# Patient Record
Sex: Male | Born: 1945 | Race: Black or African American | Hispanic: No | Marital: Married | Smoking: Former smoker
Health system: Southern US, Community
[De-identification: ages and names within clinical notes are randomized; demographics above are authoritative.]

## PROBLEM LIST (undated history)

## (undated) DIAGNOSIS — K922 Gastrointestinal hemorrhage, unspecified: Secondary | ICD-10-CM

## (undated) DIAGNOSIS — N179 Acute kidney failure, unspecified: Secondary | ICD-10-CM

## (undated) DIAGNOSIS — I4891 Unspecified atrial fibrillation: Secondary | ICD-10-CM

## (undated) DIAGNOSIS — R55 Syncope and collapse: Secondary | ICD-10-CM

## (undated) DIAGNOSIS — D649 Anemia, unspecified: Secondary | ICD-10-CM

## (undated) DIAGNOSIS — I10 Essential (primary) hypertension: Secondary | ICD-10-CM

## (undated) DIAGNOSIS — Z9289 Personal history of other medical treatment: Secondary | ICD-10-CM

## (undated) DIAGNOSIS — K7460 Unspecified cirrhosis of liver: Secondary | ICD-10-CM

## (undated) DIAGNOSIS — Z87898 Personal history of other specified conditions: Secondary | ICD-10-CM

## (undated) DIAGNOSIS — R0600 Dyspnea, unspecified: Secondary | ICD-10-CM

## (undated) DIAGNOSIS — I739 Peripheral vascular disease, unspecified: Secondary | ICD-10-CM

## (undated) DIAGNOSIS — I509 Heart failure, unspecified: Secondary | ICD-10-CM

## (undated) DIAGNOSIS — K920 Hematemesis: Secondary | ICD-10-CM

## (undated) DIAGNOSIS — K219 Gastro-esophageal reflux disease without esophagitis: Secondary | ICD-10-CM

## (undated) HISTORY — PX: COLONOSCOPY: SHX174

## (undated) HISTORY — PX: ESOPHAGOGASTRODUODENOSCOPY: SHX1529

---

## 2014-05-20 DIAGNOSIS — K922 Gastrointestinal hemorrhage, unspecified: Secondary | ICD-10-CM

## 2014-05-20 DIAGNOSIS — Z9289 Personal history of other medical treatment: Secondary | ICD-10-CM

## 2014-05-20 HISTORY — DX: Personal history of other medical treatment: Z92.89

## 2014-05-20 HISTORY — DX: Gastrointestinal hemorrhage, unspecified: K92.2

## 2015-11-21 ENCOUNTER — Emergency Department (HOSPITAL_COMMUNITY): Payer: Medicare Other

## 2015-11-21 ENCOUNTER — Encounter (HOSPITAL_COMMUNITY): Payer: Self-pay | Admitting: Vascular Surgery

## 2015-11-21 ENCOUNTER — Inpatient Hospital Stay (HOSPITAL_COMMUNITY)
Admission: EM | Admit: 2015-11-21 | Discharge: 2015-12-01 | DRG: 248 | Disposition: A | Discharge status: Home / Self Care | Payer: Medicare Other | Attending: Internal Medicine | Admitting: Internal Medicine

## 2015-11-21 ENCOUNTER — Observation Stay (HOSPITAL_COMMUNITY): Payer: Medicare Other

## 2015-11-21 DIAGNOSIS — I10 Essential (primary) hypertension: Secondary | ICD-10-CM | POA: Diagnosis present

## 2015-11-21 DIAGNOSIS — I519 Heart disease, unspecified: Secondary | ICD-10-CM | POA: Diagnosis not present

## 2015-11-21 DIAGNOSIS — F17210 Nicotine dependence, cigarettes, uncomplicated: Secondary | ICD-10-CM | POA: Diagnosis present

## 2015-11-21 DIAGNOSIS — R55 Syncope and collapse: Secondary | ICD-10-CM | POA: Diagnosis present

## 2015-11-21 DIAGNOSIS — E860 Dehydration: Secondary | ICD-10-CM | POA: Diagnosis present

## 2015-11-21 DIAGNOSIS — F1010 Alcohol abuse, uncomplicated: Secondary | ICD-10-CM | POA: Diagnosis present

## 2015-11-21 DIAGNOSIS — I480 Paroxysmal atrial fibrillation: Secondary | ICD-10-CM | POA: Diagnosis present

## 2015-11-21 DIAGNOSIS — E440 Moderate protein-calorie malnutrition: Secondary | ICD-10-CM | POA: Diagnosis present

## 2015-11-21 DIAGNOSIS — K920 Hematemesis: Secondary | ICD-10-CM | POA: Diagnosis not present

## 2015-11-21 DIAGNOSIS — S0101XA Laceration without foreign body of scalp, initial encounter: Secondary | ICD-10-CM | POA: Diagnosis present

## 2015-11-21 DIAGNOSIS — I739 Peripheral vascular disease, unspecified: Secondary | ICD-10-CM | POA: Diagnosis not present

## 2015-11-21 DIAGNOSIS — N179 Acute kidney failure, unspecified: Secondary | ICD-10-CM | POA: Diagnosis present

## 2015-11-21 DIAGNOSIS — R110 Nausea: Secondary | ICD-10-CM | POA: Diagnosis not present

## 2015-11-21 DIAGNOSIS — D500 Iron deficiency anemia secondary to blood loss (chronic): Secondary | ICD-10-CM | POA: Diagnosis present

## 2015-11-21 DIAGNOSIS — I472 Ventricular tachycardia: Secondary | ICD-10-CM | POA: Diagnosis not present

## 2015-11-21 DIAGNOSIS — Z955 Presence of coronary angioplasty implant and graft: Secondary | ICD-10-CM

## 2015-11-21 DIAGNOSIS — I4891 Unspecified atrial fibrillation: Secondary | ICD-10-CM | POA: Diagnosis not present

## 2015-11-21 DIAGNOSIS — K219 Gastro-esophageal reflux disease without esophagitis: Secondary | ICD-10-CM | POA: Diagnosis present

## 2015-11-21 DIAGNOSIS — S0191XA Laceration without foreign body of unspecified part of head, initial encounter: Secondary | ICD-10-CM | POA: Diagnosis present

## 2015-11-21 DIAGNOSIS — E876 Hypokalemia: Secondary | ICD-10-CM | POA: Diagnosis present

## 2015-11-21 DIAGNOSIS — Z6820 Body mass index (BMI) 20.0-20.9, adult: Secondary | ICD-10-CM

## 2015-11-21 DIAGNOSIS — I70219 Atherosclerosis of native arteries of extremities with intermittent claudication, unspecified extremity: Secondary | ICD-10-CM | POA: Diagnosis present

## 2015-11-21 DIAGNOSIS — F17200 Nicotine dependence, unspecified, uncomplicated: Secondary | ICD-10-CM | POA: Diagnosis present

## 2015-11-21 DIAGNOSIS — D72829 Elevated white blood cell count, unspecified: Secondary | ICD-10-CM

## 2015-11-21 DIAGNOSIS — K7030 Alcoholic cirrhosis of liver without ascites: Secondary | ICD-10-CM | POA: Diagnosis present

## 2015-11-21 DIAGNOSIS — I5021 Acute systolic (congestive) heart failure: Secondary | ICD-10-CM | POA: Diagnosis present

## 2015-11-21 DIAGNOSIS — I509 Heart failure, unspecified: Secondary | ICD-10-CM

## 2015-11-21 DIAGNOSIS — I110 Hypertensive heart disease with heart failure: Secondary | ICD-10-CM | POA: Diagnosis present

## 2015-11-21 DIAGNOSIS — Z7982 Long term (current) use of aspirin: Secondary | ICD-10-CM | POA: Diagnosis not present

## 2015-11-21 DIAGNOSIS — Z9861 Coronary angioplasty status: Secondary | ICD-10-CM

## 2015-11-21 DIAGNOSIS — I25110 Atherosclerotic heart disease of native coronary artery with unstable angina pectoris: Principal | ICD-10-CM | POA: Diagnosis present

## 2015-11-21 DIAGNOSIS — R609 Edema, unspecified: Secondary | ICD-10-CM

## 2015-11-21 DIAGNOSIS — K7460 Unspecified cirrhosis of liver: Secondary | ICD-10-CM | POA: Diagnosis present

## 2015-11-21 DIAGNOSIS — W1830XA Fall on same level, unspecified, initial encounter: Secondary | ICD-10-CM | POA: Diagnosis present

## 2015-11-21 DIAGNOSIS — I70213 Atherosclerosis of native arteries of extremities with intermittent claudication, bilateral legs: Secondary | ICD-10-CM | POA: Diagnosis present

## 2015-11-21 DIAGNOSIS — I2510 Atherosclerotic heart disease of native coronary artery without angina pectoris: Secondary | ICD-10-CM | POA: Diagnosis present

## 2015-11-21 DIAGNOSIS — K2970 Gastritis, unspecified, without bleeding: Secondary | ICD-10-CM | POA: Diagnosis present

## 2015-11-21 DIAGNOSIS — K3189 Other diseases of stomach and duodenum: Secondary | ICD-10-CM | POA: Diagnosis present

## 2015-11-21 DIAGNOSIS — I255 Ischemic cardiomyopathy: Secondary | ICD-10-CM | POA: Diagnosis present

## 2015-11-21 DIAGNOSIS — K279 Peptic ulcer, site unspecified, unspecified as acute or chronic, without hemorrhage or perforation: Secondary | ICD-10-CM | POA: Diagnosis present

## 2015-11-21 HISTORY — DX: Gastrointestinal hemorrhage, unspecified: K92.2

## 2015-11-21 HISTORY — DX: Syncope and collapse: R55

## 2015-11-21 HISTORY — DX: Unspecified atrial fibrillation: I48.91

## 2015-11-21 HISTORY — DX: Essential (primary) hypertension: I10

## 2015-11-21 HISTORY — DX: Hematemesis: K92.0

## 2015-11-21 HISTORY — DX: Personal history of other medical treatment: Z92.89

## 2015-11-21 HISTORY — DX: Personal history of other specified conditions: Z87.898

## 2015-11-21 HISTORY — DX: Gastro-esophageal reflux disease without esophagitis: K21.9

## 2015-11-21 HISTORY — DX: Peripheral vascular disease, unspecified: I73.9

## 2015-11-21 HISTORY — DX: Unspecified cirrhosis of liver: K74.60

## 2015-11-21 HISTORY — DX: Acute kidney failure, unspecified: N17.9

## 2015-11-21 LAB — HEPATIC FUNCTION PANEL
ALT: 26 U/L (ref 17–63)
AST: 41 U/L (ref 15–41)
Albumin: 3.4 g/dL — ABNORMAL LOW (ref 3.5–5.0)
Alkaline Phosphatase: 100 U/L (ref 38–126)
BILIRUBIN INDIRECT: 1.3 mg/dL — AB (ref 0.3–0.9)
Bilirubin, Direct: 0.3 mg/dL (ref 0.1–0.5)
TOTAL PROTEIN: 7.1 g/dL (ref 6.5–8.1)
Total Bilirubin: 1.6 mg/dL — ABNORMAL HIGH (ref 0.3–1.2)

## 2015-11-21 LAB — BASIC METABOLIC PANEL
Anion gap: 12 (ref 5–15)
BUN: 9 mg/dL (ref 6–20)
CALCIUM: 9.2 mg/dL (ref 8.9–10.3)
CO2: 24 mmol/L (ref 22–32)
CREATININE: 2.28 mg/dL — AB (ref 0.61–1.24)
Chloride: 100 mmol/L — ABNORMAL LOW (ref 101–111)
GFR calc Af Amer: 32 mL/min — ABNORMAL LOW (ref >60)
GFR, EST NON AFRICAN AMERICAN: 27 mL/min — AB (ref >60)
Glucose, Bld: 122 mg/dL — ABNORMAL HIGH (ref 65–99)
POTASSIUM: 3.3 mmol/L — AB (ref 3.5–5.1)
SODIUM: 136 mmol/L (ref 135–145)

## 2015-11-21 LAB — OCCULT BLOOD X 1 CARD TO LAB, STOOL: FECAL OCCULT BLD: POSITIVE — AB

## 2015-11-21 LAB — I-STAT TROPONIN, ED: TROPONIN I, POC: 0.02 ng/mL (ref 0.00–0.08)

## 2015-11-21 LAB — T4, FREE: FREE T4: 0.87 ng/dL (ref 0.61–1.12)

## 2015-11-21 LAB — CBC
HEMATOCRIT: 46.2 % (ref 39.0–52.0)
Hemoglobin: 15.8 g/dL (ref 13.0–17.0)
MCH: 33.4 pg (ref 26.0–34.0)
MCHC: 34.2 g/dL (ref 30.0–36.0)
MCV: 97.7 fL (ref 78.0–100.0)
PLATELETS: 214 10*3/uL (ref 150–400)
RBC: 4.73 MIL/uL (ref 4.22–5.81)
RDW: 13 % (ref 11.5–15.5)
WBC: 13.1 10*3/uL — AB (ref 4.0–10.5)

## 2015-11-21 LAB — URINE MICROSCOPIC-ADD ON

## 2015-11-21 LAB — URINALYSIS, ROUTINE W REFLEX MICROSCOPIC
Glucose, UA: 100 mg/dL — AB
Hgb urine dipstick: NEGATIVE
KETONES UR: 15 mg/dL — AB
NITRITE: POSITIVE — AB
PROTEIN: 100 mg/dL — AB
Specific Gravity, Urine: 1.026 (ref 1.005–1.030)
pH: 5 (ref 5.0–8.0)

## 2015-11-21 LAB — TROPONIN I: TROPONIN I: 0.03 ng/mL — AB (ref <0.03)

## 2015-11-21 LAB — TSH: TSH: 0.996 u[IU]/mL (ref 0.350–4.500)

## 2015-11-21 LAB — PROTIME-INR
INR: 1.15 (ref 0.00–1.49)
PROTHROMBIN TIME: 14.8 s (ref 11.6–15.2)

## 2015-11-21 LAB — APTT: APTT: 30 s (ref 24–37)

## 2015-11-21 MED ORDER — BOOST / RESOURCE BREEZE PO LIQD
1.0000 | Freq: Three times a day (TID) | ORAL | Status: DC
  Administered 2015-11-22 – 2015-12-01 (×19): 1 via ORAL
  Filled 2015-11-21 (×6): qty 1

## 2015-11-21 MED ORDER — THIAMINE HCL 100 MG/ML IJ SOLN: 100.0000 mg | Freq: Every day | INTRAMUSCULAR | Status: DC

## 2015-11-21 MED ORDER — SODIUM CHLORIDE 0.9 % IV SOLN
INTRAVENOUS | Status: DC
  Administered 2015-11-21: 20:00:00 via INTRAVENOUS

## 2015-11-21 MED ORDER — PANTOPRAZOLE SODIUM 40 MG PO TBEC
40.0000 mg | DELAYED_RELEASE_TABLET | Freq: Two times a day (BID) | ORAL | Status: DC
  Administered 2015-11-21 – 2015-12-01 (×20): 40 mg via ORAL
  Filled 2015-11-21 (×20): qty 1

## 2015-11-21 MED ORDER — ASPIRIN 81 MG PO CHEW
324.0000 mg | CHEWABLE_TABLET | Freq: Once | ORAL | Status: AC
  Administered 2015-11-21: 324 mg via ORAL
  Filled 2015-11-21: qty 4

## 2015-11-21 MED ORDER — LORAZEPAM 1 MG PO TABS: 1.0000 mg | ORAL_TABLET | Freq: Four times a day (QID) | ORAL | Status: AC | PRN

## 2015-11-21 MED ORDER — LORAZEPAM 2 MG/ML IJ SOLN
1.0000 mg | Freq: Four times a day (QID) | INTRAMUSCULAR | Status: AC | PRN
  Administered 2015-11-24: 1 mg via INTRAVENOUS
  Filled 2015-11-21 (×2): qty 1

## 2015-11-21 MED ORDER — ACETAMINOPHEN 500 MG PO TABS
1000.0000 mg | ORAL_TABLET | Freq: Once | ORAL | Status: AC
Start: 2015-11-21 — End: 2015-11-21
  Administered 2015-11-21: 1000 mg via ORAL
  Filled 2015-11-21: qty 2

## 2015-11-21 MED ORDER — VITAMIN B-1 100 MG PO TABS
100.0000 mg | ORAL_TABLET | Freq: Every day | ORAL | Status: DC
  Administered 2015-11-21 – 2015-12-01 (×11): 100 mg via ORAL
  Filled 2015-11-21 (×11): qty 1

## 2015-11-21 MED ORDER — METOPROLOL TARTRATE 25 MG PO TABS
25.0000 mg | ORAL_TABLET | Freq: Two times a day (BID) | ORAL | Status: DC
  Administered 2015-11-21 – 2015-11-26 (×10): 25 mg via ORAL
  Filled 2015-11-21 (×10): qty 1

## 2015-11-21 MED ORDER — POTASSIUM CHLORIDE CRYS ER 20 MEQ PO TBCR
40.0000 meq | EXTENDED_RELEASE_TABLET | Freq: Once | ORAL | Status: AC
  Administered 2015-11-21: 40 meq via ORAL
  Filled 2015-11-21: qty 2

## 2015-11-21 MED ORDER — ADULT MULTIVITAMIN W/MINERALS CH
1.0000 | ORAL_TABLET | Freq: Every day | ORAL | Status: DC
  Administered 2015-11-21 – 2015-12-01 (×11): 1 via ORAL
  Filled 2015-11-21 (×11): qty 1

## 2015-11-21 MED ORDER — AMLODIPINE BESYLATE 10 MG PO TABS
10.0000 mg | ORAL_TABLET | Freq: Every day | ORAL | Status: DC
  Administered 2015-11-22 – 2015-12-01 (×9): 10 mg via ORAL
  Filled 2015-11-21 (×10): qty 1

## 2015-11-21 MED ORDER — LIDOCAINE-EPINEPHRINE 1 %-1:100000 IJ SOLN
20.0000 mL | Freq: Once | INTRAMUSCULAR | Status: AC
  Administered 2015-11-21: 20 mL via INTRADERMAL
  Filled 2015-11-21: qty 1

## 2015-11-21 MED ORDER — ONDANSETRON HCL 4 MG/2ML IJ SOLN: 4.0000 mg | Freq: Four times a day (QID) | INTRAMUSCULAR | Status: DC | PRN

## 2015-11-21 MED ORDER — FOLIC ACID 1 MG PO TABS
1.0000 mg | ORAL_TABLET | Freq: Every day | ORAL | Status: DC
  Administered 2015-11-21 – 2015-12-01 (×11): 1 mg via ORAL
  Filled 2015-11-21 (×11): qty 1

## 2015-11-21 MED ORDER — ACETAMINOPHEN 325 MG PO TABS
650.0000 mg | ORAL_TABLET | ORAL | Status: DC | PRN
  Administered 2015-11-22 – 2015-11-23 (×3): 650 mg via ORAL
  Filled 2015-11-21 (×3): qty 2

## 2015-11-21 MED ORDER — SODIUM CHLORIDE 0.9 % IV BOLUS (SEPSIS)
1000.0000 mL | Freq: Once | INTRAVENOUS | Status: AC
  Administered 2015-11-21: 1000 mL via INTRAVENOUS

## 2015-11-21 NOTE — ED Notes (Signed)
Pt reports to the ED for eval of multiple syncopal episodes. Pt was walking home from the grocery store when he had a syncopal episode outside of his apt. He fell to the ground striking his posterior head which resulted in a 10 mc laceration. Dressing in place and bleeding controlled at this time. Upon EMS arrival patient was pale and diaphoretic. He initially refused transport however when they stood him up he had another syncopal episode. He was caught and slowly lowered to the ground. Pts wife states N/V/D x the past several days. As well as some decreased PO intake. Pt complaining of some neck pain en route and towel in place to hold alignment. Pt denies any numbness, tingling, or paralysis. Was incontient of stool en route. 12 lead showed a fib. Pt denies any blood thinner use. Pt A&Ox4, resp e/u, and skin warm and dry.

## 2015-11-21 NOTE — H&P (Signed)
History and Physical    Derrick Ramos ZOX:[id number] DOB: April 17, 1946 DOA: 11/21/2015  PCP: The patient has a PCP in the [facility] system  Patient coming from: Home  Chief Complaint: Recurrent syncope, head laceration  HPI: Derrick Ramos is a 70 y.o. gentleman with a history of cirrhosis (self-reported), daily EtOH use, active tobacco use, PVD, and HTN who presents to the ED accompanied by his wife and daughter who has had recurrent syncopal episodes.  The episode today resulted in head trauma and laceration requiring staples in the ED.  EKG also shows atrial fibrillation (rate controlled), which is new for him.  He denies chest pain, pressure, or tightness.  No shortness of breath.  He has had nausea and vomiting and reports dark coffee ground emesis over the past two days.  He has had 3-4 episodes.  He did not tell his family. He denies melena but says that his stools have been loose.  No abdominal pain, fullness, or new distention.  No leg swelling.    ED Course: Staples placed in head laceration.  CT of head and cervical spine did not show acute abnormalities that would be attributed to his head trauma.  He has evidence of mild hypokalemia, a leukocytosis of unclear clinical significance, and elevated creatinine (though BUN is normal).  Hospitalist asked to admit.  Review of Systems: As per HPI otherwise 10 point review of systems negative.    Past Medical History  Diagnosis Date  . Hypertension   . 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 Colonoscopy    . Esophagogastroduodenoscopy       reports that he has been smoking Cigarettes.  He has been smoking about 0.50 packs per day. He has never used smokeless tobacco. He reports that he drinks alcohol. He reports that he does not use illicit drugs. Daily  tobacco and EtOH use. Denies history of EtOH withdrawal symptoms. Denies illicit drug use. Relocated to this area from [place] almost two years ago. He is married and has at least one daughter.  No Known Allergies  Family History: One sister has DM and CKD. Mother died of complications related to cancer; type unknown. One brother died of complications related to cancer; type unknown.  Prior to Admission medications   Medication Sig Start Date End Date Taking? Authorizing Provider  amLODipine (NORVASC) 10 MG tablet Take 1 tablet by mouth daily. 10/12/15  Yes Historical Provider, MD  loratadine (CLARITIN) 10 MG tablet Take 1 tablet by mouth daily. 10/12/15 01/10/16 Yes Historical Provider, MD  metoprolol tartrate (LOPRESSOR) 25 MG tablet Take 1 tablet by mouth 2 (two) times daily. 10/12/15  Yes Historical Provider, MD  ranitidine (ZANTAC) 150 MG capsule Take 1 capsule by mouth 2 (two) times daily.   Yes Historical Provider, MD    Physical Exam: Filed Vitals:   11/21/15 1645 11/21/15 1700 11/21/15 1715 11/21/15 1749  BP: 126/77 118/78 108/87 128/85  Pulse: 73 78 79 66  Temp:    98.3 F (36.8 C)  TempSrc:    Oral  Resp: 14 17 16 18   Height:    5\' 6"  (1.676 m)  Weight:    56.3 kg (124 lb 1.9 oz)  SpO2: 100% 100% 100% 100%      Constitutional: NAD, calm, comfortable Filed Vitals:   11/21/15 1645 11/21/15 1700 11/21/15  1715 11/21/15 1749  BP: 126/77 118/78 108/87 128/85  Pulse: 73 78 79 66  Temp:    98.3 F (36.8 C)  TempSrc:    Oral  Resp: Height:     (1.676 m)  Weight:    56.3 kg (124 lb 1.9 oz)  SpO2: 100% 100% 100% 100%   Eyes: PERRL, lids and conjunctivae normal ENMT: Mucous membranes are dry. Posterior pharynx clear of any exudate or lesions. Neck: normal appearance, supple, no masses Respiratory: clear to auscultation bilaterally, no wheezing, no crackles. Normal respiratory effort. No accessory muscle use.  Cardiovascular: Irregularly irregular but  rate controlled.  No murmurs / rubs / gallops. No extremity edema. 2+ pedal pulses. No carotid bruits.  GI: abdomen is soft and compressible.  Mildly distended but he says this is his baseline.  No tenderness.  No guarding.  No masses palpated.  Bowel sounds are present. Musculoskeletal:  No joint deformity in upper and lower extremities. Good ROM, no contractures. Normal muscle tone.  Skin: no rashes, warm and dry Neurologic: No focal deficits. Psychiatric: Normal judgment and insight. Alert and oriented x 3. Normal mood.     Labs on Admission: I have personally reviewed following labs and imaging studies  CBC:  Recent Labs Lab 11/21/15 1422  WBC 13.1*  HGB 15.8  HCT 46.2  MCV 97.7  PLT 214   Basic Metabolic Panel:  Recent Labs Lab 11/21/15 1422  NA 136  K 3.3*  CL 100*  CO2 24  GLUCOSE 122*  BUN 9  CREATININE 2.28*  CALCIUM 9.2   GFR: Estimated Creatinine Clearance: 23.7 mL/min (by C-G formula based on Cr of 2.28).   Radiological Exams on Admission: Dg Chest 2 View  11/21/2015  CLINICAL DATA:  Multiple syncopal episodes. Patient fell, striking head. Initial encounter. EXAM: CHEST  2 VIEW COMPARISON:  None. FINDINGS: 1513 hours. The heart is mildly enlarged. There is aortic tortuosity. The lungs are mildly hyperinflated but clear. There is some calcification along the right hemidiaphragm. No pleural effusion or pneumothorax is seen. Mild degenerative changes are present throughout the spine. IMPRESSION: No active cardiopulmonary process or acute posttraumatic findings demonstrated. Mild cardiomegaly. Electronically Signed   By: Carey Bullocks M.D.   On: 11/21/2015 15:12   Ct Head Wo Contrast  11/21/2015  CLINICAL DATA:  Fall, neck pain EXAM: CT HEAD WITHOUT CONTRAST CT CERVICAL SPINE WITHOUT CONTRAST TECHNIQUE: Multidetector CT imaging of the head and cervical spine was performed following the standard protocol without intravenous contrast. Multiplanar CT image  reconstructions of the cervical spine were also generated. COMPARISON:  None. FINDINGS: CT HEAD FINDINGS No skull fracture is noted. Paranasal sinuses and mastoid air cells are unremarkable. Atherosclerotic calcifications of carotid siphon are noted. No intracranial hemorrhage, mass effect or midline shift. Moderate cerebral atrophy. Mild periventricular and patchy subcortical white matter decreased attenuation probable due to chronic small vessel ischemic changes. No acute cortical infarction. No mass lesion is noted on this unenhanced scan. CT CERVICAL SPINE FINDINGS Axial images of the cervical spine shows no acute fracture or subluxation. Atherosclerotic calcifications of vertebral arteries are noted. Computer processed images shows no acute fracture or subluxation. Mild degenerative changes C1-C2 articulation. Mild posterior disc bulge at C3-C4 level. Mild anterior spurring lower endplate of C4 and C5 vertebral body. Mild disc space flattening with minimal posterior disc bulge as at C6-C7 level. No prevertebral soft tissue swelling. Cervical airway is patent. There is no pneumothorax in visualized lung  apices. IMPRESSION: 1. No acute intracranial abnormality. There is moderate cerebral atrophy. Mild periventricular and patchy subcortical white matter decreased attenuation probable due to chronic small vessel ischemic changes. 2. No cervical spine acute fracture or subluxation. Mild degenerative changes as described above. Electronically Signed   By: Natasha Mead M.D.   On: 11/21/2015 15:09   Ct Cervical Spine Wo Contrast  11/21/2015  CLINICAL DATA:  Fall, neck pain EXAM: CT HEAD WITHOUT CONTRAST CT CERVICAL SPINE WITHOUT CONTRAST TECHNIQUE: Multidetector CT imaging of the head and cervical spine was performed following the standard protocol without intravenous contrast. Multiplanar CT image reconstructions of the cervical spine were also generated. COMPARISON:  None. FINDINGS: CT HEAD FINDINGS No skull fracture  is noted. Paranasal sinuses and mastoid air cells are unremarkable. Atherosclerotic calcifications of carotid siphon are noted. No intracranial hemorrhage, mass effect or midline shift. Moderate cerebral atrophy. Mild periventricular and patchy subcortical white matter decreased attenuation probable due to chronic small vessel ischemic changes. No acute cortical infarction. No mass lesion is noted on this unenhanced scan. CT CERVICAL SPINE FINDINGS Axial images of the cervical spine shows no acute fracture or subluxation. Atherosclerotic calcifications of vertebral arteries are noted. Computer processed images shows no acute fracture or subluxation. Mild degenerative changes C1-C2 articulation. Mild posterior disc bulge at C3-C4 level. Mild anterior spurring lower endplate of C4 and C5 vertebral body. Mild disc space flattening with minimal posterior disc bulge as at C6-C7 level. No prevertebral soft tissue swelling. Cervical airway is patent. There is no pneumothorax in visualized lung apices. IMPRESSION: 1. No acute intracranial abnormality. There is moderate cerebral atrophy. Mild periventricular and patchy subcortical white matter decreased attenuation probable due to chronic small vessel ischemic changes. 2. No cervical spine acute fracture or subluxation. Mild degenerative changes as described above. Electronically Signed   By: Natasha Mead M.D.   On: 11/21/2015 15:09    EKG: Independently reviewed. Rate controlled atrial fibrillation.  Assessment/Plan Principal Problem:   Syncope and collapse Active Problems:   Atrial fibrillation (HCC)   Hypokalemia   HTN (hypertension)   Hepatic cirrhosis (HCC)   Hematemesis   AKI (acute kidney injury) (HCC)   Laceration of head   Tobacco use disorder   Alcohol abuse   Syncope      Syncope in the setting of AKI, new onset atrial fibrillation, self-reported coffee ground emesis (with history of GI bleed) and daily EtOH use (likely contributing to  relative dehydration) --Monitor on telemetry --Will check carotid dopplers since he has a history of PVD --Document orthostatic vital signs --Gentle hydration  Atrial fibrillation, CHADS-Vasc score at least 3 for age, PAD, and HTN.  Anticoagulation deferred at this point due to concern for occult GI bleeding.  Candidacy for long term anticoagulation will need to be assessed (active EtOH use, concerned about medical compliance, history of GI bleeding). --Monitor on telemetry --Serial troponin --Thyroid function tests --Complete echo in the AM --Will need cardiology referral  AKI --Hydrate and repeat BMP in the AM --Renal ultrasound --U/A pending  HTN --Continue amlodipine, metoprolol for now  Self-reported coffee ground emesis with history of GI bleeding --Hgb normal upon presentation (though this could be some component of hemoconcentration) --Will monitor H/H trend after hydration --Monitor for signs of active blood loss --Oral BID PPI for now --Stool heme occult --Clear liquid diet for now --Anticipate GI consult for the AM  Self-reported history of cirrhosis --Will check LFTs and coags now  Daily tobacco use --Smoking cessation counseling  Daily EtOH use --CIWA protocol --MVI, thiamine, folate supplements    DVT prophylaxis: SCDs due to risk for bleeding Code Status: FULL Family Communication: Wife and daughter present in the ED at time of admission Disposition Plan: Home when ready for discharge Consults called: NONE Admission status: Observation, telemetry  TIME SPENT: 60 minutes  Jerene Bears MD [facility] Pager [phone]  If 7PM-7AM, please contact night-coverage [email or web address] Password [email or web address]  11/21/2015, 6:08 PM

## 2015-11-21 NOTE — ED Provider Notes (Signed)
CSN: [id number]     Arrival date & time 11/21/15  1354 History   First MD Initiated Contact with Patient 11/21/15 1356     Chief Complaint  Patient presents with  . Loss of Consciousness     (Consider location/radiation/quality/duration/timing/severity/associated sxs/prior Treatment) Patient is a 70 y.o. male presenting with syncope. The history is provided by the patient.  Loss of Consciousness Episode history:  Multiple Most recent episode:  Today Duration:  2 seconds Timing:  Constant Progression:  Unchanged Chronicity:  New Context: exertion and standing up   Witnessed: yes   Relieved by:  Lying down Worsened by:  Nothing tried Ineffective treatments:  None tried Associated symptoms: no chest pain, no confusion, no fever, no headaches, no palpitations, no shortness of breath and no vomiting    70 yo M With a chief complaint of syncopal BP. This happened twice a day. The first event was exertional he was walking to his front door when he suddenly dropped. He does not remember any presyncopal events. When he awoke to EMS he tried at home that he didn't need to come to the hospital when he tried to stand he then passed out again. This was a witnessed event and he does not endorse any chest pain shortness breath or headache prior to the events. He did hit his head when he fell during the first syncopal event. He has a history of hypertension and peripheral vascular disease but denies other medical history.  Past Medical History  Diagnosis Date  . Hypertension   . GERD (gastroesophageal reflux disease)    Past Surgical History  Procedure Laterality Date  . Colonoscopy    . Esophagogastroduodenoscopy     No family history on file. Social History  Substance Use Topics  . Smoking status: Current Every Day Smoker -- 0.50 packs/day    Types: Cigarettes  . Smokeless tobacco: Never Used  . Alcohol Use: Yes     Comment: daily, 2 - 6 pack tall cans daily beer and gallon wine 2-3 days     Review of Systems  Constitutional: Negative for fever and chills.  HENT: Negative for congestion and facial swelling.   Eyes: Negative for discharge and visual disturbance.  Respiratory: Negative for shortness of breath.   Cardiovascular: Positive for syncope. Negative for chest pain and palpitations.  Gastrointestinal: Negative for vomiting, abdominal pain and diarrhea.  Musculoskeletal: Negative for myalgias and arthralgias.  Skin: Negative for color change and rash.  Neurological: Positive for syncope. Negative for tremors and headaches.  Psychiatric/Behavioral: Negative for confusion and dysphoric mood.      Allergies  Review of patient's allergies indicates not on file.  Home Medications   Prior to Admission medications   Not on File   BP 135/89 mmHg  Pulse 87  Temp(Src) 98.2 F (36.8 C) (Oral)  Resp 21  SpO2 99% Physical Exam  Constitutional: He is oriented to person, place, and time. He appears well-developed and well-nourished.  HENT:  Head: Normocephalic.  8cm lac to posterior occiput  Eyes: EOM are normal. Pupils are equal, round, and reactive to light.  Neck: Normal range of motion. Neck supple. No JVD present.  Cardiovascular: Normal rate and regular rhythm.  Exam reveals no gallop and no friction rub.   No murmur heard. Pulmonary/Chest: No respiratory distress. He has no wheezes.  Abdominal: He exhibits no distension. There is no tenderness. There is no rebound and no guarding.  Musculoskeletal: Normal range of motion.  Neurological: He is alert  and oriented to person, place, and time.  Skin: No rash noted. No pallor.  Psychiatric: He has a normal mood and affect. His behavior is normal.  Nursing note and vitals reviewed.   ED Course  Procedures (including critical care time) Labs Review Labs Reviewed  BASIC METABOLIC PANEL - Abnormal; Notable for the following:    Potassium 3.3 (*)    Chloride 100 (*)    Glucose, Bld 122 (*)    Creatinine,  Ser 2.28 (*)    GFR calc non Af Amer 27 (*)    GFR calc Af Amer 32 (*)    All other components within normal limits  CBC - Abnormal; Notable for the following:    WBC 13.1 (*)    All other components within normal limits  URINALYSIS, ROUTINE W REFLEX MICROSCOPIC (NOT AT [facility])  I-STAT TROPOININ, ED    Imaging Review No results found. I have personally reviewed and evaluated these images and lab results as part of my medical decision-making.   EKG Interpretation   Date/Time:  Tuesday November 21 2015 14:06:13 EDT Ventricular Rate:  80 PR Interval:    QRS Duration: 92 QT Interval:  398 QTC Calculation: 460 R Axis:   -34 Text Interpretation:  Atrial fibrillation Inferior infarct, old Probable  anterior infarct, age indeterminate No old tracing to compare Confirmed by  Curlee Bogan MD, DANIEL [phone]) on 11/21/2015 2:13:24 PM      MDM   Final diagnoses:  Syncope and collapse    70 yo M with a chief complaint of a syncopal event. This was exertional. Patient has a large laceration to his occiput that I will repair with staples. ECG with flipped T waves in inferior and lateral leads. Patient is new to our system and has no old EKGs. Will admit.  The patients results and plan were reviewed and discussed.   Any x-rays performed were independently reviewed by myself.   Differential diagnosis were considered with the presenting HPI.  Medications  amLODipine (NORVASC) tablet 10 mg (not administered)  metoprolol tartrate (LOPRESSOR) tablet 25 mg (25 mg Oral Given 11/21/15 2138)  pantoprazole (PROTONIX) EC tablet 40 mg (40 mg Oral Given 11/21/15 2138)  acetaminophen (TYLENOL) tablet 650 mg (650 mg Oral Given 11/22/15 0549)  ondansetron (ZOFRAN) injection 4 mg (not administered)  0.9 %  sodium chloride infusion ( Intravenous New Bag/Given 11/21/15 1942)  LORazepam (ATIVAN) tablet 1 mg (not administered)    Or  LORazepam (ATIVAN) injection 1 mg (not administered)  thiamine (VITAMIN B-1) tablet 100 mg  (100 mg Oral Given 11/21/15 2002)    Or  thiamine (B-1) injection 100 mg ( Intravenous See Alternative 11/21/15 2002)  folic acid (FOLVITE) tablet 1 mg (1 mg Oral Given 11/21/15 2002)  multivitamin with minerals tablet 1 tablet (1 tablet Oral Given 11/21/15 2001)  feeding supplement (BOOST / RESOURCE BREEZE) liquid 1 Container (not administered)  aspirin chewable tablet 324 mg (324 mg Oral Given 11/21/15 1423)  sodium chloride 0.9 % bolus 1,000 mL (0 mLs Intravenous Stopped 11/21/15 1520)  acetaminophen (TYLENOL) tablet 1,000 mg (1,000 mg Oral Given 11/21/15 1521)  lidocaine-EPINEPHrine (XYLOCAINE W/EPI) 1 %-1:100000 (with pres) injection 20 mL (20 mLs Intradermal Given 11/21/15 1521)  potassium chloride SA (K-DUR,KLOR-CON) CR tablet 40 mEq (40 mEq Oral Given 11/21/15 2002)    Filed Vitals:   11/21/15 1749 11/21/15 2157 11/22/15 0043 11/22/15 0621  BP: 128/85 123/83 164/79 146/89  Pulse: 66 101 71 62  Temp: 98.3 F (36.8 C) 98.1  F (36.7 C) 98.9 F (37.2 C) 98.5 F (36.9 C)  TempSrc: Oral Oral Oral Oral  Resp: 18 18 18 18   Height: 5\' 6"  (1.676 m)     Weight: 124 lb 1.9 oz (56.3 kg)   126 lb 15.8 oz (57.6 kg)  SpO2: 100% 100% 100% 100%    Final diagnoses:  Syncope and collapse  Syncope  AKI (acute kidney injury) (HCC)  Syncope  AKI (acute kidney injury) (HCC)     Melene Plan, DO 11/22/15 0175

## 2015-11-21 NOTE — Progress Notes (Signed)
Patient had small liq. Brown stool that was sent to lab earlier to check for blood. Results are postive. Text page Maren Reamer N.P. To make aware.

## 2015-11-22 ENCOUNTER — Observation Stay (HOSPITAL_BASED_OUTPATIENT_CLINIC_OR_DEPARTMENT_OTHER): Payer: Medicare Other

## 2015-11-22 ENCOUNTER — Observation Stay (HOSPITAL_COMMUNITY): Payer: Medicare Other

## 2015-11-22 DIAGNOSIS — I472 Ventricular tachycardia: Secondary | ICD-10-CM | POA: Diagnosis not present

## 2015-11-22 DIAGNOSIS — F17210 Nicotine dependence, cigarettes, uncomplicated: Secondary | ICD-10-CM | POA: Diagnosis not present

## 2015-11-22 DIAGNOSIS — E440 Moderate protein-calorie malnutrition: Secondary | ICD-10-CM | POA: Diagnosis not present

## 2015-11-22 DIAGNOSIS — Z6820 Body mass index (BMI) 20.0-20.9, adult: Secondary | ICD-10-CM | POA: Diagnosis not present

## 2015-11-22 DIAGNOSIS — K7030 Alcoholic cirrhosis of liver without ascites: Secondary | ICD-10-CM | POA: Diagnosis not present

## 2015-11-22 DIAGNOSIS — K2970 Gastritis, unspecified, without bleeding: Secondary | ICD-10-CM | POA: Diagnosis not present

## 2015-11-22 DIAGNOSIS — E860 Dehydration: Secondary | ICD-10-CM | POA: Diagnosis not present

## 2015-11-22 DIAGNOSIS — I25110 Atherosclerotic heart disease of native coronary artery with unstable angina pectoris: Secondary | ICD-10-CM | POA: Diagnosis not present

## 2015-11-22 DIAGNOSIS — R55 Syncope and collapse: Secondary | ICD-10-CM | POA: Diagnosis present

## 2015-11-22 DIAGNOSIS — K3189 Other diseases of stomach and duodenum: Secondary | ICD-10-CM | POA: Diagnosis not present

## 2015-11-22 DIAGNOSIS — N179 Acute kidney failure, unspecified: Secondary | ICD-10-CM | POA: Diagnosis not present

## 2015-11-22 DIAGNOSIS — W1830XA Fall on same level, unspecified, initial encounter: Secondary | ICD-10-CM | POA: Diagnosis not present

## 2015-11-22 DIAGNOSIS — I4891 Unspecified atrial fibrillation: Secondary | ICD-10-CM | POA: Diagnosis not present

## 2015-11-22 DIAGNOSIS — I5021 Acute systolic (congestive) heart failure: Secondary | ICD-10-CM | POA: Diagnosis not present

## 2015-11-22 DIAGNOSIS — E876 Hypokalemia: Secondary | ICD-10-CM | POA: Diagnosis not present

## 2015-11-22 DIAGNOSIS — I255 Ischemic cardiomyopathy: Secondary | ICD-10-CM | POA: Diagnosis not present

## 2015-11-22 DIAGNOSIS — Z7982 Long term (current) use of aspirin: Secondary | ICD-10-CM | POA: Diagnosis not present

## 2015-11-22 DIAGNOSIS — F1010 Alcohol abuse, uncomplicated: Secondary | ICD-10-CM | POA: Diagnosis not present

## 2015-11-22 DIAGNOSIS — S0101XA Laceration without foreign body of scalp, initial encounter: Secondary | ICD-10-CM | POA: Diagnosis not present

## 2015-11-22 DIAGNOSIS — I110 Hypertensive heart disease with heart failure: Secondary | ICD-10-CM | POA: Diagnosis not present

## 2015-11-22 DIAGNOSIS — D500 Iron deficiency anemia secondary to blood loss (chronic): Secondary | ICD-10-CM | POA: Diagnosis not present

## 2015-11-22 DIAGNOSIS — K219 Gastro-esophageal reflux disease without esophagitis: Secondary | ICD-10-CM | POA: Diagnosis not present

## 2015-11-22 DIAGNOSIS — I480 Paroxysmal atrial fibrillation: Secondary | ICD-10-CM | POA: Diagnosis not present

## 2015-11-22 DIAGNOSIS — I70213 Atherosclerosis of native arteries of extremities with intermittent claudication, bilateral legs: Secondary | ICD-10-CM | POA: Diagnosis not present

## 2015-11-22 LAB — HEMOGLOBIN AND HEMATOCRIT, BLOOD
HCT: 42.8 % (ref 39.0–52.0)
HEMATOCRIT: 42.5 % (ref 39.0–52.0)
HEMOGLOBIN: 14.2 g/dL (ref 13.0–17.0)
Hemoglobin: 14.2 g/dL (ref 13.0–17.0)

## 2015-11-22 LAB — COMPREHENSIVE METABOLIC PANEL
ALBUMIN: 3 g/dL — AB (ref 3.5–5.0)
ALK PHOS: 95 U/L (ref 38–126)
ALT: 23 U/L (ref 17–63)
ANION GAP: 8 (ref 5–15)
AST: 31 U/L (ref 15–41)
BUN: 9 mg/dL (ref 6–20)
CALCIUM: 8.9 mg/dL (ref 8.9–10.3)
CO2: 26 mmol/L (ref 22–32)
Chloride: 105 mmol/L (ref 101–111)
Creatinine, Ser: 1.21 mg/dL (ref 0.61–1.24)
GFR calc Af Amer: >60 mL/min (ref >60)
GFR calc non Af Amer: 58 mL/min — ABNORMAL LOW (ref >60)
GLUCOSE: 84 mg/dL (ref 65–99)
POTASSIUM: 4.6 mmol/L (ref 3.5–5.1)
SODIUM: 139 mmol/L (ref 135–145)
Total Bilirubin: 1.7 mg/dL — ABNORMAL HIGH (ref 0.3–1.2)
Total Protein: 6.5 g/dL (ref 6.5–8.1)

## 2015-11-22 LAB — ECHOCARDIOGRAM COMPLETE
CHL CUP STROKE VOLUME: 40 mL
FS: 25 % — AB (ref 28–44)
HEIGHTINCHES: 66 in
IV/PV OW: 0.99
LA ID, A-P, ES: 34 mm
LA vol A4C: 57.6 ml
LA vol: 63.3 mL
LADIAMINDEX: 2.06 cm/m2
LAVOLIN: 38.4 mL/m2
LEFT ATRIUM END SYS DIAM: 34 mm
LV PW d: 9.52 mm — AB (ref 0.6–1.1)
LV SIMPSON'S DISK: 44
LV dias vol: 93 mL (ref 62–150)
LV sys vol index: 32 mL/m2
LVDIAVOLIN: 56 mL/m2
LVOT area: 3.46 cm2
LVOT diameter: 21 mm
LVSYSVOL: 52 mL (ref 21–61)
TAPSE: 21.7 mm
WEIGHTICAEL: 2031.76 [oz_av]

## 2015-11-22 LAB — HEMOGLOBIN A1C
Hgb A1c MFr Bld: 4.7 % — ABNORMAL LOW (ref 4.8–5.6)
MEAN PLASMA GLUCOSE: 88 mg/dL

## 2015-11-22 LAB — CBC
HCT: 43.4 % (ref 39.0–52.0)
HEMOGLOBIN: 14.5 g/dL (ref 13.0–17.0)
MCH: 32.5 pg (ref 26.0–34.0)
MCHC: 33.4 g/dL (ref 30.0–36.0)
MCV: 97.3 fL (ref 78.0–100.0)
Platelets: 196 10*3/uL (ref 150–400)
RBC: 4.46 MIL/uL (ref 4.22–5.81)
RDW: 13.2 % (ref 11.5–15.5)
WBC: 10.4 10*3/uL (ref 4.0–10.5)

## 2015-11-22 LAB — TROPONIN I
TROPONIN I: 0.03 ng/mL — AB (ref <0.03)
TROPONIN I: 0.04 ng/mL — AB (ref <0.03)

## 2015-11-22 NOTE — Progress Notes (Signed)
Progress Note    Derrick Ramos  ZOX:[id number] DOB: 1946/04/16  DOA: 11/21/2015 PCP: Pcp Not In System    Brief Narrative:   Derrick Ramos is an 70 y.o. male gentleman with a history of cirrhosis (self-reported), daily EtOH use, active tobacco use, PVD, and HTN who presents to the ED accompanied by his wife and daughter who has had recurrent syncopal episodes.   Assessment/Plan:   Principal Problem:   Syncope and collapse Active Problems:   Atrial fibrillation (HCC)   Hypokalemia   HTN (hypertension)   Hepatic cirrhosis (HCC)   Hematemesis   AKI (acute kidney injury) (HCC)   Laceration of head   Tobacco use disorder   Alcohol abuse   Syncope   Malnutrition of moderate degree  Syncope possibly from ETOH abuse: ? Coffee ground emesis.  Hemoglobin stable.  GI consulted for further eval.    Paroxysmal atrial fibrillation: Serial troponins Echo pending.  [place] score is 3.  But because of his ETOH abuse, not a good candidate for anticoagulation.     Aki: Improving with IV fluids.  Continue the IV fluids.  Recheck renal parameters in am.    Hypertension: well controlled.    Cirrhosis: Liver function panel wnl.    etoh abuse: On CIWA protocol.   Left rib pain: Rib series ordered.  Pain control .  Avoid NSAIDS.     Family Communication/Anticipated D/C date and plan/Code Status   DVT prophylaxis: Lovenox ordered. Code Status: Full Code.  Family Communication: none at bedside.  Disposition Plan: pending further investigation.   Medical Consultants:    Gastroenterology.    Procedures:    Anti-Infectives:   Anti-infectives    None      Subjective:    Derrick Ramos reports pain in the left rib area.   Objective:    Filed Vitals:   11/22/15 0621 11/22/15 1041 11/22/15 1200 11/22/15 1744  BP: 146/89 130/83 142/77 143/85  Pulse: 62 89 73 82  Temp: 98.5 F (36.9 C)  98.7 F (37.1 C) 98.5 F (36.9 C)  TempSrc: Oral  Oral Oral    Resp: 18  18 18   Height:      Weight: 57.6 kg (126 lb 15.8 oz)     SpO2: 100%  99% 100%    Intake/Output Summary (Last 24 hours) at 11/22/15 1748 Last data filed at 11/22/15 1745  Gross per 24 hour  Intake   1080 ml  Output    625 ml  Net    455 ml   Filed Weights   11/21/15 1749 11/22/15 0621  Weight: 56.3 kg (124 lb 1.9 oz) 57.6 kg (126 lb 15.8 oz)    Exam: General exam: Appears calm and comfortable.  Respiratory system: Clear to auscultation. Respiratory effort normal. Cardiovascular system: S1 & S2 heard, RRR. No JVD,  rubs, gallops or clicks. No murmurs. Gastrointestinal system:tender abdomen, soft, non distended. Pain in the left rib area Central nervous system: Alert and oriented. No focal neurological deficits. Extremities: No clubbing, edema, or cyanosis. Skin: No rashes, lesions or ulcers Psychiatry: Judgement and insight appear normal. Mood & affect appropriate.   Data Reviewed:   I have personally reviewed following labs and imaging studies:  Labs: Basic Metabolic Panel:  Recent Labs Lab 11/21/15 1422 11/22/15 0537  NA 136 139  K 3.3* 4.6  CL 100* 105  CO2 24 26  GLUCOSE 122* 84  BUN 9 9  CREATININE 2.28* 1.21  CALCIUM 9.2 8.9  GFR Estimated Creatinine Clearance: 45.6 mL/min (by C-G formula based on Cr of 1.21). Liver Function Tests:  Recent Labs Lab 11/21/15 1818 11/22/15 0537  AST 41 31  ALT 26 23  ALKPHOS 100 95  BILITOT 1.6* 1.7*  PROT 7.1 6.5  ALBUMIN 3.4* 3.0*   No results for input(s): LIPASE, AMYLASE in the last 168 hours. No results for input(s): AMMONIA in the last 168 hours. Coagulation profile  Recent Labs Lab 11/21/15 1831  INR 1.15    CBC:  Recent Labs Lab 11/21/15 1422 11/21/15 2334 11/22/15 0537  WBC 13.1*  --  10.4  HGB 15.8 14.2 14.5  HCT 46.2 42.8 43.4  MCV 97.7  --  97.3  PLT 214  --  196   Cardiac Enzymes:  Recent Labs Lab 11/21/15 1818 11/21/15 2331 11/22/15 0537  TROPONINI 0.03* 0.03*  0.04*   BNP (last 3 results) No results for input(s): PROBNP in the last 8760 hours. CBG: No results for input(s): GLUCAP in the last 168 hours. D-Dimer: No results for input(s): DDIMER in the last 72 hours. Hgb A1c:  Recent Labs  11/21/15 1823  HGBA1C 4.7*   Lipid Profile: No results for input(s): CHOL, HDL, LDLCALC, TRIG, CHOLHDL, LDLDIRECT in the last 72 hours. Thyroid function studies:  Recent Labs  11/21/15 1819  TSH 0.996   Anemia work up: No results for input(s): VITAMINB12, FOLATE, FERRITIN, TIBC, IRON, RETICCTPCT in the last 72 hours. Sepsis Labs:  Recent Labs Lab 11/21/15 1422 11/22/15 0537  WBC 13.1* 10.4   Urine analysis:    Component Value Date/Time   COLORURINE ORANGE* 11/21/2015 2148   APPEARANCEUR TURBID* 11/21/2015 2148   LABSPEC 1.026 11/21/2015 2148   PHURINE 5.0 11/21/2015 2148   GLUCOSEU 100* 11/21/2015 2148   HGBUR NEGATIVE 11/21/2015 2148   BILIRUBINUR MODERATE* 11/21/2015 2148   KETONESUR 15* 11/21/2015 2148   PROTEINUR 100* 11/21/2015 2148   NITRITE POSITIVE* 11/21/2015 2148   LEUKOCYTESUR SMALL* 11/21/2015 2148   Microbiology No results found for this or any previous visit (from the past 240 hour(s)).  Radiology: Dg Chest 2 View  11/21/2015  CLINICAL DATA:  Multiple syncopal episodes. Patient fell, striking head. Initial encounter. EXAM: CHEST  2 VIEW COMPARISON:  None. FINDINGS: 1513 hours. The heart is mildly enlarged. There is aortic tortuosity. The lungs are mildly hyperinflated but clear. There is some calcification along the right hemidiaphragm. No pleural effusion or pneumothorax is seen. Mild degenerative changes are present throughout the spine. IMPRESSION: No active cardiopulmonary process or acute posttraumatic findings demonstrated. Mild cardiomegaly. Electronically Signed   By: Carey Bullocks M.D.   On: 11/21/2015 15:12   Dg Ribs Unilateral Left  11/22/2015  CLINICAL DATA:  Fall on left side 2 days ago, left lower rib pain  EXAM: LEFT RIBS - 2 VIEW COMPARISON:  11/21/2015 FINDINGS: Two views left ribs submitted. No infiltrate or pulmonary edema. No rib fracture is identified. There is no pneumothorax. IMPRESSION: Negative. Electronically Signed   By: Natasha Mead M.D.   On: 11/22/2015 12:38   Ct Head Wo Contrast  11/21/2015  CLINICAL DATA:  Fall, neck pain EXAM: CT HEAD WITHOUT CONTRAST CT CERVICAL SPINE WITHOUT CONTRAST TECHNIQUE: Multidetector CT imaging of the head and cervical spine was performed following the standard protocol without intravenous contrast. Multiplanar CT image reconstructions of the cervical spine were also generated. COMPARISON:  None. FINDINGS: CT HEAD FINDINGS No skull fracture is noted. Paranasal sinuses and mastoid air cells are unremarkable. Atherosclerotic calcifications of carotid  siphon are noted. No intracranial hemorrhage, mass effect or midline shift. Moderate cerebral atrophy. Mild periventricular and patchy subcortical white matter decreased attenuation probable due to chronic small vessel ischemic changes. No acute cortical infarction. No mass lesion is noted on this unenhanced scan. CT CERVICAL SPINE FINDINGS Axial images of the cervical spine shows no acute fracture or subluxation. Atherosclerotic calcifications of vertebral arteries are noted. Computer processed images shows no acute fracture or subluxation. Mild degenerative changes C1-C2 articulation. Mild posterior disc bulge at C3-C4 level. Mild anterior spurring lower endplate of C4 and C5 vertebral body. Mild disc space flattening with minimal posterior disc bulge as at C6-C7 level. No prevertebral soft tissue swelling. Cervical airway is patent. There is no pneumothorax in visualized lung apices. IMPRESSION: 1. No acute intracranial abnormality. There is moderate cerebral atrophy. Mild periventricular and patchy subcortical white matter decreased attenuation probable due to chronic small vessel ischemic changes. 2. No cervical spine acute  fracture or subluxation. Mild degenerative changes as described above. Electronically Signed   By: Natasha Mead M.D.   On: 11/21/2015 15:09   Ct Cervical Spine Wo Contrast  11/21/2015  CLINICAL DATA:  Fall, neck pain EXAM: CT HEAD WITHOUT CONTRAST CT CERVICAL SPINE WITHOUT CONTRAST TECHNIQUE: Multidetector CT imaging of the head and cervical spine was performed following the standard protocol without intravenous contrast. Multiplanar CT image reconstructions of the cervical spine were also generated. COMPARISON:  None. FINDINGS: CT HEAD FINDINGS No skull fracture is noted. Paranasal sinuses and mastoid air cells are unremarkable. Atherosclerotic calcifications of carotid siphon are noted. No intracranial hemorrhage, mass effect or midline shift. Moderate cerebral atrophy. Mild periventricular and patchy subcortical white matter decreased attenuation probable due to chronic small vessel ischemic changes. No acute cortical infarction. No mass lesion is noted on this unenhanced scan. CT CERVICAL SPINE FINDINGS Axial images of the cervical spine shows no acute fracture or subluxation. Atherosclerotic calcifications of vertebral arteries are noted. Computer processed images shows no acute fracture or subluxation. Mild degenerative changes C1-C2 articulation. Mild posterior disc bulge at C3-C4 level. Mild anterior spurring lower endplate of C4 and C5 vertebral body. Mild disc space flattening with minimal posterior disc bulge as at C6-C7 level. No prevertebral soft tissue swelling. Cervical airway is patent. There is no pneumothorax in visualized lung apices. IMPRESSION: 1. No acute intracranial abnormality. There is moderate cerebral atrophy. Mild periventricular and patchy subcortical white matter decreased attenuation probable due to chronic small vessel ischemic changes. 2. No cervical spine acute fracture or subluxation. Mild degenerative changes as described above. Electronically Signed   By: Natasha Mead M.D.   On:  11/21/2015 15:09   US Renal  11/21/2015  CLINICAL DATA:  Acute kidney injury. EXAM: RENAL / URINARY TRACT ULTRASOUND COMPLETE COMPARISON:  None. FINDINGS: Right Kidney: Length: 11.3 cm. Echogenicity within normal limits. No mass or hydronephrosis visualized. Left Kidney: Length: 11.4 cm. Echogenicity within normal limits. No mass or hydronephrosis visualized. Bladder: Appears normal for degree of bladder distention. IMPRESSION: Normal exam. Electronically Signed   By: Signa Kell M.D.   On: 11/21/2015 19:22    Medications:   . amLODipine  10 mg Oral Daily  . feeding supplement  1 Container Oral TID BM  . folic acid  1 mg Oral Daily  . metoprolol tartrate  25 mg Oral BID  . multivitamin with minerals  1 tablet Oral Daily  . pantoprazole  40 mg Oral BID  . thiamine  100 mg Oral Daily   Or  .  thiamine  100 mg Intravenous Daily   Continuous Infusions: . sodium chloride 75 mL/hr at 11/21/15 1942    Time spent: 30 minutes     Ronneisha Jett  [facility] [phone].  *Please refer to [email or web address], password [email or web address] to get updated schedule on who will round on this patient, as hospitalists switch teams weekly. If 7PM-7AM, please contact night-coverage at [email or web address], password [email or web address] for any overnight needs.  11/22/2015, 5:48 PM

## 2015-11-22 NOTE — Progress Notes (Signed)
  Echocardiogram 2D Echocardiogram has been performed.  Arvil Chaco 11/22/2015, 11:32 AM

## 2015-11-22 NOTE — Consult Note (Signed)
[facility] Consult Note  Referring Provider: No ref. provider found Primary Care Physician:  Pcp Not In System Primary Gastroenterologist:  Dr.  Laurel Dimmer Complaint: Consult for dark emesis and heme positive stool HPI: Derrick Ramos is an 69 y.o. black male  presented with multiple falls and syncope with head laceration. During ER evaluation he reported that 3 and 4 days ago he had some "dark" emesis, none since. He denies any dark stools. He also reported that he was told he has cirrhosis but appears to know little about that other that was diagnosed prior to his moving here from [place] through 4 years ago. He also relates being admitted for GI bleed in [place] in 2016 which is apparently manifest as rectal bleeding. He states he had a colonoscopy and endoscopy but does not know the results. He is a heavy drinker. On presentation here, his hemoglobin, liver function test, platelet count, prothrombin time are all normal. He did have an occult heme positive stool  Past Medical History  Diagnosis Date  . Hypertension   . GERD (gastroesophageal reflux disease)   . Atrial fibrillation ([place])     Archie Endo 11/21/2015  . History of blood transfusion 2016    related to GI bleed/notes 11/21/2015  . Hepatic cirrhosis ([place])     Archie Endo 11/21/2015  . PVD (peripheral vascular disease) ([place])     Archie Endo 11/21/2015  . Syncope and collapse     required staples to back of head/notes 11/21/2015  . Hematemesis     Archie Endo 11/21/2015  . Acute kidney injury ([place])     Archie Endo 11/21/2015  . H/O syncope     recurrent episodes/notes 11/21/2015  . GI bleed 2016    Archie Endo 11/21/2015    Past Surgical History  Procedure Laterality Date  . Colonoscopy    . Esophagogastroduodenoscopy      Medications Prior to Admission  Medication Sig Dispense Refill  . amLODipine (NORVASC) 10 MG tablet Take 1 tablet by mouth daily.    Marland Kitchen loratadine (CLARITIN) 10 MG tablet Take 1 tablet by mouth daily.    . metoprolol tartrate  (LOPRESSOR) 25 MG tablet Take 1 tablet by mouth 2 (two) times daily.    . ranitidine (ZANTAC) 150 MG capsule Take 1 capsule by mouth 2 (two) times daily.      Allergies: No Known Allergies  History reviewed. No pertinent family history.  Social History:  reports that he has been smoking Cigarettes.  He has a 6.6 pack-year smoking history. He has never used smokeless tobacco. He reports that he drinks about 156.6 oz of alcohol per week. He reports that he does not use illicit drugs.  Review of Systems: negative except As above   Blood pressure 130/83, pulse 89, temperature 98.5 F (36.9 C), temperature source Oral, resp. rate 18, height 5' 6"  (1.676 m), weight 57.6 kg (126 lb 15.8 oz), SpO2 100 %. Head: Normocephalic, without obvious abnormality, atraumatic Neck: no adenopathy, no carotid bruit, no JVD, supple, symmetrical, trachea midline and thyroid not enlarged, symmetric, no tenderness/mass/nodules Resp: clear to auscultation bilaterally Cardio: regular rate and rhythm, S1, S2 normal, no murmur, click, rub or gallop GI: Abdomen soft nondistended no organomegaly or shifting dullness. Extremities: extremities normal, atraumatic, no cyanosis or edema  Results for orders placed or performed during the hospital encounter of 11/21/15 (from the past 48 hour(s))  Basic metabolic panel     Status: Abnormal   Collection Time: 11/21/15  2:22 PM  Result Value Ref Range  Sodium 136 135 - 145 mmol/L   Potassium 3.3 (L) 3.5 - 5.1 mmol/L   Chloride 100 (L) 101 - 111 mmol/L   CO2 24 22 - 32 mmol/L   Glucose, Bld 122 (H) 65 - 99 mg/dL   BUN 9 6 - 20 mg/dL   Creatinine, Ser 2.28 (H) 0.61 - 1.24 mg/dL   Calcium 9.2 8.9 - 10.3 mg/dL   GFR calc non Af Amer 27 (L) >60 mL/min   GFR calc Af Amer 32 (L) >60 mL/min    Comment: (NOTE) The eGFR has been calculated using the CKD EPI equation. This calculation has not been validated in all clinical situations. eGFR's persistently <60 mL/min signify  possible Chronic Kidney Disease.    Anion gap 12 5 - 15  CBC     Status: Abnormal   Collection Time: 11/21/15  2:22 PM  Result Value Ref Range   WBC 13.1 (H) 4.0 - 10.5 K/uL   RBC 4.73 4.22 - 5.81 MIL/uL   Hemoglobin 15.8 13.0 - 17.0 g/dL   HCT 46.2 39.0 - 52.0 %   MCV 97.7 78.0 - 100.0 fL   MCH 33.4 26.0 - 34.0 pg   MCHC 34.2 30.0 - 36.0 g/dL   RDW 13.0 11.5 - 15.5 %   Platelets 214 150 - 400 K/uL  I-stat troponin, ED (not at [facility])     Status: None   Collection Time: 11/21/15  4:14 PM  Result Value Ref Range   Troponin i, poc 0.02 0.00 - 0.08 ng/mL   Comment 3            Comment: Due to the release kinetics of cTnI, a negative result within the first hours of the onset of symptoms does not rule out myocardial infarction with certainty. If myocardial infarction is still suspected, repeat the test at appropriate intervals.   T4, free     Status: None   Collection Time: 11/21/15  6:18 PM  Result Value Ref Range   Free T4 0.87 0.61 - 1.12 ng/dL    Comment: (NOTE) Biotin ingestion may interfere with free T4 tests. If the results are inconsistent with the TSH level, previous test results, or the clinical presentation, then consider biotin interference. If needed, order repeat testing after stopping biotin.   Troponin I     Status: Abnormal   Collection Time: 11/21/15  6:18 PM  Result Value Ref Range   Troponin I 0.03 (HH) <0.03 ng/mL    Comment: CRITICAL RESULT CALLED TO, READ BACK BY AND VERIFIED WITH: Cleda Daub RN [phone] [address] R   Hepatic function panel     Status: Abnormal   Collection Time: 11/21/15  6:18 PM  Result Value Ref Range   Total Protein 7.1 6.5 - 8.1 g/dL   Albumin 3.4 (L) 3.5 - 5.0 g/dL   AST 41 15 - 41 U/L   ALT 26 17 - 63 U/L   Alkaline Phosphatase 100 38 - 126 U/L   Total Bilirubin 1.6 (H) 0.3 - 1.2 mg/dL   Bilirubin, Direct 0.3 0.1 - 0.5 mg/dL   Indirect Bilirubin 1.3 (H) 0.3 - 0.9 mg/dL  TSH     Status: None   Collection Time: 11/21/15  6:19  PM  Result Value Ref Range   TSH 0.996 0.350 - 4.500 uIU/mL  Hemoglobin A1c     Status: Abnormal   Collection Time: 11/21/15  6:23 PM  Result Value Ref Range   Hgb A1c MFr Bld 4.7 (L) 4.8 -  5.6 %    Comment: (NOTE)         Pre-diabetes: 5.7 - 6.4         Diabetes: >6.4         Glycemic control for adults with diabetes: <7.0    Mean Plasma Glucose 88 mg/dL    Comment: (NOTE) Performed At: [facility] [place], [place] [id number] Lindon Romp MD OA:[id number]   Protime-INR     Status: None   Collection Time: 11/21/15  6:31 PM  Result Value Ref Range   Prothrombin Time 14.8 11.6 - 15.2 seconds   INR 1.15 0.00 - 1.49  APTT     Status: None   Collection Time: 11/21/15  6:31 PM  Result Value Ref Range   aPTT 30 24 - 37 seconds  Urinalysis, Routine w reflex microscopic     Status: Abnormal   Collection Time: 11/21/15  9:48 PM  Result Value Ref Range   Color, Urine ORANGE (A) YELLOW    Comment: BIOCHEMICALS MAY BE AFFECTED BY COLOR   APPearance TURBID (A) CLEAR   Specific Gravity, Urine 1.026 1.005 - 1.030   pH 5.0 5.0 - 8.0   Glucose, UA 100 (A) NEGATIVE mg/dL   Hgb urine dipstick NEGATIVE NEGATIVE   Bilirubin Urine MODERATE (A) NEGATIVE   Ketones, ur 15 (A) NEGATIVE mg/dL   Protein, ur 100 (A) NEGATIVE mg/dL   Nitrite POSITIVE (A) NEGATIVE   Leukocytes, UA SMALL (A) NEGATIVE  Urine microscopic-add on     Status: Abnormal   Collection Time: 11/21/15  9:48 PM  Result Value Ref Range   Squamous Epithelial / LPF 6-30 (A) NONE SEEN   WBC, UA 0-5 0 - 5 WBC/hpf   RBC / HPF 0-5 0 - 5 RBC/hpf   Bacteria, UA FEW (A) NONE SEEN   Casts HYALINE CASTS (A) NEGATIVE    Comment: GRANULAR CAST  Occult blood card to lab, stool RN will collect     Status: Abnormal   Collection Time: 11/21/15 10:03 PM  Result Value Ref Range   Fecal Occult Bld POSITIVE (A) NEGATIVE  Troponin I     Status: Abnormal   Collection Time: 11/21/15 11:31 PM  Result Value Ref Range    Troponin I 0.03 (HH) <0.03 ng/mL    Comment: CRITICAL VALUE NOTED.  VALUE IS CONSISTENT WITH PREVIOUSLY REPORTED AND CALLED VALUE.  Hemoglobin and hematocrit, blood     Status: None   Collection Time: 11/21/15 11:34 PM  Result Value Ref Range   Hemoglobin 14.2 13.0 - 17.0 g/dL   HCT 42.8 39.0 - 52.0 %  Troponin I     Status: Abnormal   Collection Time: 11/22/15  5:37 AM  Result Value Ref Range   Troponin I 0.04 (HH) <0.03 ng/mL    Comment: CRITICAL VALUE NOTED.  VALUE IS CONSISTENT WITH PREVIOUSLY REPORTED AND CALLED VALUE.  CBC     Status: None   Collection Time: 11/22/15  5:37 AM  Result Value Ref Range   WBC 10.4 4.0 - 10.5 K/uL   RBC 4.46 4.22 - 5.81 MIL/uL   Hemoglobin 14.5 13.0 - 17.0 g/dL   HCT 43.4 39.0 - 52.0 %   MCV 97.3 78.0 - 100.0 fL   MCH 32.5 26.0 - 34.0 pg   MCHC 33.4 30.0 - 36.0 g/dL   RDW 13.2 11.5 - 15.5 %   Platelets 196 150 - 400 K/uL  Comprehensive metabolic panel     Status: Abnormal  Collection Time: 11/22/15  5:37 AM  Result Value Ref Range   Sodium 139 135 - 145 mmol/L   Potassium 4.6 3.5 - 5.1 mmol/L    Comment: DELTA CHECK NOTED NO VISIBLE HEMOLYSIS    Chloride 105 101 - 111 mmol/L   CO2 26 22 - 32 mmol/L   Glucose, Bld 84 65 - 99 mg/dL   BUN 9 6 - 20 mg/dL   Creatinine, Ser 1.21 0.61 - 1.24 mg/dL    Comment: DELTA CHECK NOTED   Calcium 8.9 8.9 - 10.3 mg/dL   Total Protein 6.5 6.5 - 8.1 g/dL   Albumin 3.0 (L) 3.5 - 5.0 g/dL   AST 31 15 - 41 U/L   ALT 23 17 - 63 U/L   Alkaline Phosphatase 95 38 - 126 U/L   Total Bilirubin 1.7 (H) 0.3 - 1.2 mg/dL   GFR calc non Af Amer 58 (L) >60 mL/min   GFR calc Af Amer >60 >60 mL/min    Comment: (NOTE) The eGFR has been calculated using the CKD EPI equation. This calculation has not been validated in all clinical situations. eGFR's persistently <60 mL/min signify possible Chronic Kidney Disease.    Anion gap 8 5 - 15   Dg Chest 2 View  11/21/2015  CLINICAL DATA:  Multiple syncopal episodes. Patient  fell, striking head. Initial encounter. EXAM: CHEST  2 VIEW COMPARISON:  None. FINDINGS: 1513 hours. The heart is mildly enlarged. There is aortic tortuosity. The lungs are mildly hyperinflated but clear. There is some calcification along the right hemidiaphragm. No pleural effusion or pneumothorax is seen. Mild degenerative changes are present throughout the spine. IMPRESSION: No active cardiopulmonary process or acute posttraumatic findings demonstrated. Mild cardiomegaly. Electronically Signed   By: Richardean Sale M.D.   On: 11/21/2015 15:12   Ct Head Wo Contrast  11/21/2015  CLINICAL DATA:  Fall, neck pain EXAM: CT HEAD WITHOUT CONTRAST CT CERVICAL SPINE WITHOUT CONTRAST TECHNIQUE: Multidetector CT imaging of the head and cervical spine was performed following the standard protocol without intravenous contrast. Multiplanar CT image reconstructions of the cervical spine were also generated. COMPARISON:  None. FINDINGS: CT HEAD FINDINGS No skull fracture is noted. Paranasal sinuses and mastoid air cells are unremarkable. Atherosclerotic calcifications of carotid siphon are noted. No intracranial hemorrhage, mass effect or midline shift. Moderate cerebral atrophy. Mild periventricular and patchy subcortical white matter decreased attenuation probable due to chronic small vessel ischemic changes. No acute cortical infarction. No mass lesion is noted on this unenhanced scan. CT CERVICAL SPINE FINDINGS Axial images of the cervical spine shows no acute fracture or subluxation. Atherosclerotic calcifications of vertebral arteries are noted. Computer processed images shows no acute fracture or subluxation. Mild degenerative changes C1-C2 articulation. Mild posterior disc bulge at C3-C4 level. Mild anterior spurring lower endplate of C4 and C5 vertebral body. Mild disc space flattening with minimal posterior disc bulge as at C6-C7 level. No prevertebral soft tissue swelling. Cervical airway is patent. There is no  pneumothorax in visualized lung apices. IMPRESSION: 1. No acute intracranial abnormality. There is moderate cerebral atrophy. Mild periventricular and patchy subcortical white matter decreased attenuation probable due to chronic small vessel ischemic changes. 2. No cervical spine acute fracture or subluxation. Mild degenerative changes as described above. Electronically Signed   By: Lahoma Crocker M.D.   On: 11/21/2015 15:09   Ct Cervical Spine Wo Contrast  11/21/2015  CLINICAL DATA:  Fall, neck pain EXAM: CT HEAD WITHOUT CONTRAST CT CERVICAL SPINE WITHOUT CONTRAST  TECHNIQUE: Multidetector CT imaging of the head and cervical spine was performed following the standard protocol without intravenous contrast. Multiplanar CT image reconstructions of the cervical spine were also generated. COMPARISON:  None. FINDINGS: CT HEAD FINDINGS No skull fracture is noted. Paranasal sinuses and mastoid air cells are unremarkable. Atherosclerotic calcifications of carotid siphon are noted. No intracranial hemorrhage, mass effect or midline shift. Moderate cerebral atrophy. Mild periventricular and patchy subcortical white matter decreased attenuation probable due to chronic small vessel ischemic changes. No acute cortical infarction. No mass lesion is noted on this unenhanced scan. CT CERVICAL SPINE FINDINGS Axial images of the cervical spine shows no acute fracture or subluxation. Atherosclerotic calcifications of vertebral arteries are noted. Computer processed images shows no acute fracture or subluxation. Mild degenerative changes C1-C2 articulation. Mild posterior disc bulge at C3-C4 level. Mild anterior spurring lower endplate of C4 and C5 vertebral body. Mild disc space flattening with minimal posterior disc bulge as at C6-C7 level. No prevertebral soft tissue swelling. Cervical airway is patent. There is no pneumothorax in visualized lung apices. IMPRESSION: 1. No acute intracranial abnormality. There is moderate cerebral  atrophy. Mild periventricular and patchy subcortical white matter decreased attenuation probable due to chronic small vessel ischemic changes. 2. No cervical spine acute fracture or subluxation. Mild degenerative changes as described above. Electronically Signed   By: Lahoma Crocker M.D.   On: 11/21/2015 15:09   US Renal  11/21/2015  CLINICAL DATA:  Acute kidney injury. EXAM: RENAL / URINARY TRACT ULTRASOUND COMPLETE COMPARISON:  None. FINDINGS: Right Kidney: Length: 11.3 cm. Echogenicity within normal limits. No mass or hydronephrosis visualized. Left Kidney: Length: 11.4 cm. Echogenicity within normal limits. No mass or hydronephrosis visualized. Bladder: Appears normal for degree of bladder distention. IMPRESSION: Normal exam. Electronically Signed   By: Kerby Moors M.D.   On: 11/21/2015 19:22    Assessment: Heme positive stool with self-report of "dark" emesis, now resolved. Very questionable self-report of cirrhosis and apparent admission for GI bleeding with EGD and colonoscopy and [place] a year ago. Plan:  Empiric PPI therapy Try to obtain records from [facility] regarding his previous admission. No urgent need for endoscopic reevaluation pending receipt of the above. Will follow with you. Miraya Cudney C 11/22/2015, 12:06 PM  Pager [phone] If no answer or after 5 PM call [phone]

## 2015-11-22 NOTE — Progress Notes (Signed)
PT Cancellation Note  Patient Details Name: Derrick Ramos MRN: [id number] DOB: 1946-02-12   Cancelled Treatment:    Reason Eval/Treat Not Completed: Other (comment) (Troponins trending upward. Will check back tomorrow.  )   Tawni Millers F 11/22/2015, 3:55 PM  [facility] [phone] (pager)

## 2015-11-22 NOTE — Progress Notes (Signed)
11/21/2015 1800 Received pt to room 2W35 from ED.  Pt is A&O.  Without C/O at this time.  Tele monitor applied and CCMD notified.  Oriented to room, call light and bed.  Call bell in reach, family at bedside. Derrick Ramos

## 2015-11-22 NOTE — Progress Notes (Signed)
Initial Nutrition Assessment  DOCUMENTATION CODES:   Non-severe (moderate) malnutrition in context of chronic illness  INTERVENTION:   Continue Boost Breeze po TID, each supplement provides 250 kcal and 9 grams of protein  Continue MVI with minerals daily  NUTRITION DIAGNOSIS:   Increased nutrient needs related to chronic illness as evidenced by estimated needs  GOAL:   Patient will meet greater than or equal to 90% of their needs  MONITOR:   PO intake, Supplement acceptance, Labs, Weight trends, I & O's  REASON FOR ASSESSMENT:   Malnutrition Screening Tool  ASSESSMENT:   70 y.o. Male with a history of cirrhosis (self-reported), daily EtOH use, active tobacco use, PVD, and HTN who presents to the ED accompanied by his wife and daughter who has had recurrent syncopal episodes. The episode today resulted in head trauma and laceration requiring staples in the ED. EKG also shows atrial fibrillation (rate controlled), which is new for him.  Patient not very conversive with RD interview. Reports he's eating OK >> PO intake 100% on clear liquid diet. Noted pt with hx of ETOH abuse >> quality of nutritional intake PTA likely poor. Would benefit from oral nutrition supplements >> Boost Breeze orders in place. Nutrition-Focused physical exam completed. Findings are moderate fat depletion, moderate muscle depletion, and no edema.   Diet Order:  Diet clear liquid Room service appropriate?: Yes; Fluid consistency:: Thin  Skin:  Reviewed, no issues  Last BM:  7/4  Height:   Ht Readings from Last 1 Encounters:  11/21/15 5\' 6"  (1.676 m)    Weight:   Wt Readings from Last 1 Encounters:  11/22/15 126 lb 15.8 oz (57.6 kg)    Ideal Body Weight:  59 kg  BMI:  Body mass index is 20.51 kg/(m^2).  Estimated Nutritional Needs:   Kcal:  1700-1900  Protein:  80-90 gm  Fluid:  1.7-1.9 L  EDUCATION NEEDS:   No education needs identified at this time  Maureen Chatters, RD,  LDN Pager #: [phone] After-Hours Pager #: [phone]

## 2015-11-23 ENCOUNTER — Observation Stay (HOSPITAL_BASED_OUTPATIENT_CLINIC_OR_DEPARTMENT_OTHER): Payer: Medicare Other

## 2015-11-23 DIAGNOSIS — N179 Acute kidney failure, unspecified: Secondary | ICD-10-CM

## 2015-11-23 DIAGNOSIS — I519 Heart disease, unspecified: Secondary | ICD-10-CM

## 2015-11-23 DIAGNOSIS — I480 Paroxysmal atrial fibrillation: Secondary | ICD-10-CM

## 2015-11-23 DIAGNOSIS — I10 Essential (primary) hypertension: Secondary | ICD-10-CM

## 2015-11-23 DIAGNOSIS — I25110 Atherosclerotic heart disease of native coronary artery with unstable angina pectoris: Secondary | ICD-10-CM | POA: Diagnosis not present

## 2015-11-23 DIAGNOSIS — I4891 Unspecified atrial fibrillation: Secondary | ICD-10-CM

## 2015-11-23 DIAGNOSIS — R55 Syncope and collapse: Secondary | ICD-10-CM

## 2015-11-23 LAB — VAS US CAROTID
LEFT ECA DIAS: -2 cm/s
LEFT VERTEBRAL DIAS: -11 cm/s
LICADSYS: -49 cm/s
Left CCA dist dias: -6 cm/s
Left CCA dist sys: -58 cm/s
Left CCA prox dias: 5 cm/s
Left CCA prox sys: 85 cm/s
Left ICA dist dias: -15 cm/s
Left ICA prox dias: -7 cm/s
Left ICA prox sys: -30 cm/s
RCCADSYS: -52 cm/s
RCCAPSYS: 71 cm/s
RIGHT ECA DIAS: -5 cm/s
RIGHT VERTEBRAL DIAS: -3 cm/s
Right CCA prox dias: 12 cm/s

## 2015-11-23 LAB — PROTIME-INR
INR: 1 (ref 0.00–1.49)
Prothrombin Time: 13.4 seconds (ref 11.6–15.2)

## 2015-11-23 LAB — BASIC METABOLIC PANEL
ANION GAP: 6 (ref 5–15)
BUN: <5 mg/dL — ABNORMAL LOW (ref 6–20)
CO2: 25 mmol/L (ref 22–32)
Calcium: 8.7 mg/dL — ABNORMAL LOW (ref 8.9–10.3)
Chloride: 106 mmol/L (ref 101–111)
Creatinine, Ser: 0.95 mg/dL (ref 0.61–1.24)
GFR calc Af Amer: >60 mL/min (ref >60)
Glucose, Bld: 109 mg/dL — ABNORMAL HIGH (ref 65–99)
POTASSIUM: 4.1 mmol/L (ref 3.5–5.1)
SODIUM: 137 mmol/L (ref 135–145)

## 2015-11-23 LAB — HEMOGLOBIN AND HEMATOCRIT, BLOOD
HCT: 41 % (ref 39.0–52.0)
HEMATOCRIT: 43.7 % (ref 39.0–52.0)
Hemoglobin: 14.3 g/dL (ref 13.0–17.0)
Hemoglobin: 13.9 g/dL (ref 13.0–17.0)

## 2015-11-23 MED ORDER — SODIUM CHLORIDE 0.9 % IV SOLN: 250.0000 mL | INTRAVENOUS | Status: DC | PRN

## 2015-11-23 MED ORDER — ASPIRIN 81 MG PO CHEW
81.0000 mg | CHEWABLE_TABLET | ORAL | Status: AC
  Administered 2015-11-24: 81 mg via ORAL
  Filled 2015-11-23: qty 1

## 2015-11-23 MED ORDER — SODIUM CHLORIDE 0.9% FLUSH
3.0000 mL | Freq: Two times a day (BID) | INTRAVENOUS | Status: DC
  Administered 2015-11-23: 3 mL via INTRAVENOUS

## 2015-11-23 MED ORDER — SODIUM CHLORIDE 0.9% FLUSH: 3.0000 mL | INTRAVENOUS | Status: DC | PRN

## 2015-11-23 MED ORDER — SODIUM CHLORIDE 0.9 % WEIGHT BASED INFUSION: 1.0000 mL/kg/h | INTRAVENOUS | Status: DC

## 2015-11-23 MED ORDER — SODIUM CHLORIDE 0.9 % WEIGHT BASED INFUSION
3.0000 mL/kg/h | INTRAVENOUS | Status: DC
  Administered 2015-11-24: 3 mL/kg/h via INTRAVENOUS

## 2015-11-23 NOTE — Progress Notes (Signed)
Progress Note    Derrick Ramos  ZOX:[id number] DOB: February 12, 1946  DOA: 11/21/2015 PCP: Pcp Not In System    Brief Narrative:   Derrick Ramos is an 70 y.o. male gentleman with a history of cirrhosis (self-reported), daily EtOH use, active tobacco use, PVD, and HTN who presents to the ED accompanied by his wife and daughter who has had recurrent syncopal episodes.   Assessment/Plan:   Principal Problem:   Syncope and collapse Active Problems:   Atrial fibrillation (HCC)   Hypokalemia   HTN (hypertension)   Hepatic cirrhosis (HCC)   Hematemesis   AKI (acute kidney injury) (HCC)   Laceration of head   Tobacco use disorder   Alcohol abuse   Syncope   Malnutrition of moderate degree  Syncope possibly from ETOH abuse/ arrhythmias/ orthostatic: ? Coffee ground emesis.  Hemoglobin stable.  GI consulted for further eval. Recommended no further work up at this time as he had EGD and colon[place] at [place] one year ago. Will try to get records . Advance diet as tolerated and currently on PPI oral bid.  Hemoglobin stable around 14.     New onset / Paroxysmal atrial fibrillation: Echo shows Systolic function was mildly to moderately reduced. The estimated ejection fraction was  in the range of 40% to 45%. There is severe hypokinesis of the entire inferolateral myocardium. There is akinesis of the  basalinferior myocardium. The study was not technically sufficient to allow evaluation of LV diastolic dysfunction due to  atrial fibrillation. [place] score is 3.  But because of his ETOH abuse, not a good candidate for anticoagulation.  Cardiology consulted to see if he needs cath?    Aki: Improved with gentle hydration. Stopped IV fluids today.  Recheck renal parameters today.    Hypertension: well controlled.     Liver Cirrhosis: Liver function panel wnl.    etoh abuse: On CIWA protocol.   Left rib pain: Rib series ordered and negative for fracture.  Pain control .   Avoid NSAIDS.   Abnormal UA:  Urine cultures not sent on admission. He is asymptomatic. Denies any dysuria.  Continue to monitor.   Fall and scalp wound: No bleeding.     Family Communication/Anticipated D/C date and plan/Code Status   DVT prophylaxis: Lovenox ordered. Code Status: Full Code.  Family Communication: none at bedside.  Disposition Plan: pending further investigation.   Medical Consultants:    Gastroenterology.   cardiology   Procedures:  echocardiogram  Anti-Infectives:   Anti-infectives    None      Subjective:    Derrick Ramos reports pain has improved, denies any chest pain or sob. Denies any dysuria.   Objective:    Filed Vitals:   11/23/15 0153 11/23/15 0514 11/23/15 0938 11/23/15 1151  BP:  143/96 157/96 138/84  Pulse:  76 84 75  Temp:  99 F (37.2 C)    TempSrc:  Oral    Resp:  16  18  Height:      Weight: 60.011 kg (132 lb 4.8 oz) 58.741 kg (129 lb 8 oz)    SpO2:  100%  99%    Intake/Output Summary (Last 24 hours) at 11/23/15 1351 Last data filed at 11/23/15 1145  Gross per 24 hour  Intake    600 ml  Output   1150 ml  Net   -550 ml   Filed Weights   11/22/15 0621 11/23/15 0153 11/23/15 0514  Weight: 57.6 kg (126 lb 15.8 oz)  60.011 kg (132 lb 4.8 oz) 58.741 kg (129 lb 8 oz)    Exam: General exam: Appears calm and comfortable.  Respiratory system: Clear to auscultation. Respiratory effort normal. Cardiovascular system: S1 & S2 heard, RRR. No JVD,  rubs, gallops or clicks. No murmurs. Gastrointestinal system:tender abdomen, soft, non distended. Pain in the left rib area Central nervous system: Alert and oriented. No focal neurological deficits. Extremities: No clubbing, edema, or cyanosis. Skin: No rashes, lesions or ulcers .   Data Reviewed:   I have personally reviewed following labs and imaging studies:  Labs: Basic Metabolic Panel:  Recent Labs Lab 11/21/15 1422 11/22/15 0537  NA 136 139  K 3.3* 4.6    CL 100* 105  CO2 24 26  GLUCOSE 122* 84  BUN 9 9  CREATININE 2.28* 1.21  CALCIUM 9.2 8.9   GFR Estimated Creatinine Clearance: 46.5 mL/min (by C-G formula based on Cr of 1.21). Liver Function Tests:  Recent Labs Lab 11/21/15 1818 11/22/15 0537  AST 41 31  ALT 26 23  ALKPHOS 100 95  BILITOT 1.6* 1.7*  PROT 7.1 6.5  ALBUMIN 3.4* 3.0*   No results for input(s): LIPASE, AMYLASE in the last 168 hours. No results for input(s): AMMONIA in the last 168 hours. Coagulation profile  Recent Labs Lab 11/21/15 1831 11/23/15 1029  INR 1.15 1.00    CBC:  Recent Labs Lab 11/21/15 1422 11/21/15 2334 11/22/15 0537 11/22/15 1919 11/23/15 0647  WBC 13.1*  --  10.4  --   --   HGB 15.8 14.2 14.5 14.2 14.3  HCT 46.2 42.8 43.4 42.5 43.7  MCV 97.7  --  97.3  --   --   PLT 214  --  196  --   --    Cardiac Enzymes:  Recent Labs Lab 11/21/15 1818 11/21/15 2331 11/22/15 0537  TROPONINI 0.03* 0.03* 0.04*   BNP (last 3 results) No results for input(s): PROBNP in the last 8760 hours. CBG: No results for input(s): GLUCAP in the last 168 hours. D-Dimer: No results for input(s): DDIMER in the last 72 hours. Hgb A1c:  Recent Labs  11/21/15 1823  HGBA1C 4.7*   Lipid Profile: No results for input(s): CHOL, HDL, LDLCALC, TRIG, CHOLHDL, LDLDIRECT in the last 72 hours. Thyroid function studies:  Recent Labs  11/21/15 1819  TSH 0.996   Anemia work up: No results for input(s): VITAMINB12, FOLATE, FERRITIN, TIBC, IRON, RETICCTPCT in the last 72 hours. Sepsis Labs:  Recent Labs Lab 11/21/15 1422 11/22/15 0537  WBC 13.1* 10.4   Urine analysis:    Component Value Date/Time   COLORURINE ORANGE* 11/21/2015 2148   APPEARANCEUR TURBID* 11/21/2015 2148   LABSPEC 1.026 11/21/2015 2148   PHURINE 5.0 11/21/2015 2148   GLUCOSEU 100* 11/21/2015 2148   HGBUR NEGATIVE 11/21/2015 2148   BILIRUBINUR MODERATE* 11/21/2015 2148   KETONESUR 15* 11/21/2015 2148   PROTEINUR 100*  11/21/2015 2148   NITRITE POSITIVE* 11/21/2015 2148   LEUKOCYTESUR SMALL* 11/21/2015 2148   Microbiology No results found for this or any previous visit (from the past 240 hour(s)).  Radiology: Dg Chest 2 View  11/21/2015  CLINICAL DATA:  Multiple syncopal episodes. Patient fell, striking head. Initial encounter. EXAM: CHEST  2 VIEW COMPARISON:  None. FINDINGS: 1513 hours. The heart is mildly enlarged. There is aortic tortuosity. The lungs are mildly hyperinflated but clear. There is some calcification along the right hemidiaphragm. No pleural effusion or pneumothorax is seen. Mild degenerative changes are present throughout the  spine. IMPRESSION: No active cardiopulmonary process or acute posttraumatic findings demonstrated. Mild cardiomegaly. Electronically Signed   By: Carey Bullocks M.D.   On: 11/21/2015 15:12   Dg Ribs Unilateral Left  11/22/2015  CLINICAL DATA:  Fall on left side 2 days ago, left lower rib pain EXAM: LEFT RIBS - 2 VIEW COMPARISON:  11/21/2015 FINDINGS: Two views left ribs submitted. No infiltrate or pulmonary edema. No rib fracture is identified. There is no pneumothorax. IMPRESSION: Negative. Electronically Signed   By: Natasha Mead M.D.   On: 11/22/2015 12:38   Ct Head Wo Contrast  11/21/2015  CLINICAL DATA:  Fall, neck pain EXAM: CT HEAD WITHOUT CONTRAST CT CERVICAL SPINE WITHOUT CONTRAST TECHNIQUE: Multidetector CT imaging of the head and cervical spine was performed following the standard protocol without intravenous contrast. Multiplanar CT image reconstructions of the cervical spine were also generated. COMPARISON:  None. FINDINGS: CT HEAD FINDINGS No skull fracture is noted. Paranasal sinuses and mastoid air cells are unremarkable. Atherosclerotic calcifications of carotid siphon are noted. No intracranial hemorrhage, mass effect or midline shift. Moderate cerebral atrophy. Mild periventricular and patchy subcortical white matter decreased attenuation probable due to chronic  small vessel ischemic changes. No acute cortical infarction. No mass lesion is noted on this unenhanced scan. CT CERVICAL SPINE FINDINGS Axial images of the cervical spine shows no acute fracture or subluxation. Atherosclerotic calcifications of vertebral arteries are noted. Computer processed images shows no acute fracture or subluxation. Mild degenerative changes C1-C2 articulation. Mild posterior disc bulge at C3-C4 level. Mild anterior spurring lower endplate of C4 and C5 vertebral body. Mild disc space flattening with minimal posterior disc bulge as at C6-C7 level. No prevertebral soft tissue swelling. Cervical airway is patent. There is no pneumothorax in visualized lung apices. IMPRESSION: 1. No acute intracranial abnormality. There is moderate cerebral atrophy. Mild periventricular and patchy subcortical white matter decreased attenuation probable due to chronic small vessel ischemic changes. 2. No cervical spine acute fracture or subluxation. Mild degenerative changes as described above. Electronically Signed   By: Natasha Mead M.D.   On: 11/21/2015 15:09   Ct Cervical Spine Wo Contrast  11/21/2015  CLINICAL DATA:  Fall, neck pain EXAM: CT HEAD WITHOUT CONTRAST CT CERVICAL SPINE WITHOUT CONTRAST TECHNIQUE: Multidetector CT imaging of the head and cervical spine was performed following the standard protocol without intravenous contrast. Multiplanar CT image reconstructions of the cervical spine were also generated. COMPARISON:  None. FINDINGS: CT HEAD FINDINGS No skull fracture is noted. Paranasal sinuses and mastoid air cells are unremarkable. Atherosclerotic calcifications of carotid siphon are noted. No intracranial hemorrhage, mass effect or midline shift. Moderate cerebral atrophy. Mild periventricular and patchy subcortical white matter decreased attenuation probable due to chronic small vessel ischemic changes. No acute cortical infarction. No mass lesion is noted on this unenhanced scan. CT CERVICAL  SPINE FINDINGS Axial images of the cervical spine shows no acute fracture or subluxation. Atherosclerotic calcifications of vertebral arteries are noted. Computer processed images shows no acute fracture or subluxation. Mild degenerative changes C1-C2 articulation. Mild posterior disc bulge at C3-C4 level. Mild anterior spurring lower endplate of C4 and C5 vertebral body. Mild disc space flattening with minimal posterior disc bulge as at C6-C7 level. No prevertebral soft tissue swelling. Cervical airway is patent. There is no pneumothorax in visualized lung apices. IMPRESSION: 1. No acute intracranial abnormality. There is moderate cerebral atrophy. Mild periventricular and patchy subcortical white matter decreased attenuation probable due to chronic small vessel ischemic changes. 2. No  cervical spine acute fracture or subluxation. Mild degenerative changes as described above. Electronically Signed   By: Natasha Mead M.D.   On: 11/21/2015 15:09   US Renal  11/21/2015  CLINICAL DATA:  Acute kidney injury. EXAM: RENAL / URINARY TRACT ULTRASOUND COMPLETE COMPARISON:  None. FINDINGS: Right Kidney: Length: 11.3 cm. Echogenicity within normal limits. No mass or hydronephrosis visualized. Left Kidney: Length: 11.4 cm. Echogenicity within normal limits. No mass or hydronephrosis visualized. Bladder: Appears normal for degree of bladder distention. IMPRESSION: Normal exam. Electronically Signed   By: Signa Kell M.D.   On: 11/21/2015 19:22    Medications:   . amLODipine  10 mg Oral Daily  . feeding supplement  1 Container Oral TID BM  . folic acid  1 mg Oral Daily  . metoprolol tartrate  25 mg Oral BID  . multivitamin with minerals  1 tablet Oral Daily  . pantoprazole  40 mg Oral BID  . thiamine  100 mg Oral Daily   Or  . thiamine  100 mg Intravenous Daily   Continuous Infusions: . sodium chloride 75 mL/hr at 11/21/15 1942    Time spent: 30 minutes     Phat Dalton  [facility] [phone].  *Please refer to [email or web address], password [email or web address] to get updated schedule on who will round on this patient, as hospitalists switch teams weekly. If 7PM-7AM, please contact night-coverage at [email or web address], password [email or web address] for any overnight needs.  11/23/2015, 1:51 PM

## 2015-11-23 NOTE — Progress Notes (Signed)
Patient watch cath video. Family arrived later and did not want to see it. Wife has had one in the past and daughter stated she knew about it. Patient wanted daughter to sign permit and she did so. He did agree to procedure in a.m.

## 2015-11-23 NOTE — Evaluation (Addendum)
Physical Therapy Evaluation Patient Details Name: Derrick Ramos MRN: [id number] DOB: 1945-09-29 Today's Date: 11/23/2015   History of Present Illness  70 y.o. gentleman with a history of cirrhosis (self-reported), daily EtOH use, active tobacco use, PVD, and HTN who presented to the ED after multiple episodes of syncope. Syncopal episode on 11-21-15 resulted in head trauma and laceration requiring staple in the ED.  Clinical Impression  Pt admitted with above diagnosis. Pt currently with functional limitations due to the deficits listed below (see PT Problem List). On eval, pt required supervision for bed mobility, min guard assist transfers, and min guard assist ambulation 350 feet without A.D.  Pt will benefit from skilled PT to increase their independence and safety with mobility to allow discharge to the venue listed below.  PT to follow acutely. No f/u services indicated.     Follow Up Recommendations No PT follow up;Supervision for mobility/OOB    Equipment Recommendations  None recommended by PT    Recommendations for Other Services       Precautions / Restrictions Precautions Precautions: Fall Restrictions Weight Bearing Restrictions: No      Mobility  Bed Mobility Overal bed mobility: Needs Assistance Bed Mobility: Supine to Sit     Supine to sit: Supervision;HOB elevated     General bed mobility comments: No physical assist. Supervision for safety only.  Transfers Overall transfer level: Needs assistance Equipment used: None Transfers: Sit to/from [employer] Sit to Stand: Min guard Stand pivot transfers: Min guard       General transfer comment: min guard assist for safety  Ambulation/Gait Ambulation/Gait assistance: Min guard Ambulation Distance (Feet): 350 Feet Assistive device: None Gait Pattern/deviations: Step-through pattern Gait velocity: decreased   General Gait Details: mildly unsteady gait. Pt pushed IV pole for increased  stability.  Stairs            Wheelchair Mobility    Modified Rankin (Stroke Patients Only)       Balance Overall balance assessment: Needs assistance Sitting-balance support: No upper extremity supported;Feet supported Sitting balance-Leahy Scale: Good     Standing balance support: Single extremity supported;During functional activity Standing balance-Leahy Scale: Fair                               Pertinent Vitals/Pain Pain Assessment: Faces Faces Pain Scale: Hurts a little bit Pain Location: L flank Pain Descriptors / Indicators: Sore Pain Intervention(s): Monitored during session;Repositioned    Home Living Family/patient expects to be discharged to:: Private residence Living Arrangements: Spouse/significant other Available Help at Discharge: Family;Available 24 hours/day Type of Home: Apartment Home Access: Level entry     Home Layout: One level Home Equipment: Cane - single point      Prior Function Level of Independence: Independent               Hand Dominance        Extremity/Trunk Assessment   Upper Extremity Assessment: Overall WFL for tasks assessed           Lower Extremity Assessment: Overall WFL for tasks assessed      Cervical / Trunk Assessment: Kyphotic  Communication   Communication: No difficulties  Cognition Arousal/Alertness: Awake/alert Behavior During Therapy: Flat affect Overall Cognitive Status: Within Functional Limits for tasks assessed                      General Comments  Exercises        Assessment/Plan    PT Assessment Patient needs continued PT services  PT Diagnosis Difficulty walking   PT Problem List Decreased activity tolerance;Decreased balance;Decreased mobility;Pain;Decreased knowledge of precautions;Decreased safety awareness  PT Treatment Interventions Gait training;Functional mobility training;Therapeutic activities;Therapeutic exercise;Patient/family  education;Balance training   PT Goals (Current goals can be found in the Care Plan section) Acute Rehab PT Goals Patient Stated Goal: home PT Goal Formulation: With patient Time For Goal Achievement: 12/07/15 Potential to Achieve Goals: Good    Frequency Min 3X/week   Barriers to discharge        Co-evaluation               End of Session Equipment Utilized During Treatment: Gait belt Activity Tolerance: Patient tolerated treatment well Patient left: in chair;with call bell/phone within reach;with chair alarm set Nurse Communication: Mobility status    Functional Assessment Tool Used: clinical judgement Functional Limitation: Mobility: Walking and moving around Mobility: Walking and Moving Around Current Status [phone]): At least 1 percent but less than 20 percent impaired, limited or restricted Mobility: Walking and Moving Around Goal Status [phone]): At least 1 percent but less than 20 percent impaired, limited or restricted    Time: 8295-6213 PT Time Calculation (min) (ACUTE ONLY): 23 min   Charges:   PT Evaluation $PT Eval Moderate Complexity: 1 Procedure PT Treatments $Gait Training: 8-22 mins   PT G Codes:   PT G-Codes **NOT FOR INPATIENT CLASS** Functional Assessment Tool Used: clinical judgement Functional Limitation: Mobility: Walking and moving around Mobility: Walking and Moving Around Current Status ([id number]): At least 1 percent but less than 20 percent impaired, limited or restricted Mobility: Walking and Moving Around Goal Status [phone]): At least 1 percent but less than 20 percent impaired, limited or restricted    Ilda Foil 11/23/2015, 11:31 AM

## 2015-11-23 NOTE — Consult Note (Signed)
CARDIOLOGY CONSULT NOTE   Patient ID: Derrick Ramos MRN: [id number] DOB/AGE: 10/23/1945 70 y.o.  Admit date: 11/21/2015  Primary Physician   Pcp Not In System Primary Cardiologist  New Reason for Consultation   Syncope Requesting Physician  Dr. Blake Divine  HPI: Derrick Ramos is a 70 y.o. male with a history of HTN, self reported cirrhosis, daily alcohol use, tobacco abue and PAD who brought by EMS 11/21/15 after syncope episode x 2.   Per PCP note in Care Everywhere 10/12/15. Pt has PAD and recently evaluated for surgical procedure. Diagnosed with severe LE arterial atherosclerosis with intermittent claudication, might have lost his leg if not so active. Seems seen by [name]., MD.  The patient is poor historian. No family at bedside.   First syncope episode occurred 7/4 when he was walking to his front door. ? Dizziness. When he woke up by EMS he declined to come in for further evaluation however when he tried to stand up he again passed out. Witness episode. Unknown duration. He had head trauma requiring staple in ED. CT of head and cervical spine did not show acute abnormalities that would be attributed to his head trauma. Seen by GI for "dark emesis" and positive stool guaiac. Pending further evaluation. Renal US normal.  EKG on presentation showed afib at rate of 80 bpm, TWI in lateral leads. No prior EKG to compare. Patient denies hx of arrhthymias. Telemetry showed afib at rate of 70s. Troponin 0.03-->0.03-->0.03. TSH normal. Echo showed LV EF of 40-45%, severe s hypokinesis of the entireinferolateral myocardium. There is akinesis of the basalinferior myocardium, mild AR and mildly dilated LA. SCr resolved with hydration.   The patient denies nausea, vomiting, fever, chest pain, palpitations, shortness of breath, orthopnea, PND, cough, congestion, abdominal pain, hematochezia, melena, lower extremity edema. He is heavy alcohol drinker and tobacco smoker.   Past Medical History    Diagnosis Date  . Hypertension   . GERD (gastroesophageal reflux disease)   . Atrial fibrillation (HCC)     [name] 11/21/2015  . History of blood transfusion 2016    related to GI bleed/notes 11/21/2015  . Hepatic cirrhosis (HCC)     [name] 11/21/2015  . PVD (peripheral vascular disease) (HCC)     [name] 11/21/2015  . Syncope and collapse     required staples to back of head/notes 11/21/2015  . Hematemesis     [name] 11/21/2015  . Acute kidney injury (HCC)     [name] 11/21/2015  . H/O syncope     recurrent episodes/notes 11/21/2015  . GI bleed 2016    [name] 11/21/2015     Past Surgical History  Procedure Laterality Date  . Colonoscopy    . Esophagogastroduodenoscopy      No Known Allergies  I have reviewed the patient's current medications . amLODipine  10 mg Oral Daily  . feeding supplement  1 Container Oral TID BM  . folic acid  1 mg Oral Daily  . metoprolol tartrate  25 mg Oral BID  . multivitamin with minerals  1 tablet Oral Daily  . pantoprazole  40 mg Oral BID  . thiamine  100 mg Oral Daily   Or  . thiamine  100 mg Intravenous Daily   . sodium chloride 75 mL/hr at 11/21/15 1942   acetaminophen, LORazepam **OR** LORazepam, ondansetron (ZOFRAN) IV  Prior to Admission medications   Medication Sig Start Date End Date Taking? Authorizing Provider  amLODipine (NORVASC) 10 MG tablet Take 1 tablet by  mouth daily. 10/12/15  Yes Historical Provider, MD  loratadine (CLARITIN) 10 MG tablet Take 1 tablet by mouth daily. 10/12/15 01/10/16 Yes Historical Provider, MD  metoprolol tartrate (LOPRESSOR) 25 MG tablet Take 1 tablet by mouth 2 (two) times daily. 10/12/15  Yes Historical Provider, MD  ranitidine (ZANTAC) 150 MG capsule Take 1 capsule by mouth 2 (two) times daily.   Yes Historical Provider, MD     Social History   Social History  . Marital Status: Married    Spouse Name: N/A  . Number of Children: N/A  . Years of Education: N/A   Occupational History  . Not on file.    Social History Main Topics  . Smoking status: Current Every Day Smoker -- 0.12 packs/day for 55 years    Types: Cigarettes  . Smokeless tobacco: Never Used  . Alcohol Use: 156.6 oz/week    93 Glasses of wine, 168 Cans of beer per week     Comment: 11/21/2015 daily, 2 - 6 pack tall cans daily beer and gallon wine 2-3 days  . Drug Use: No  . Sexual Activity: No   Other Topics Concern  . Not on file   Social History Narrative  . No narrative on file    Denies family history of cardiac disease.   ROS:  Full 14 point review of systems complete and found to be negative unless listed above.  Physical Exam: Blood pressure 157/96, pulse 84, temperature 99 F (37.2 C), temperature source Oral, resp. rate 16, height 5\' 6"  (1.676 m), weight 129 lb 8 oz (58.741 kg), SpO2 100 %.  General: Well developed, well nourished, male in no acute distress Head: Eyes PERRLA, No xanthomas. Normocephalic and atraumatic, oropharynx without edema or exudate.  Lungs: Resp regular and unlabored, CTA. Heart: Ir Ir no s3, s4, or murmurs..   Neck: ? carotid bruits. No lymphadenopathy.  ? JVD. Abdomen: Bowel sounds present, abdomen soft and non-tender without masses or hernias noted. Msk:  No spine or cva tenderness. No weakness, no joint deformities or effusions. Extremities: No clubbing, cyanosis or edema. DP/PT/Radials 2+ and equal bilaterally. Neuro: Alert and oriented X 3. No focal deficits noted. Psych:  Good affect, responds appropriately Skin: No rashes or lesions noted.  Labs:   Lab Results  Component Value Date   WBC 10.4 11/22/2015   HGB 14.3 11/23/2015   HCT 43.7 11/23/2015   MCV 97.3 11/22/2015   PLT 196 11/22/2015    Recent Labs  11/21/15 1831  INR 1.15    Recent Labs Lab 11/22/15 0537  NA 139  K 4.6  CL 105  CO2 26  BUN 9  CREATININE 1.21  CALCIUM 8.9  PROT 6.5  BILITOT 1.7*  ALKPHOS 95  ALT 23  AST 31  GLUCOSE 84  ALBUMIN 3.0*   No results found for: MG  Recent  Labs  11/21/15 1818 11/21/15 2331 11/22/15 0537  TROPONINI 0.03* 0.03* 0.04*    Recent Labs  11/21/15 1614  TROPIPOC 0.02   No results found for: PROBNP No results found for: CHOL, HDL, LDLCALC, TRIG No results found for: DDIMER No results found for: LIPASE, AMYLASE TSH  Date/Time Value Ref Range Status  11/21/2015 06:19 PM 0.996 0.350 - 4.500 uIU/mL Final   No results found for: VITAMINB12, FOLATE, FERRITIN, TIBC, IRON, RETICCTPCT  Echo: 11/22/15 LV EF: 40% - 45%  ------------------------------------------------------------------- Indications: Atrial fibrillation - 427.31.  ------------------------------------------------------------------- History: PMH: Syncope. Atrial fibrillation. AKI. Hypertension. H/O cirrhosis. Daily smoker. Daily drinker.  -------------------------------------------------------------------  Study Conclusions  - Left ventricle: The cavity size was normal. Systolic function was  mildly to moderately reduced. The estimated ejection fraction was  in the range of 40% to 45%. There is severe hypokinesis of the  entireinferolateral myocardium. There is akinesis of the  basalinferior myocardium. The study was not technically  sufficient to allow evaluation of LV diastolic dysfunction due to  atrial fibrillation. - Aortic valve: Moderate diffuse thickening and calcification.  There was mild regurgitation. - Mitral valve: Calcified annulus. There was mild regurgitation. - Left atrium: The atrium was mildly dilated.   ECG:  AFib  Vent. rate 80 BPM PR interval * ms QRS duration 92 ms QT/QTc 398/460 ms P-R-T axes * -34 196  Radiology:  Dg Chest 2 View  11/21/2015  CLINICAL DATA:  Multiple syncopal episodes. Patient fell, striking head. Initial encounter. EXAM: CHEST  2 VIEW COMPARISON:  None. FINDINGS: 1513 hours. The heart is mildly enlarged. There is aortic tortuosity. The lungs are mildly hyperinflated but clear. There is some  calcification along the right hemidiaphragm. No pleural effusion or pneumothorax is seen. Mild degenerative changes are present throughout the spine. IMPRESSION: No active cardiopulmonary process or acute posttraumatic findings demonstrated. Mild cardiomegaly. Electronically Signed   By: Carey Bullocks M.D.   On: 11/21/2015 15:12   Dg Ribs Unilateral Left  11/22/2015  CLINICAL DATA:  Fall on left side 2 days ago, left lower rib pain EXAM: LEFT RIBS - 2 VIEW COMPARISON:  11/21/2015 FINDINGS: Two views left ribs submitted. No infiltrate or pulmonary edema. No rib fracture is identified. There is no pneumothorax. IMPRESSION: Negative. Electronically Signed   By: Natasha Mead M.D.   On: 11/22/2015 12:38   Ct Head Wo Contrast  11/21/2015  CLINICAL DATA:  Fall, neck pain EXAM: CT HEAD WITHOUT CONTRAST CT CERVICAL SPINE WITHOUT CONTRAST TECHNIQUE: Multidetector CT imaging of the head and cervical spine was performed following the standard protocol without intravenous contrast. Multiplanar CT image reconstructions of the cervical spine were also generated. COMPARISON:  None. FINDINGS: CT HEAD FINDINGS No skull fracture is noted. Paranasal sinuses and mastoid air cells are unremarkable. Atherosclerotic calcifications of carotid siphon are noted. No intracranial hemorrhage, mass effect or midline shift. Moderate cerebral atrophy. Mild periventricular and patchy subcortical white matter decreased attenuation probable due to chronic small vessel ischemic changes. No acute cortical infarction. No mass lesion is noted on this unenhanced scan. CT CERVICAL SPINE FINDINGS Axial images of the cervical spine shows no acute fracture or subluxation. Atherosclerotic calcifications of vertebral arteries are noted. Computer processed images shows no acute fracture or subluxation. Mild degenerative changes C1-C2 articulation. Mild posterior disc bulge at C3-C4 level. Mild anterior spurring lower endplate of C4 and C5 vertebral body. Mild  disc space flattening with minimal posterior disc bulge as at C6-C7 level. No prevertebral soft tissue swelling. Cervical airway is patent. There is no pneumothorax in visualized lung apices. IMPRESSION: 1. No acute intracranial abnormality. There is moderate cerebral atrophy. Mild periventricular and patchy subcortical white matter decreased attenuation probable due to chronic small vessel ischemic changes. 2. No cervical spine acute fracture or subluxation. Mild degenerative changes as described above. Electronically Signed   By: Natasha Mead M.D.   On: 11/21/2015 15:09   Ct Cervical Spine Wo Contrast  11/21/2015  CLINICAL DATA:  Fall, neck pain EXAM: CT HEAD WITHOUT CONTRAST CT CERVICAL SPINE WITHOUT CONTRAST TECHNIQUE: Multidetector CT imaging of the head and cervical spine was performed following the standard protocol without intravenous  contrast. Multiplanar CT image reconstructions of the cervical spine were also generated. COMPARISON:  None. FINDINGS: CT HEAD FINDINGS No skull fracture is noted. Paranasal sinuses and mastoid air cells are unremarkable. Atherosclerotic calcifications of carotid siphon are noted. No intracranial hemorrhage, mass effect or midline shift. Moderate cerebral atrophy. Mild periventricular and patchy subcortical white matter decreased attenuation probable due to chronic small vessel ischemic changes. No acute cortical infarction. No mass lesion is noted on this unenhanced scan. CT CERVICAL SPINE FINDINGS Axial images of the cervical spine shows no acute fracture or subluxation. Atherosclerotic calcifications of vertebral arteries are noted. Computer processed images shows no acute fracture or subluxation. Mild degenerative changes C1-C2 articulation. Mild posterior disc bulge at C3-C4 level. Mild anterior spurring lower endplate of C4 and C5 vertebral body. Mild disc space flattening with minimal posterior disc bulge as at C6-C7 level. No prevertebral soft tissue swelling. Cervical  airway is patent. There is no pneumothorax in visualized lung apices. IMPRESSION: 1. No acute intracranial abnormality. There is moderate cerebral atrophy. Mild periventricular and patchy subcortical white matter decreased attenuation probable due to chronic small vessel ischemic changes. 2. No cervical spine acute fracture or subluxation. Mild degenerative changes as described above. Electronically Signed   By: Natasha Mead M.D.   On: 11/21/2015 15:09   US Renal  11/21/2015  CLINICAL DATA:  Acute kidney injury. EXAM: RENAL / URINARY TRACT ULTRASOUND COMPLETE COMPARISON:  None. FINDINGS: Right Kidney: Length: 11.3 cm. Echogenicity within normal limits. No mass or hydronephrosis visualized. Left Kidney: Length: 11.4 cm. Echogenicity within normal limits. No mass or hydronephrosis visualized. Bladder: Appears normal for degree of bladder distention. IMPRESSION: Normal exam. Electronically Signed   By: Signa Kell M.D.   On: 11/21/2015 19:22    ASSESSMENT AND PLAN:      1. Syncope and collapse - Unknown etiology. Differential include EtOH, arrhthymias, GI or dehydration. No driving for 6 months. ? Monitor at discharge.   2. New onset Afib - Rate controlled. Continue BB. TSH normal. Echo showed  LV EF of 40-45%, severe hypokinesis of the entireinferolateral myocardium. There is akinesis of the basalinferior myocardium, mild AR and mildly dilated LA.  - CHADSVASCs score of 4 (age, HTN, new cardiomyopathy and vascular diseases). Seems not a good candidate for anticoagualtion due to ongoing severe alcohol abuse and needs GI clearance prior to initiation.   3. PAD - Severe LE arterial atherosclerosis with intermittent claudication per note. The patient is very active and complains of leg pain . Requested records from [name]., MD.   4. Acute mild systolic CHF - Echo as above. CXR showed mild cardiomegaly. Appears euvolemic. Consider adding ACE/ARB. Will discuss further evaluation with MD. No  anginal pain.  EKG with TWI in lateral leads. ? Alcohol induced cardiomyopathy vs ischemic given WM abnormality (suspects this ). Likely cath.   4. Hepatic cirrhosis (HCC)/ Hematemesis/ Positive stool guaiac  - Per GI. Hgb normal.   5. HTN - Initially elevated, now stable. Continue BB and amlodipine.   6. AKI (acute kidney injury) (HCC) - Resolved with hydration  7. Tobacco use disorder and alcohol abuse - Advised cessation. Education given.    SignedManson Passey, PA 11/23/2015, 11:01 AM Pager [phone]  Co-Sign MD  Agree with note by Chelsea Aus PA-C  Pt admitted with syncope. Other H/O remarkable for PAD followed in [place] ([facility]), tob abuse, ETOH abuse with cirrhosis. No prior H/O CAD, MI, CVA. His 2D shows mod LV dysfunction with segmental WMA, carotid  dopplers nl. EKG shows septal Qs, Afib with CVR and lat TWI. I do not think he is a candidate for OAC. ? Etiol of syncope. Possible ETOH related, arrhythmogenic especially with new onset afib. Given his WMA on 2 D and his EKG abnormalities, I favor defining his anatomy by cath tomorrow although I'm not sure how compliant he would be with DAPT and worry about bleeding with G + stools. Would also benefit from event monitor or loop recorder. May get EP input as well.    Runell Gess, M.D., FACP, [facility], [facility] [address] [place], [place]  [address]  [phone] 11/23/2015 2:02 PM

## 2015-11-23 NOTE — Consult Note (Signed)
CARDIOLOGY CONSULT NOTE     Primary Care Physician: Pcp Not In System Referring Physician:  Admit Date: 11/21/2015  Reason for consultation: syncope  Derrick Ramos is a 70 y.o. male with a h/o HTN, self reported cirrhosis, daily alcohol use, tobacco abue and PAD who brought by EMS 11/21/15 after syncope episode x 2.   First syncope episode occurred 7/4 when he was walking to his front door. Says he was dizzy prior to syncope. When he woke up by EMS he declined to come in for further evaluation however when he tried to stand up he again passed out. Witness episode. Unknown duration. He had head trauma requiring staple in ED. CT of head and cervical spine did not show acute abnormalities that would be attributed to his head trauma. Seen by GI for "dark emesis" and positive stool guaiac. Says he drinks 8-10 beers per day.  EKG on presentation showed afib at rate of 80 bpm, TWI in lateral leads. No prior EKG to compare. Patient denies hx of arrhthymias. Echo showed LV EF of 40-45%, severe s hypokinesis of the entireinferolateral myocardium. There is akinesis of the basalinferior myocardium, mild AR and mildly dilated LA. SCr resolved with hydration.   The patient denies nausea, vomiting, fever, chest pain, palpitations, shortness of breath, orthopnea, PND, cough, congestion, abdominal pain, hematochezia, melena, lower extremity edema. He is heavy alcohol drinker and tobacco smoker.    Past Medical History  Diagnosis Date  . Hypertension   . GERD (gastroesophageal reflux disease)   . Atrial fibrillation (HCC)     [name] 11/21/2015  . History of blood transfusion 2016    related to GI bleed/notes 11/21/2015  . Hepatic cirrhosis (HCC)     [name] 11/21/2015  . PVD (peripheral vascular disease) (HCC)     [name] 11/21/2015  . Syncope and collapse     required staples to back of head/notes 11/21/2015  . Hematemesis     [name] 11/21/2015  . Acute kidney injury (HCC)     [name] 11/21/2015  . H/O syncope    recurrent episodes/notes 11/21/2015  . GI bleed 2016    [name] 11/21/2015   Past Surgical History  Procedure Laterality Date  . Colonoscopy    . Esophagogastroduodenoscopy      . amLODipine  10 mg Oral Daily  . feeding supplement  1 Container Oral TID BM  . folic acid  1 mg Oral Daily  . metoprolol tartrate  25 mg Oral BID  . multivitamin with minerals  1 tablet Oral Daily  . pantoprazole  40 mg Oral BID  . thiamine  100 mg Oral Daily   Or  . thiamine  100 mg Intravenous Daily      No Known Allergies  Social History   Social History  . Marital Status: Married    Spouse Name: N/A  . Number of Children: N/A  . Years of Education: N/A   Occupational History  . Not on file.   Social History Main Topics  . Smoking status: Current Every Day Smoker -- 0.12 packs/day for 55 years    Types: Cigarettes  . Smokeless tobacco: Never Used  . Alcohol Use: 156.6 oz/week    93 Glasses of wine, 168 Cans of beer per week     Comment: 11/21/2015 daily, 2 - 6 pack tall cans daily beer and gallon wine 2-3 days  . Drug Use: No  . Sexual Activity: No   Other Topics Concern  . Not on file  Social History Narrative  . No narrative on file    History reviewed. No pertinent family history.  ROS- All systems are reviewed and negative except as per the HPI above  Physical Exam: Telemetry: Filed Vitals:   11/23/15 0153 11/23/15 0514 11/23/15 0938 11/23/15 1151  BP:  143/96 157/96 138/84  Pulse:  76 84 75  Temp:  99 F (37.2 C)    TempSrc:  Oral    Resp:  16  18  Height:      Weight: 132 lb 4.8 oz (60.011 kg) 129 lb 8 oz (58.741 kg)    SpO2:  100%  99%    GEN- The patient is well appearing, alert and oriented x 3 today.   Head- normocephalic, atraumatic Eyes-  Sclera clear, conjunctiva pink Ears- hearing intact Oropharynx- clear Neck- supple, no JVP Lymph- no cervical lymphadenopathy Lungs- Clear to ausculation bilaterally, normal work of breathing Heart- irregular rate and  rhythm, no murmurs, rubs or gallops, PMI not laterally displaced GI- soft, NT, ND, + BS Extremities- no clubbing, cyanosis, or edema MS- no significant deformity or atrophy Skin- no rash or lesion Psych- euthymic mood, full affect Neuro- strength and sensation are intact  EKG:  Labs:   Lab Results  Component Value Date   WBC 10.4 11/22/2015   HGB 14.3 11/23/2015   HCT 43.7 11/23/2015   MCV 97.3 11/22/2015   PLT 196 11/22/2015    Recent Labs Lab 11/22/15 0537  NA 139  K 4.6  CL 105  CO2 26  BUN 9  CREATININE 1.21  CALCIUM 8.9  PROT 6.5  BILITOT 1.7*  ALKPHOS 95  ALT 23  AST 31  GLUCOSE 84   Lab Results  Component Value Date   TROPONINI 0.04* 11/22/2015   No results found for: CHOL No results found for: HDL No results found for: LDLCALC No results found for: TRIG No results found for: CHOLHDL No results found for: LDLDIRECT      Echo:- Left ventricle: The cavity size was normal. Systolic function was  mildly to moderately reduced. The estimated ejection fraction was  in the range of 40% to 45%. There is severe hypokinesis of the  entireinferolateral myocardium. There is akinesis of the  basalinferior myocardium. The study was not technically  sufficient to allow evaluation of LV diastolic dysfunction due to  atrial fibrillation. - Aortic valve: Moderate diffuse thickening and calcification.  There was mild regurgitation. - Mitral valve: Calcified annulus. There was mild regurgitation. - Left atrium: The atrium was mildly dilated.  ASSESSMENT AND PLAN:   1. Syncope:  Currently without complaint.  Syncope of unknown cause.  Plan for cardiac cath tomorrow to rule out CAD.  At this time, would favor cardiac event monitor to evaluate for arrhythmogenic causes of syncope.  Can be arranged at discharge by primary team.  Patient is agreeable.    2. Atrial fibrillation: Patient did not know he was in AF.  Asymptomatic and rate controlled at the moment.   If goes into RVR may benefit from beta blockers for rate control which would also help with BP control.  Would avoid CCB due to low EF.  Would benefit from anticoagulation as stroke risks is high.  Would discuss with GI risk of bleeding on anticoagulation as has had heme positive stools.  Per the patient, he has no history of falls on a daily basis.  This patients CHA2DS2-VASc Score and unadjusted Ischemic Stroke Rate (% per year) is equal to 4.8 %  stroke rate/year from a score of 4  Above score calculated as 1 point each if present [CHF, HTN, DM, Vascular=MI/PAD/Aortic Plaque, Age if 65-74, or Male] Above score calculated as 2 points each if present [Age > 75, or Stroke/TIA/TE]   Yarisbel Miranda Jorja Loa, MD 11/23/2015  3:31 PM

## 2015-11-23 NOTE — Progress Notes (Signed)
VASCULAR LAB PRELIMINARY  PRELIMINARY  PRELIMINARY  PRELIMINARY  Carotid duplex completed.    Preliminary report:  Bilateral:  1-39% ICA stenosis lower end of scale.  Vertebral artery flow is antegrade.     Elaine Middleton, RVS 11/23/2015, 12:43 PM

## 2015-11-24 ENCOUNTER — Observation Stay (HOSPITAL_COMMUNITY): Payer: Medicare Other

## 2015-11-24 ENCOUNTER — Other Ambulatory Visit: Payer: Self-pay | Admitting: *Deleted

## 2015-11-24 ENCOUNTER — Encounter (HOSPITAL_COMMUNITY)
Admission: EM | Disposition: A | Discharge status: Home / Self Care | Payer: Self-pay | Source: Home / Self Care | Attending: Internal Medicine

## 2015-11-24 ENCOUNTER — Encounter (HOSPITAL_COMMUNITY): Payer: Self-pay | Admitting: Interventional Cardiology

## 2015-11-24 DIAGNOSIS — Z9861 Coronary angioplasty status: Secondary | ICD-10-CM

## 2015-11-24 DIAGNOSIS — I2510 Atherosclerotic heart disease of native coronary artery without angina pectoris: Secondary | ICD-10-CM | POA: Diagnosis present

## 2015-11-24 DIAGNOSIS — I70219 Atherosclerosis of native arteries of extremities with intermittent claudication, unspecified extremity: Secondary | ICD-10-CM | POA: Diagnosis present

## 2015-11-24 DIAGNOSIS — I25110 Atherosclerotic heart disease of native coronary artery with unstable angina pectoris: Secondary | ICD-10-CM

## 2015-11-24 DIAGNOSIS — F17200 Nicotine dependence, unspecified, uncomplicated: Secondary | ICD-10-CM

## 2015-11-24 HISTORY — PX: CARDIAC CATHETERIZATION: SHX172

## 2015-11-24 LAB — PULMONARY FUNCTION TEST
DL/VA % pred: 76 %
DL/VA: 3.25 ml/min/mmHg/L
DLCO cor % pred: 43 %
DLCO cor: 11.24 ml/min/mmHg
DLCO unc % pred: 42 %
DLCO unc: 10.84 ml/min/mmHg
FEF 25-75 Post: 2.45 L/sec
FEF 25-75 Pre: 2.25 L/sec
FEF2575-%Change-Post: 8 %
FEF2575-%Pred-Post: 124 %
FEF2575-%Pred-Pre: 115 %
FEV1-%Change-Post: 2 %
FEV1-%Pred-Post: 80 %
FEV1-%Pred-Pre: 79 %
FEV1-Post: 1.84 L
FEV1-Pre: 1.81 L
FEV1FVC-%Change-Post: 8 %
FEV1FVC-%Pred-Pre: 120 %
FEV6-%Change-Post: -6 %
FEV6-%Pred-Post: 63 %
FEV6-%Pred-Pre: 67 %
FEV6-Post: 1.84 L
FEV6-Pre: 1.97 L
FEV6FVC-%Pred-Post: 106 %
FEV6FVC-%Pred-Pre: 106 %
FVC-%Change-Post: -6 %
FVC-%Pred-Post: 59 %
FVC-%Pred-Pre: 63 %
FVC-Post: 1.84 L
FVC-Pre: 1.97 L
Post FEV1/FVC ratio: 100 %
Post FEV6/FVC ratio: 100 %
Pre FEV1/FVC ratio: 92 %
Pre FEV6/FVC Ratio: 100 %
RV % pred: 105 %
RV: 2.32 L
TLC % pred: 76 %
TLC: 4.63 L

## 2015-11-24 LAB — CBC
HCT: 40.1 % (ref 39.0–52.0)
Hemoglobin: 13.4 g/dL (ref 13.0–17.0)
MCH: 32.4 pg (ref 26.0–34.0)
MCHC: 33.4 g/dL (ref 30.0–36.0)
MCV: 97.1 fL (ref 78.0–100.0)
Platelets: 161 10*3/uL (ref 150–400)
RBC: 4.13 MIL/uL — ABNORMAL LOW (ref 4.22–5.81)
RDW: 12.8 % (ref 11.5–15.5)
WBC: 7.9 10*3/uL (ref 4.0–10.5)

## 2015-11-24 LAB — HEMOGLOBIN AND HEMATOCRIT, BLOOD
HCT: 39.1 % (ref 39.0–52.0)
HEMATOCRIT: 40.7 % (ref 39.0–52.0)
Hemoglobin: 13.1 g/dL (ref 13.0–17.0)
Hemoglobin: 13.5 g/dL (ref 13.0–17.0)

## 2015-11-24 LAB — CREATININE, SERUM
CREATININE: 0.99 mg/dL (ref 0.61–1.24)
GFR calc Af Amer: >60 mL/min (ref >60)

## 2015-11-24 SURGERY — LEFT HEART CATH AND CORONARY ANGIOGRAPHY
Anesthesia: LOCAL

## 2015-11-24 MED ORDER — LIDOCAINE HCL (PF) 1 % IJ SOLN
INTRAMUSCULAR | Status: DC | PRN
  Administered 2015-11-24: 3 mL

## 2015-11-24 MED ORDER — NITROGLYCERIN 0.4 MG SL SUBL: 0.4000 mg | SUBLINGUAL_TABLET | SUBLINGUAL | Status: DC | PRN

## 2015-11-24 MED ORDER — ACETAMINOPHEN 325 MG PO TABS: 650.0000 mg | ORAL_TABLET | ORAL | Status: DC | PRN

## 2015-11-24 MED ORDER — HEPARIN (PORCINE) IN NACL 2-0.9 UNIT/ML-% IJ SOLN
INTRAMUSCULAR | Status: AC
Start: 2015-11-24 — End: 2015-11-24
  Filled 2015-11-24: qty 1000

## 2015-11-24 MED ORDER — IOPAMIDOL (ISOVUE-370) INJECTION 76%
INTRAVENOUS | Status: AC
  Filled 2015-11-24: qty 100

## 2015-11-24 MED ORDER — CIPROFLOXACIN IN D5W 400 MG/200ML IV SOLN
400.0000 mg | Freq: Two times a day (BID) | INTRAVENOUS | Status: DC
  Administered 2015-11-24 – 2015-11-28 (×8): 400 mg via INTRAVENOUS
  Filled 2015-11-24 (×9): qty 200

## 2015-11-24 MED ORDER — ATORVASTATIN CALCIUM 80 MG PO TABS
80.0000 mg | ORAL_TABLET | Freq: Every day | ORAL | Status: DC
  Administered 2015-11-24 – 2015-11-28 (×4): 80 mg via ORAL
  Filled 2015-11-24 (×5): qty 1

## 2015-11-24 MED ORDER — NITROGLYCERIN 1 MG/10 ML FOR IR/CATH LAB
INTRA_ARTERIAL | Status: DC | PRN
  Administered 2015-11-24: 200 ug via INTRACORONARY

## 2015-11-24 MED ORDER — SODIUM CHLORIDE 0.9 % IV SOLN: 250.0000 mL | INTRAVENOUS | Status: DC | PRN

## 2015-11-24 MED ORDER — LIDOCAINE HCL (PF) 1 % IJ SOLN
INTRAMUSCULAR | Status: AC
  Filled 2015-11-24: qty 30

## 2015-11-24 MED ORDER — LORAZEPAM 1 MG PO TABS: 1.0000 mg | ORAL_TABLET | Freq: Four times a day (QID) | ORAL | Status: AC | PRN

## 2015-11-24 MED ORDER — SODIUM CHLORIDE 0.9% FLUSH
3.0000 mL | INTRAVENOUS | Status: DC | PRN
Start: 2015-11-24 — End: 2015-12-01

## 2015-11-24 MED ORDER — HEPARIN (PORCINE) IN NACL 2-0.9 UNIT/ML-% IJ SOLN
INTRAMUSCULAR | Status: DC | PRN
  Administered 2015-11-24: 1500 mL

## 2015-11-24 MED ORDER — VERAPAMIL HCL 2.5 MG/ML IV SOLN
INTRAVENOUS | Status: DC | PRN
  Administered 2015-11-24: 09:00:00 via INTRA_ARTERIAL

## 2015-11-24 MED ORDER — MIDAZOLAM HCL 2 MG/2ML IJ SOLN
INTRAMUSCULAR | Status: AC
  Filled 2015-11-24: qty 2

## 2015-11-24 MED ORDER — HEPARIN SODIUM (PORCINE) 1000 UNIT/ML IJ SOLN
INTRAMUSCULAR | Status: AC
  Filled 2015-11-24: qty 1

## 2015-11-24 MED ORDER — IOPAMIDOL (ISOVUE-370) INJECTION 76%
INTRAVENOUS | Status: DC | PRN
  Administered 2015-11-24: 110 mL via INTRA_ARTERIAL

## 2015-11-24 MED ORDER — LORAZEPAM 2 MG/ML IJ SOLN: 1.0000 mg | Freq: Four times a day (QID) | INTRAMUSCULAR | Status: AC | PRN

## 2015-11-24 MED ORDER — ALBUTEROL SULFATE (2.5 MG/3ML) 0.083% IN NEBU
2.5000 mg | INHALATION_SOLUTION | Freq: Four times a day (QID) | RESPIRATORY_TRACT | Status: DC
Start: 2015-11-24 — End: 2015-11-25
  Administered 2015-11-24: 2.5 mg via RESPIRATORY_TRACT
  Filled 2015-11-24: qty 3

## 2015-11-24 MED ORDER — HEPARIN (PORCINE) IN NACL 2-0.9 UNIT/ML-% IJ SOLN
INTRAMUSCULAR | Status: AC
  Filled 2015-11-24: qty 500

## 2015-11-24 MED ORDER — NITROGLYCERIN 1 MG/10 ML FOR IR/CATH LAB
INTRA_ARTERIAL | Status: AC
  Filled 2015-11-24: qty 10

## 2015-11-24 MED ORDER — ONDANSETRON HCL 4 MG/2ML IJ SOLN: 4.0000 mg | Freq: Four times a day (QID) | INTRAMUSCULAR | Status: DC | PRN

## 2015-11-24 MED ORDER — ALBUTEROL SULFATE (2.5 MG/3ML) 0.083% IN NEBU
2.5000 mg | INHALATION_SOLUTION | Freq: Once | RESPIRATORY_TRACT | Status: AC
  Administered 2015-11-24: 2.5 mg via RESPIRATORY_TRACT

## 2015-11-24 MED ORDER — FENTANYL CITRATE (PF) 100 MCG/2ML IJ SOLN
INTRAMUSCULAR | Status: DC | PRN
  Administered 2015-11-24: 50 ug via INTRAVENOUS

## 2015-11-24 MED ORDER — VERAPAMIL HCL 2.5 MG/ML IV SOLN
INTRAVENOUS | Status: AC
  Filled 2015-11-24: qty 2

## 2015-11-24 MED ORDER — MOMETASONE FURO-FORMOTEROL FUM 200-5 MCG/ACT IN AERO
2.0000 | INHALATION_SPRAY | Freq: Two times a day (BID) | RESPIRATORY_TRACT | Status: DC
  Administered 2015-11-25 – 2015-11-26 (×4): 2 via RESPIRATORY_TRACT
  Filled 2015-11-24 (×2): qty 8.8

## 2015-11-24 MED ORDER — SODIUM CHLORIDE 0.9% FLUSH
3.0000 mL | Freq: Two times a day (BID) | INTRAVENOUS | Status: DC
  Administered 2015-11-25 – 2015-12-01 (×8): 3 mL via INTRAVENOUS

## 2015-11-24 MED ORDER — HEPARIN SODIUM (PORCINE) 5000 UNIT/ML IJ SOLN
5000.0000 [IU] | Freq: Three times a day (TID) | INTRAMUSCULAR | Status: DC
  Administered 2015-11-24 – 2015-11-29 (×15): 5000 [IU] via SUBCUTANEOUS
  Filled 2015-11-24 (×15): qty 1

## 2015-11-24 MED ORDER — FENTANYL CITRATE (PF) 100 MCG/2ML IJ SOLN
INTRAMUSCULAR | Status: AC
  Filled 2015-11-24: qty 2

## 2015-11-24 MED ORDER — IOPAMIDOL (ISOVUE-370) INJECTION 76%
INTRAVENOUS | Status: AC
  Filled 2015-11-24: qty 50

## 2015-11-24 MED ORDER — OXYCODONE-ACETAMINOPHEN 5-325 MG PO TABS
1.0000 | ORAL_TABLET | ORAL | Status: DC | PRN
  Administered 2015-11-24 – 2015-11-27 (×2): 2 via ORAL
  Administered 2015-11-28: 1 via ORAL
  Administered 2015-11-29: 2 via ORAL
  Filled 2015-11-24 (×2): qty 2
  Filled 2015-11-24: qty 1
  Filled 2015-11-24: qty 2

## 2015-11-24 MED ORDER — HEPARIN SODIUM (PORCINE) 1000 UNIT/ML IJ SOLN
INTRAMUSCULAR | Status: DC | PRN
  Administered 2015-11-24: 3000 [IU] via INTRAVENOUS

## 2015-11-24 MED ORDER — SODIUM CHLORIDE 0.9 % WEIGHT BASED INFUSION: 3.0000 mL/kg/h | INTRAVENOUS | Status: DC

## 2015-11-24 MED ORDER — MIDAZOLAM HCL 2 MG/2ML IJ SOLN
INTRAMUSCULAR | Status: DC | PRN
  Administered 2015-11-24: 1 mg via INTRAVENOUS

## 2015-11-24 SURGICAL SUPPLY — 9 items
CATH INFINITI 5 FR JL3.5 (CATHETERS) ×2 IMPLANT
CATH INFINITI JR4 5F (CATHETERS) ×2 IMPLANT
DEVICE RAD COMP TR BAND LRG (VASCULAR PRODUCTS) ×2 IMPLANT
GLIDESHEATH SLEND A-KIT 6F 22G (SHEATH) ×2 IMPLANT
KIT HEART LEFT (KITS) ×2 IMPLANT
PACK CARDIAC CATHETERIZATION (CUSTOM PROCEDURE TRAY) ×2 IMPLANT
TRANSDUCER W/STOPCOCK (MISCELLANEOUS) ×2 IMPLANT
TUBING CIL FLEX 10 FLL-RA (TUBING) ×2 IMPLANT
WIRE SAFE-T 1.5MM-J .035X260CM (WIRE) ×2 IMPLANT

## 2015-11-24 NOTE — Care Management Note (Signed)
Case Management Note  Patient Details  Name: Derrick Ramos MRN: [id number] Date of Birth: [birth year]  Subjective/Objective:                    Action/Plan: Pt s/p cath and awaiting CVTS consult. CM following for discharge needs.   Expected Discharge Date:                  Expected Discharge Plan:     In-House Referral:     Discharge planning Services     Post Acute Care Choice:    Choice offered to:     DME Arranged:    DME Agency:     HH Arranged:    HH Agency:     Status of Service:     If discussed at [employer], dates discussed:    Additional Comments:  Kermit Balo, RN 11/24/2015, 4:34 PM

## 2015-11-24 NOTE — Plan of Care (Signed)
Problem: Consults Goal: Cardiac Cath Patient Education (See Patient Education module for education specifics.)  Outcome: Completed/Met Date Met:  11/24/15 Patient watch video. Family refused to watch. Wife has had one in the past. And daughter stated she knew about the procedure.

## 2015-11-24 NOTE — Progress Notes (Signed)
Day of Surgery Procedure(s) (LRB): Left Heart Cath and Coronary Angiography (N/A) Subjective: Patient examined, echocardiogram and cardiac catheterization personally reviewed Chronically ill 70 year old patient with peripheral rash or disease and heavy alcohol abuse history with self-reported cirrhosis. Admitted after syncopal episodes and found to have normal head CT scan [mild atrophy] with 80-90% proximal LAD stenosis. Also having possible GI bleeding. GI evaluation in progress. CABG 1 recommended by cardiology as his best therapy for his heart disease. With history of cirrhosis and GI bleeding I am uncertain that he will safely tolerate high dose heparin needed for heart surgery. We'll review his CT scan of chest to assess his liver and palmar he status. Poor A function is moderate to severely reduced with diffusion capacity of 43%, forced vital capacity 60% Objective: Vital signs in last 24 hours: Temp:  [98.5 F (36.9 C)-98.7 F (37.1 C)] 98.5 F (36.9 C) (07/07 1001) Pulse Rate:  [0-82] 73 (07/07 1347) Cardiac Rhythm:  [-] Normal sinus rhythm (07/07 0700) Resp:  [13-22] 14 (07/07 0942) BP: (113-182)/(69-105) 113/69 mmHg (07/07 1347) SpO2:  [99 %-100 %] 100 % (07/07 1347) Weight:  [129 lb 4.8 oz (58.65 kg)] 129 lb 4.8 oz (58.65 kg) (07/07 0428)  Hemodynamic parameters for last 24 hours:  currently in sinus rhythm, reportedly patient has been in atrial fibrillation on the time of admission  Intake/Output from previous day: 07/06 0701 - 07/07 0700 In: 840 [P.O.:840] Out: 625 [Urine:625] Intake/Output this shift:        Physical Exam  General: Chronically ill frail and weak-appearing AA male no acute distress HEENT: Normocephalic proptosis right greater than left, muddy sclera, pupils equal , dentition poor Neck: Supple without JVD, adenopathy, or bruit Chest: Clear to auscultation, symmetrical breath sounds, no rhonchi, no tenderness             or  deformity Cardiovascular: Regular rate and rhythm, no murmur, no gallop, peripheral pulses         not    palpable in lower extremities Abdomen:  Soft, nontender, no palpable mass or organomegaly Extremities: Warm, well-perfused, no clubbing cyanosis edema or tenderness,              Mild venous stasis changes of the legs Rectal/GU: Deferred Neuro: Grossly non--focal and symmetrical throughout Skin: Clean and dry without rash or ulceration   Lab Results:  Recent Labs  11/22/15 0537  11/24/15 1113 11/24/15 1908  WBC 10.4  --  7.9  --   HGB 14.5  < > 13.4 13.5  HCT 43.4  < > 40.1 40.7  PLT 196  --  161  --   < > = values in this interval not displayed. BMET:  Recent Labs  11/22/15 0537 11/23/15 1600 11/24/15 1113  NA 139 137  --   K 4.6 4.1  --   CL 105 106  --   CO2 26 25  --   GLUCOSE 84 109*  --   BUN 9 <5*  --   CREATININE 1.21 0.95 0.99  CALCIUM 8.9 8.7*  --     PT/INR:  Recent Labs  11/23/15 1029  LABPROT 13.4  INR 1.00   ABG No results found for: PHART, HCO3, TCO2, ACIDBASEDEF, O2SAT CBG (last 3)  No results for input(s): GLUCAP in the last 72 hours.  Assessment/Plan: S/P Procedure(s) (LRB): Left Heart Cath and Coronary Angiography (N/A) Patient's coronary artery disease would benefit from left IMA to LAD however his liver and overall debility may preclude overall  benefit from CABG. We'll follow      Kathlee Nations Trigt III 11/24/2015

## 2015-11-24 NOTE — Progress Notes (Signed)
Physical Therapy Treatment Patient Details Name: Derrick Ramos MRN: [id number] DOB: 18-Dec-1945 Today's Date: 11/24/2015    History of Present Illness 70 y.o. gentleman with a history of cirrhosis (self-reported), daily EtOH use, active tobacco use, PVD, and HTN who presented to the ED after multiple episodes of syncope. Syncopal episode on 11-21-15 resulted in head trauma and laceration requiring staple in the ED.    PT Comments    Patient demonstrated increased balance deficits this session requiring min A for ambulation and use of RW. LOB X3. VSS. Continue to progress as tolerated.   Follow Up Recommendations  No PT follow up;Supervision for mobility/OOB     Equipment Recommendations  Rolling walker with 5" wheels    Recommendations for Other Services       Precautions / Restrictions Precautions Precautions: Fall Restrictions Weight Bearing Restrictions: No    Mobility  Bed Mobility Overal bed mobility: Modified Independent Bed Mobility: Supine to Sit     Supine to sit: Modified independent (Device/Increase time);HOB elevated     General bed mobility comments: HOB elevated; increased time  Transfers Overall transfer level: Needs assistance Equipment used: None Transfers: Sit to/from Stand Sit to Stand: Min guard;Min assist         General transfer comment: min guard for stand and min a upon standing for balance; posterior lean in standing  Ambulation/Gait Ambulation/Gait assistance: Min assist Ambulation Distance (Feet): 400 Feet Assistive device: Rolling walker (2 wheeled) Gait Pattern/deviations: Step-through pattern;Decreased stride length;Staggering left;Staggering right Gait velocity: decreased   General Gait Details: slow, unsteady gait with LOB X3 requiring min A to regain balance; initially ambulated without AD and then with RW for safety; pt more steady with RW but still with LOB with head turns and directional changes   Stairs             Wheelchair Mobility    Modified Rankin (Stroke Patients Only)       Balance Overall balance assessment: Needs assistance Sitting-balance support: No upper extremity supported;Feet supported Sitting balance-Leahy Scale: Good     Standing balance support: During functional activity;Single extremity supported Standing balance-Leahy Scale: Poor                      Cognition Arousal/Alertness: Awake/alert Behavior During Therapy: Flat affect Overall Cognitive Status: Within Functional Limits for tasks assessed                      Exercises      General Comments        Pertinent Vitals/Pain Pain Assessment: Faces Faces Pain Scale: Hurts a little bit Pain Location: L flank Pain Descriptors / Indicators: Sore Pain Intervention(s): Monitored during session;Repositioned    Home Living                      Prior Function            PT Goals (current goals can now be found in the care plan section) Acute Rehab PT Goals Patient Stated Goal: home Progress towards PT goals: Progressing toward goals    Frequency  Min 3X/week    PT Plan Current plan remains appropriate    Co-evaluation             End of Session Equipment Utilized During Treatment: Gait belt Activity Tolerance: Patient tolerated treatment well Patient left: Other (comment) (respiratory took pt in w/c upon arrival back to room)     Time: [phone]  PT Time Calculation (min) (ACUTE ONLY): 27 min  Charges:  $Gait Training: 23-37 mins                    G Codes:      Derek Mound, PTA Pager: [phone]   11/24/2015, 3:03 PM

## 2015-11-24 NOTE — Progress Notes (Signed)
[facility] Progress Note  Subjective: No further complaints of hematemesis. Hemoglobin stable at 13.2  Objective: Vital signs in last 24 hours: Temp:  [98.5 F (36.9 C)-98.7 F (37.1 C)] 98.5 F (36.9 C) (07/07 1001) Pulse Rate:  [0-82] 72 (07/07 1026) Resp:  [13-22] 14 (07/07 0942) BP: (133-182)/(76-105) 160/84 mmHg (07/07 1026) SpO2:  [99 %-100 %] 100 % (07/07 1026) Weight:  [58.65 kg (129 lb 4.8 oz)] 58.65 kg (129 lb 4.8 oz) (07/07 0428) Weight change: -1.361 kg (-3 lb)   PE: Unchanged  Lab Results: Results for orders placed or performed during the hospital encounter of 11/21/15 (from the past 24 hour(s))  Basic metabolic panel     Status: Abnormal   Collection Time: 11/23/15  4:00 PM  Result Value Ref Range   Sodium 137 135 - 145 mmol/L   Potassium 4.1 3.5 - 5.1 mmol/L   Chloride 106 101 - 111 mmol/L   CO2 25 22 - 32 mmol/L   Glucose, Bld 109 (H) 65 - 99 mg/dL   BUN <5 (L) 6 - 20 mg/dL   Creatinine, Ser 9.32 0.61 - 1.24 mg/dL   Calcium 8.7 (L) 8.9 - 10.3 mg/dL   GFR calc non Af Amer >60 >60 mL/min   GFR calc Af Amer >60 >60 mL/min   Anion gap 6 5 - 15  Hemoglobin and hematocrit, blood     Status: None   Collection Time: 11/23/15  7:31 PM  Result Value Ref Range   Hemoglobin 13.9 13.0 - 17.0 g/dL   HCT 67.1 24.5 - 80.9 %  Hemoglobin and hematocrit, blood     Status: None   Collection Time: 11/24/15  7:06 AM  Result Value Ref Range   Hemoglobin 13.1 13.0 - 17.0 g/dL   HCT 98.3 38.2 - 50.5 %    Studies/Results: Dg Ribs Unilateral Left  11/22/2015  CLINICAL DATA:  Fall on left side 2 days ago, left lower rib pain EXAM: LEFT RIBS - 2 VIEW COMPARISON:  11/21/2015 FINDINGS: Two views left ribs submitted. No infiltrate or pulmonary edema. No rib fracture is identified. There is no pneumothorax. IMPRESSION: Negative. Electronically Signed   By: Natasha Mead M.D.   On: 11/22/2015 12:38      Assessment: Report of possible hematemesis (self described) with no  active GI bleeding apparently been stable normal hemoglobin but with guaiac positive stool.  Plan: Await records from [place] which are still pending regarding a supposedly admission in 2016 where he may have had an EGD and colonoscopy. Would treat with PPI Okay to resume aspirin and Plavix Please call if records obtained or signs of active bleeding. Otherwise we'll follow at a distance. Dr. Matthias Hughs covering this weekend.    Derrick Ramos C 11/24/2015, 10:35 AM  Pager [phone] If no answer or after 5 PM call [phone]

## 2015-11-24 NOTE — Progress Notes (Signed)
Progress Note    Derrick Ramos  ZOX:[id number] DOB: 1946-04-24  DOA: 11/21/2015 PCP: Pcp Not In System    Brief Narrative:   Derrick Ramos is an 70 y.o. male gentleman with a history of cirrhosis (self-reported), daily EtOH use, active tobacco use, PVD, and HTN who presents to the ED accompanied by his wife and daughter who has had recurrent syncopal episodes. Cardiology consulted and he underwent cardiac cath on 7/7 showing LAD disease. CT surgery consult pending.   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 Coffee ground emesis.  Hemoglobin stable.  GI consulted for further eval. Recommended no further work up at this time as he had EGD and colonoscopy at [place] one year ago. Will try to get records . Advance diet as tolerated and currently on PPI oral bid.  Hemoglobin stable around 13.  Pt is cleared from GI perspective to be on dual anti platelet therapy.     New onset / Paroxysmal atrial fibrillation: Echo shows Systolic function was mildly to moderately reduced. The estimated ejection fraction was  in the range of 40% to 45%. There is severe hypokinesis of the entire inferolateral myocardium. There is akinesis of the  basalinferior myocardium. The study was not technically sufficient to allow evaluation of LV diastolic dysfunction due to  atrial fibrillation. [place] score is 3.  But because of his ETOH abuse, not a good candidate for anticoagulation.  Cardiology consulted, underwent cath shows 80% LAD stenosis, circumflex and irght coronary are non  obstructive.  Recommended TCTS to see .  Appreciate EP recommendations.  Discussed with the patient.     Aki: Improved with gentle hydration. Stopped IV fluids.  Recheck renal parameters are within normal limits.    Hypertension: well controlled.     Liver Cirrhosis: Liver function panel wnl.    etoh abuse: On CIWA protocol.  No signs of withdrawal.   Left rib pain: Rib series ordered and negative for fracture.  Pain control .  Avoid NSAIDS.   Abnormal UA:  Urine cultures not sent on admission. He is asymptomatic. Denies any dysuria.  Continue to monitor.   Fall and scalp wound: No bleeding.     Family Communication/Anticipated D/C date and plan/Code Status   DVT prophylaxis: Lovenox ordered. Code Status: Full Code.  Family Communication: none at bedside.  Disposition Plan: pending further investigation. Pending PT evaluation.   Medical Consultants:    Gastroenterology.   Cardiology  EP  TCTS.   Procedures:  echocardiogram Cardiac catheterization.  Anti-Infectives:   Anti-infectives    Start     Dose/Rate Route Frequency Ordered Stop   11/24/15 1400  ciprofloxacin (CIPRO) IVPB 400 mg     400 mg 200 mL/hr over 60 Minutes Intravenous Every 12 hours 11/24/15 1330        Subjective:    Derrick Ramos  denies any chest pain or sob. Denies any dysuria. Wants to go home tonight.   Objective:    Filed Vitals:   11/24/15 1246 11/24/15 1306 11/24/15 1327 11/24/15 1347  BP: 138/78 129/71 126/71 113/69  Pulse: 74 69 75 73  Temp:      TempSrc:      Resp:      Height:      Weight:      SpO2: 100% 100% 100% 100%    Intake/Output Summary (Last 24 hours) at 11/24/15 1720 Last data filed at 11/24/15 1651  Gross per 24 hour  Intake    480 ml  Output    450 ml  Net     30 ml   Filed Weights   11/23/15 0153 11/23/15 0514 11/24/15 0428  Weight: 60.011 kg (132 lb 4.8 oz) 58.741 kg (129 lb 8 oz) 58.65 kg (129 lb 4.8 oz)    Exam: General  exam: Appears calm and comfortable.  Respiratory system: Clear to auscultation. Respiratory effort normal. Cardiovascular system: S1 & S2 heard, RRR. No JVD,  rubs, gallops or clicks. No murmurs. Gastrointestinal system:tender abdomen, soft, non distended. Pain in the left rib area Central nervous system: Alert and oriented. No focal neurological deficits. Extremities: No clubbing, edema, or cyanosis. Skin: No rashes, lesions or ulcers .   Data Reviewed:   I have personally reviewed following labs and imaging studies:  Labs: Basic Metabolic Panel:  Recent Labs Lab 11/21/15 1422 11/22/15 0537 11/23/15 1600 11/24/15 1113  NA 136 139 137  --   K 3.3* 4.6 4.1  --   CL 100* 105 106  --   CO2 --   GLUCOSE 122* 84 109*  --   BUN 9 9 <5*  --   CREATININE 2.28* 1.21 0.95 0.99  CALCIUM 9.2 8.9 8.7*  --    GFR Estimated Creatinine Clearance: 56.8 mL/min (by C-G formula based on Cr of 0.99). Liver Function Tests:  Recent Labs Lab 11/21/15 1818 11/22/15 0537  AST 41 31  ALT 26 23  ALKPHOS 100 95  BILITOT 1.6* 1.7*  PROT 7.1 6.5  ALBUMIN 3.4* 3.0*   No results for input(s): LIPASE, AMYLASE in the last 168 hours. No results for input(s): AMMONIA in the last 168 hours. Coagulation profile  Recent Labs Lab 11/21/15 1831 11/23/15 1029  INR 1.15 1.00    CBC:  Recent Labs Lab 11/21/15 1422  11/22/15 0537 11/22/15 1919 11/23/15 0647 11/23/15 1931 11/24/15 0706 11/24/15 1113  WBC 13.1*  --  10.4  --   --   --   --  7.9  HGB 15.8  < > 14.5 14.2 14.3 13.9 13.1 13.4  HCT 46.2  < > 43.4 42.5 43.7 41.0 39.1 40.1  MCV 97.7  --  97.3  --   --   --   --  97.1  PLT 214  --  196  --   --   --   --  161  < > = values in this interval not displayed. Cardiac Enzymes:  Recent Labs Lab 11/21/15 1818 11/21/15 2331 11/22/15 0537  TROPONINI 0.03* 0.03* 0.04*   BNP (last 3 results) No results for input(s): PROBNP in the last 8760 hours. CBG: No results for  input(s): GLUCAP in the last 168 hours. D-Dimer: No results for input(s): DDIMER in the last 72 hours. Hgb A1c:  Recent Labs  11/21/15 1823  HGBA1C 4.7*   Lipid Profile: No results for input(s): CHOL, HDL, LDLCALC, TRIG, CHOLHDL, LDLDIRECT in the last 72 hours. Thyroid function studies:  Recent Labs  11/21/15 1819  TSH 0.996   Anemia work up: No results for input(s):  VITAMINB12, FOLATE, FERRITIN, TIBC, IRON, RETICCTPCT in the last 72 hours. Sepsis Labs:  Recent Labs Lab 11/21/15 1422 11/22/15 0537 11/24/15 1113  WBC 13.1* 10.4 7.9   Urine analysis:    Component Value Date/Time   COLORURINE ORANGE* 11/21/2015 2148   APPEARANCEUR TURBID* 11/21/2015 2148   LABSPEC 1.026 11/21/2015 2148   PHURINE 5.0 11/21/2015 2148   GLUCOSEU 100* 11/21/2015 2148   HGBUR NEGATIVE 11/21/2015 2148   BILIRUBINUR MODERATE* 11/21/2015 2148   KETONESUR 15* 11/21/2015 2148   PROTEINUR 100* 11/21/2015 2148   NITRITE POSITIVE* 11/21/2015 2148   LEUKOCYTESUR SMALL* 11/21/2015 2148   Microbiology No results found for this or any previous visit (from the past 240 hour(s)).  Radiology: No results found.  Medications:   . amLODipine  10 mg Oral Daily  . atorvastatin  80 mg Oral q1800  . ciprofloxacin  400 mg Intravenous Q12H  . feeding supplement  1 Container Oral TID BM  . folic acid  1 mg Oral Daily  . heparin  5,000 Units Subcutaneous Q8H  . metoprolol tartrate  25 mg Oral BID  . multivitamin with minerals  1 tablet Oral Daily  . pantoprazole  40 mg Oral BID  . sodium chloride flush  3 mL Intravenous Q12H  . thiamine  100 mg Oral Daily   Continuous Infusions:    Time spent: 30 minutes     Derrick Ramos  [facility] [phone].  *Please refer to [email or web address], password [email or web address] to get updated schedule on who will round on this patient, as hospitalists switch teams weekly. If 7PM-7AM, please contact night-coverage at [email or web address], password [email or web address] for any overnight  needs.  11/24/2015, 5:20 PM

## 2015-11-24 NOTE — Progress Notes (Signed)
Pt returned from cath lab. Placed on tele. VSS. R radial site level 0. Report received from cath lab as pt was being rolled down the hallway. Call bell within reach. Will continue to monitor.

## 2015-11-24 NOTE — H&P (View-Only) (Signed)
CARDIOLOGY CONSULT NOTE   Patient ID: Derrick Ramos MRN: [id number] DOB/AGE: 10/23/1945 70 y.o.  Admit date: 11/21/2015  Primary Physician   Pcp Not In System Primary Cardiologist  New Reason for Consultation   Syncope Requesting Physician  Dr. Blake Divine  HPI: Derrick Ramos is a 70 y.o. male with a history of HTN, self reported cirrhosis, daily alcohol use, tobacco abue and PAD who brought by EMS 11/21/15 after syncope episode x 2.   Per PCP note in Care Everywhere 10/12/15. Pt has PAD and recently evaluated for surgical procedure. Diagnosed with severe LE arterial atherosclerosis with intermittent claudication, might have lost his leg if not so active. Seems seen by [name]., MD.  The patient is poor historian. No family at bedside.   First syncope episode occurred 7/4 when he was walking to his front door. ? Dizziness. When he woke up by EMS he declined to come in for further evaluation however when he tried to stand up he again passed out. Witness episode. Unknown duration. He had head trauma requiring staple in ED. CT of head and cervical spine did not show acute abnormalities that would be attributed to his head trauma. Seen by GI for "dark emesis" and positive stool guaiac. Pending further evaluation. Renal US normal.  EKG on presentation showed afib at rate of 80 bpm, TWI in lateral leads. No prior EKG to compare. Patient denies hx of arrhthymias. Telemetry showed afib at rate of 70s. Troponin 0.03-->0.03-->0.03. TSH normal. Echo showed LV EF of 40-45%, severe s hypokinesis of the entireinferolateral myocardium. There is akinesis of the basalinferior myocardium, mild AR and mildly dilated LA. SCr resolved with hydration.   The patient denies nausea, vomiting, fever, chest pain, palpitations, shortness of breath, orthopnea, PND, cough, congestion, abdominal pain, hematochezia, melena, lower extremity edema. He is heavy alcohol drinker and tobacco smoker.   Past Medical History    Diagnosis Date  . Hypertension   . GERD (gastroesophageal reflux disease)   . Atrial fibrillation (HCC)     [name] 11/21/2015  . History of blood transfusion 2016    related to GI bleed/notes 11/21/2015  . Hepatic cirrhosis (HCC)     [name] 11/21/2015  . PVD (peripheral vascular disease) (HCC)     [name] 11/21/2015  . Syncope and collapse     required staples to back of head/notes 11/21/2015  . Hematemesis     [name] 11/21/2015  . Acute kidney injury (HCC)     [name] 11/21/2015  . H/O syncope     recurrent episodes/notes 11/21/2015  . GI bleed 2016    [name] 11/21/2015     Past Surgical History  Procedure Laterality Date  . Colonoscopy    . Esophagogastroduodenoscopy      No Known Allergies  I have reviewed the patient's current medications . amLODipine  10 mg Oral Daily  . feeding supplement  1 Container Oral TID BM  . folic acid  1 mg Oral Daily  . metoprolol tartrate  25 mg Oral BID  . multivitamin with minerals  1 tablet Oral Daily  . pantoprazole  40 mg Oral BID  . thiamine  100 mg Oral Daily   Or  . thiamine  100 mg Intravenous Daily   . sodium chloride 75 mL/hr at 11/21/15 1942   acetaminophen, LORazepam **OR** LORazepam, ondansetron (ZOFRAN) IV  Prior to Admission medications   Medication Sig Start Date End Date Taking? Authorizing Provider  amLODipine (NORVASC) 10 MG tablet Take 1 tablet by  mouth daily. 10/12/15  Yes Historical Provider, MD  loratadine (CLARITIN) 10 MG tablet Take 1 tablet by mouth daily. 10/12/15 01/10/16 Yes Historical Provider, MD  metoprolol tartrate (LOPRESSOR) 25 MG tablet Take 1 tablet by mouth 2 (two) times daily. 10/12/15  Yes Historical Provider, MD  ranitidine (ZANTAC) 150 MG capsule Take 1 capsule by mouth 2 (two) times daily.   Yes Historical Provider, MD     Social History   Social History  . Marital Status: Married    Spouse Name: N/A  . Number of Children: N/A  . Years of Education: N/A   Occupational History  . Not on file.    Social History Main Topics  . Smoking status: Current Every Day Smoker -- 0.12 packs/day for 55 years    Types: Cigarettes  . Smokeless tobacco: Never Used  . Alcohol Use: 156.6 oz/week    93 Glasses of wine, 168 Cans of beer per week     Comment: 11/21/2015 daily, 2 - 6 pack tall cans daily beer and gallon wine 2-3 days  . Drug Use: No  . Sexual Activity: No   Other Topics Concern  . Not on file   Social History Narrative  . No narrative on file    Denies family history of cardiac disease.   ROS:  Full 14 point review of systems complete and found to be negative unless listed above.  Physical Exam: Blood pressure 157/96, pulse 84, temperature 99 F (37.2 C), temperature source Oral, resp. rate 16, height 5\' 6"  (1.676 m), weight 129 lb 8 oz (58.741 kg), SpO2 100 %.  General: Well developed, well nourished, male in no acute distress Head: Eyes PERRLA, No xanthomas. Normocephalic and atraumatic, oropharynx without edema or exudate.  Lungs: Resp regular and unlabored, CTA. Heart: Ir Ir no s3, s4, or murmurs..   Neck: ? carotid bruits. No lymphadenopathy.  ? JVD. Abdomen: Bowel sounds present, abdomen soft and non-tender without masses or hernias noted. Msk:  No spine or cva tenderness. No weakness, no joint deformities or effusions. Extremities: No clubbing, cyanosis or edema. DP/PT/Radials 2+ and equal bilaterally. Neuro: Alert and oriented X 3. No focal deficits noted. Psych:  Good affect, responds appropriately Skin: No rashes or lesions noted.  Labs:   Lab Results  Component Value Date   WBC 10.4 11/22/2015   HGB 14.3 11/23/2015   HCT 43.7 11/23/2015   MCV 97.3 11/22/2015   PLT 196 11/22/2015    Recent Labs  11/21/15 1831  INR 1.15    Recent Labs Lab 11/22/15 0537  NA 139  K 4.6  CL 105  CO2 26  BUN 9  CREATININE 1.21  CALCIUM 8.9  PROT 6.5  BILITOT 1.7*  ALKPHOS 95  ALT 23  AST 31  GLUCOSE 84  ALBUMIN 3.0*   No results found for: MG  Recent  Labs  11/21/15 1818 11/21/15 2331 11/22/15 0537  TROPONINI 0.03* 0.03* 0.04*    Recent Labs  11/21/15 1614  TROPIPOC 0.02   No results found for: PROBNP No results found for: CHOL, HDL, LDLCALC, TRIG No results found for: DDIMER No results found for: LIPASE, AMYLASE TSH  Date/Time Value Ref Range Status  11/21/2015 06:19 PM 0.996 0.350 - 4.500 uIU/mL Final   No results found for: VITAMINB12, FOLATE, FERRITIN, TIBC, IRON, RETICCTPCT  Echo: 11/22/15 LV EF: 40% - 45%  ------------------------------------------------------------------- Indications: Atrial fibrillation - 427.31.  ------------------------------------------------------------------- History: PMH: Syncope. Atrial fibrillation. AKI. Hypertension. H/O cirrhosis. Daily smoker. Daily drinker.  -------------------------------------------------------------------  Study Conclusions  - Left ventricle: The cavity size was normal. Systolic function was  mildly to moderately reduced. The estimated ejection fraction was  in the range of 40% to 45%. There is severe hypokinesis of the  entireinferolateral myocardium. There is akinesis of the  basalinferior myocardium. The study was not technically  sufficient to allow evaluation of LV diastolic dysfunction due to  atrial fibrillation. - Aortic valve: Moderate diffuse thickening and calcification.  There was mild regurgitation. - Mitral valve: Calcified annulus. There was mild regurgitation. - Left atrium: The atrium was mildly dilated.   ECG:  AFib  Vent. rate 80 BPM PR interval * ms QRS duration 92 ms QT/QTc 398/460 ms P-R-T axes * -34 196  Radiology:  Dg Chest 2 View  11/21/2015  CLINICAL DATA:  Multiple syncopal episodes. Patient fell, striking head. Initial encounter. EXAM: CHEST  2 VIEW COMPARISON:  None. FINDINGS: 1513 hours. The heart is mildly enlarged. There is aortic tortuosity. The lungs are mildly hyperinflated but clear. There is some  calcification along the right hemidiaphragm. No pleural effusion or pneumothorax is seen. Mild degenerative changes are present throughout the spine. IMPRESSION: No active cardiopulmonary process or acute posttraumatic findings demonstrated. Mild cardiomegaly. Electronically Signed   By: Carey Bullocks M.D.   On: 11/21/2015 15:12   Dg Ribs Unilateral Left  11/22/2015  CLINICAL DATA:  Fall on left side 2 days ago, left lower rib pain EXAM: LEFT RIBS - 2 VIEW COMPARISON:  11/21/2015 FINDINGS: Two views left ribs submitted. No infiltrate or pulmonary edema. No rib fracture is identified. There is no pneumothorax. IMPRESSION: Negative. Electronically Signed   By: Natasha Mead M.D.   On: 11/22/2015 12:38   Ct Head Wo Contrast  11/21/2015  CLINICAL DATA:  Fall, neck pain EXAM: CT HEAD WITHOUT CONTRAST CT CERVICAL SPINE WITHOUT CONTRAST TECHNIQUE: Multidetector CT imaging of the head and cervical spine was performed following the standard protocol without intravenous contrast. Multiplanar CT image reconstructions of the cervical spine were also generated. COMPARISON:  None. FINDINGS: CT HEAD FINDINGS No skull fracture is noted. Paranasal sinuses and mastoid air cells are unremarkable. Atherosclerotic calcifications of carotid siphon are noted. No intracranial hemorrhage, mass effect or midline shift. Moderate cerebral atrophy. Mild periventricular and patchy subcortical white matter decreased attenuation probable due to chronic small vessel ischemic changes. No acute cortical infarction. No mass lesion is noted on this unenhanced scan. CT CERVICAL SPINE FINDINGS Axial images of the cervical spine shows no acute fracture or subluxation. Atherosclerotic calcifications of vertebral arteries are noted. Computer processed images shows no acute fracture or subluxation. Mild degenerative changes C1-C2 articulation. Mild posterior disc bulge at C3-C4 level. Mild anterior spurring lower endplate of C4 and C5 vertebral body. Mild  disc space flattening with minimal posterior disc bulge as at C6-C7 level. No prevertebral soft tissue swelling. Cervical airway is patent. There is no pneumothorax in visualized lung apices. IMPRESSION: 1. No acute intracranial abnormality. There is moderate cerebral atrophy. Mild periventricular and patchy subcortical white matter decreased attenuation probable due to chronic small vessel ischemic changes. 2. No cervical spine acute fracture or subluxation. Mild degenerative changes as described above. Electronically Signed   By: Natasha Mead M.D.   On: 11/21/2015 15:09   Ct Cervical Spine Wo Contrast  11/21/2015  CLINICAL DATA:  Fall, neck pain EXAM: CT HEAD WITHOUT CONTRAST CT CERVICAL SPINE WITHOUT CONTRAST TECHNIQUE: Multidetector CT imaging of the head and cervical spine was performed following the standard protocol without intravenous  contrast. Multiplanar CT image reconstructions of the cervical spine were also generated. COMPARISON:  None. FINDINGS: CT HEAD FINDINGS No skull fracture is noted. Paranasal sinuses and mastoid air cells are unremarkable. Atherosclerotic calcifications of carotid siphon are noted. No intracranial hemorrhage, mass effect or midline shift. Moderate cerebral atrophy. Mild periventricular and patchy subcortical white matter decreased attenuation probable due to chronic small vessel ischemic changes. No acute cortical infarction. No mass lesion is noted on this unenhanced scan. CT CERVICAL SPINE FINDINGS Axial images of the cervical spine shows no acute fracture or subluxation. Atherosclerotic calcifications of vertebral arteries are noted. Computer processed images shows no acute fracture or subluxation. Mild degenerative changes C1-C2 articulation. Mild posterior disc bulge at C3-C4 level. Mild anterior spurring lower endplate of C4 and C5 vertebral body. Mild disc space flattening with minimal posterior disc bulge as at C6-C7 level. No prevertebral soft tissue swelling. Cervical  airway is patent. There is no pneumothorax in visualized lung apices. IMPRESSION: 1. No acute intracranial abnormality. There is moderate cerebral atrophy. Mild periventricular and patchy subcortical white matter decreased attenuation probable due to chronic small vessel ischemic changes. 2. No cervical spine acute fracture or subluxation. Mild degenerative changes as described above. Electronically Signed   By: Natasha Mead M.D.   On: 11/21/2015 15:09   US Renal  11/21/2015  CLINICAL DATA:  Acute kidney injury. EXAM: RENAL / URINARY TRACT ULTRASOUND COMPLETE COMPARISON:  None. FINDINGS: Right Kidney: Length: 11.3 cm. Echogenicity within normal limits. No mass or hydronephrosis visualized. Left Kidney: Length: 11.4 cm. Echogenicity within normal limits. No mass or hydronephrosis visualized. Bladder: Appears normal for degree of bladder distention. IMPRESSION: Normal exam. Electronically Signed   By: Signa Kell M.D.   On: 11/21/2015 19:22    ASSESSMENT AND PLAN:      1. Syncope and collapse - Unknown etiology. Differential include EtOH, arrhthymias, GI or dehydration. No driving for 6 months. ? Monitor at discharge.   2. New onset Afib - Rate controlled. Continue BB. TSH normal. Echo showed  LV EF of 40-45%, severe hypokinesis of the entireinferolateral myocardium. There is akinesis of the basalinferior myocardium, mild AR and mildly dilated LA.  - CHADSVASCs score of 4 (age, HTN, new cardiomyopathy and vascular diseases). Seems not a good candidate for anticoagualtion due to ongoing severe alcohol abuse and needs GI clearance prior to initiation.   3. PAD - Severe LE arterial atherosclerosis with intermittent claudication per note. The patient is very active and complains of leg pain . Requested records from [name]., MD.   4. Acute mild systolic CHF - Echo as above. CXR showed mild cardiomegaly. Appears euvolemic. Consider adding ACE/ARB. Will discuss further evaluation with MD. No  anginal pain.  EKG with TWI in lateral leads. ? Alcohol induced cardiomyopathy vs ischemic given WM abnormality (suspects this ). Likely cath.   4. Hepatic cirrhosis (HCC)/ Hematemesis/ Positive stool guaiac  - Per GI. Hgb normal.   5. HTN - Initially elevated, now stable. Continue BB and amlodipine.   6. AKI (acute kidney injury) (HCC) - Resolved with hydration  7. Tobacco use disorder and alcohol abuse - Advised cessation. Education given.    SignedManson Passey, PA 11/23/2015, 11:01 AM Pager [phone]  Co-Sign MD  Agree with note by Chelsea Aus PA-C  Pt admitted with syncope. Other H/O remarkable for PAD followed in [place] ([facility]), tob abuse, ETOH abuse with cirrhosis. No prior H/O CAD, MI, CVA. His 2D shows mod LV dysfunction with segmental WMA, carotid  dopplers nl. EKG shows septal Qs, Afib with CVR and lat TWI. I do not think he is a candidate for OAC. ? Etiol of syncope. Possible ETOH related, arrhythmogenic especially with new onset afib. Given his WMA on 2 D and his EKG abnormalities, I favor defining his anatomy by cath tomorrow although I'm not sure how compliant he would be with DAPT and worry about bleeding with G + stools. Would also benefit from event monitor or loop recorder. May get EP input as well.    Runell Gess, M.D., FACP, [facility], [facility] [address] [place], [place]  [address]  [phone] 11/23/2015 2:02 PM

## 2015-11-24 NOTE — Progress Notes (Signed)
Subjective:  No chest pain-"Im going home tonight"  Objective:  Vital Signs in the last 24 hours: Temp:  [98.5 F (36.9 C)-98.7 F (37.1 C)] 98.5 F (36.9 C) (07/07 1001) Pulse Rate:  [0-82] 76 (07/07 1110) Resp:  [13-22] 14 (07/07 0942) BP: (133-182)/(76-105) 166/83 mmHg (07/07 1110) SpO2:  [99 %-100 %] 100 % (07/07 1110) Weight:  [129 lb 4.8 oz (58.65 kg)] 129 lb 4.8 oz (58.65 kg) (07/07 0428)  Intake/Output from previous day:  Intake/Output Summary (Last 24 hours) at 11/24/15 1115 Last data filed at 11/24/15 0800  Gross per 24 hour  Intake    480 ml  Output    450 ml  Net     30 ml    Physical Exam: General appearance: alert, cooperative, no distress and poor dentition Neck: no JVD Lungs: clear to auscultation bilaterally Heart: regular rate and rhythm Skin: Skin color, texture, turgor normal. No rashes or lesions Neurologic: Grossly normal   Rate: 72  Rhythm: normal sinus rhythm and premature ventricular contractions (PVC)  Lab Results:  Recent Labs  11/21/15 1422  11/22/15 0537  11/23/15 1931 11/24/15 0706  WBC 13.1*  --  10.4  --   --   --   HGB 15.8  < > 14.5  < > 13.9 13.1  PLT 214  --  196  --   --   --   < > = values in this interval not displayed.  Recent Labs  11/22/15 0537 11/23/15 1600  NA 139 137  K 4.6 4.1  CL 105 106  CO2 26 25  GLUCOSE 84 109*  BUN 9 <5*  CREATININE 1.21 0.95    Recent Labs  11/21/15 2331 11/22/15 0537  TROPONINI 0.03* 0.04*    Recent Labs  11/23/15 1029  INR 1.00    Scheduled Meds: . amLODipine  10 mg Oral Daily  . atorvastatin  80 mg Oral q1800  . feeding supplement  1 Container Oral TID BM  . folic acid  1 mg Oral Daily  . heparin  5,000 Units Subcutaneous Q8H  . metoprolol tartrate  25 mg Oral BID  . multivitamin with minerals  1 tablet Oral Daily  . pantoprazole  40 mg Oral BID  . sodium chloride flush  3 mL Intravenous Q12H  . thiamine  100 mg Oral Daily   Or  . thiamine  100 mg  Intravenous Daily   Continuous Infusions: . sodium chloride 3 mL/kg/hr (11/24/15 1000)   PRN Meds:.sodium chloride, acetaminophen, LORazepam **OR** LORazepam, ondansetron (ZOFRAN) IV, oxyCODONE-acetaminophen, sodium chloride flush   Imaging: Imaging results have been reviewed  Cardiac Studies: Echo 11/22/15 Study Conclusions  - Left ventricle: The cavity size was normal. Systolic function was  mildly to moderately reduced. The estimated ejection fraction was  in the range of 40% to 45%. There is severe hypokinesis of the  entireinferolateral myocardium. There is akinesis of the  basalinferior myocardium. The study was not technically  sufficient to allow evaluation of LV diastolic dysfunction due to  atrial fibrillation. - Aortic valve: Moderate diffuse thickening and calcification.  There was mild regurgitation. - Mitral valve: Calcified annulus. There was mild regurgitation. - Left atrium: The atrium was mildly dilated.  Cath 11/24/15- 1. Ost 1st Diag to 1st Diag lesion, 50% stenosed. 2. Ost LAD to Prox LAD lesion, 80% stenosed. 3. Mid LAD lesion, 60% stenosed. 4. Prox Cx to Dist Cx lesion, 25% stenosed.   Ostial to proximal calcified eccentric 75-80% LAD  stenosis. LAD is heard large and wraps around the left ventricular apex.  Circumflex and right coronary nonobstructive minimal atherosclerosis.  Inferobasal akinesis. Ejection fraction 45-50%. Normal left ventricular filling pressures.    Assessment/Plan:  70 y.o.AA male with a history of HTN, self reported cirrhosis, daily alcohol use, tobacco abue and PAD who brought by EMS 11/21/15 after syncope episode x 2. EKG on admission showed AF with CVR, he has since converted to NSR. There apparently was a history of hematemasis but his Hgb has been stable. We are awaiting prior GI work up records, no further GI evaluation planned at this time. Cath done this am showed Ca++ 80% proximal LM.    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 [name] PA-C 11/24/2015, 11:15 AM [phone]  Agree with note written by [name] [facility]  Results of cath noted. High grade (80+ %) ostial/prox LAD . Other vessels OK. Mild LV dysf. No CP but had PAF, converted to NSR. Location and severity of LAD Dz requires revasc. Best option is off pump [place]. Will ask TCTS to see. Other issues are continued ETOH abuse with cirrhosis, H/O GIB in past which makes DAPT problematic, and tobacco abuse. Discussed all options with family and patient. I question use of DAPT in pt with GIB in addition to ? Compliance issues although Daughter says that she manages her parents medical care, drugs etc.   Derrick Ramos 11/24/2015 11:45 AM

## 2015-11-24 NOTE — Interval H&P Note (Signed)
Cath Lab Visit (complete for each Cath Lab visit)  Clinical Evaluation Leading to the Procedure:   ACS: No.  Non-ACS:    Anginal Classification: No Symptoms  Anti-ischemic medical therapy: No Therapy  Non-Invasive Test Results: No non-invasive testing performed  Prior CABG: No previous CABG      History and Physical Interval Note:  11/24/2015 8:55 AM  [name]  has presented today for surgery, with the diagnosis of CAD  The various methods of treatment have been discussed with the patient and family. After consideration of risks, benefits and other options for treatment, the patient has consented to  Procedure(s): Left Heart Cath and Coronary Angiography (N/A) as a surgical intervention .  The patient's history has been reviewed, patient examined, no change in status, stable for surgery.  I have reviewed the patient's chart and labs.  Questions were answered to the patient's satisfaction.    He would not be an immediate candidate for PCI if has moderate to severe but stable CAD. Would need to complete her GI workup to determine if he could tolerate dual antiplatelet therapy/anticoagulation (A. fib). The procedure today is considered to be diagnostic in attempt to exclude severe multivessel CAD/ischemia as a cause of the patient's syncope.   Lyn Records III

## 2015-11-24 NOTE — Progress Notes (Signed)
Came to ambulate however PT getting pt up. Will f/u later. He needs [facility] booklet (they are locked up right now). Ethelda Chick CES, ACSM 2:29 PM 11/24/2015

## 2015-11-25 ENCOUNTER — Observation Stay (HOSPITAL_BASED_OUTPATIENT_CLINIC_OR_DEPARTMENT_OTHER): Payer: Medicare Other

## 2015-11-25 DIAGNOSIS — I25110 Atherosclerotic heart disease of native coronary artery with unstable angina pectoris: Secondary | ICD-10-CM | POA: Diagnosis not present

## 2015-11-25 DIAGNOSIS — I2510 Atherosclerotic heart disease of native coronary artery without angina pectoris: Secondary | ICD-10-CM | POA: Diagnosis not present

## 2015-11-25 LAB — BLOOD GAS, ARTERIAL
Acid-Base Excess: 0.1 mmol/L (ref 0.0–2.0)
Bicarbonate: 23.6 mEq/L (ref 20.0–24.0)
Drawn by: 105521
FIO2: 0.21
O2 Saturation: 95.7 %
Patient temperature: 98.6
TCO2: 24.7 mmol/L (ref 0–100)
pCO2 arterial: 34.5 mmHg — ABNORMAL LOW (ref 35.0–45.0)
pH, Arterial: 7.449 (ref 7.350–7.450)
pO2, Arterial: 78.7 mmHg — ABNORMAL LOW (ref 80.0–100.0)

## 2015-11-25 LAB — HEMOGLOBIN AND HEMATOCRIT, BLOOD
HEMATOCRIT: 38.2 % — AB (ref 39.0–52.0)
HEMOGLOBIN: 12.6 g/dL — AB (ref 13.0–17.0)

## 2015-11-25 LAB — DIC (DISSEMINATED INTRAVASCULAR COAGULATION)PANEL
D-Dimer, Quant: 1.43 ug/mL-FEU — ABNORMAL HIGH (ref 0.00–0.50)
Fibrinogen: 500 mg/dL — ABNORMAL HIGH (ref 204–475)
INR: 1 (ref 0.00–1.49)
Platelets: 160 10*3/uL (ref 150–400)
Prothrombin Time: 13.4 seconds (ref 11.6–15.2)
Smear Review: NONE SEEN
aPTT: 35 seconds (ref 24–37)

## 2015-11-25 LAB — COMPREHENSIVE METABOLIC PANEL
ALT: 13 U/L — ABNORMAL LOW (ref 17–63)
AST: 17 U/L (ref 15–41)
Albumin: 2.6 g/dL — ABNORMAL LOW (ref 3.5–5.0)
Alkaline Phosphatase: 71 U/L (ref 38–126)
Anion gap: 6 (ref 5–15)
BUN: <5 mg/dL — ABNORMAL LOW (ref 6–20)
CO2: 26 mmol/L (ref 22–32)
Calcium: 8 mg/dL — ABNORMAL LOW (ref 8.9–10.3)
Chloride: 107 mmol/L (ref 101–111)
Creatinine, Ser: 0.95 mg/dL (ref 0.61–1.24)
GFR calc Af Amer: >60 mL/min (ref >60)
GFR calc non Af Amer: >60 mL/min (ref >60)
Glucose, Bld: 97 mg/dL (ref 65–99)
Potassium: 3.4 mmol/L — ABNORMAL LOW (ref 3.5–5.1)
Sodium: 139 mmol/L (ref 135–145)
Total Bilirubin: 0.8 mg/dL (ref 0.3–1.2)
Total Protein: 5.5 g/dL — ABNORMAL LOW (ref 6.5–8.1)

## 2015-11-25 LAB — CBC
HCT: 38.8 % — ABNORMAL LOW (ref 39.0–52.0)
Hemoglobin: 12.7 g/dL — ABNORMAL LOW (ref 13.0–17.0)
MCH: 31.7 pg (ref 26.0–34.0)
MCHC: 32.7 g/dL (ref 30.0–36.0)
MCV: 96.8 fL (ref 78.0–100.0)
Platelets: 166 10*3/uL (ref 150–400)
RBC: 4.01 MIL/uL — ABNORMAL LOW (ref 4.22–5.81)
RDW: 12.9 % (ref 11.5–15.5)
WBC: 7.4 10*3/uL (ref 4.0–10.5)

## 2015-11-25 LAB — HEPATITIS PANEL, ACUTE
HCV Ab: <0.1 s/co ratio (ref 0.0–0.9)
Hep A IgM: NEGATIVE
Hep B C IgM: NEGATIVE
Hepatitis B Surface Ag: NEGATIVE

## 2015-11-25 LAB — AMMONIA: Ammonia: 17 umol/L (ref 9–35)

## 2015-11-25 LAB — ANTITHROMBIN III: AntiThromb III Func: 89 % (ref 75–120)

## 2015-11-25 LAB — TSH: TSH: 1.401 u[IU]/mL (ref 0.350–4.500)

## 2015-11-25 MED ORDER — ASPIRIN EC 81 MG PO TBEC
81.0000 mg | DELAYED_RELEASE_TABLET | Freq: Every day | ORAL | Status: DC
  Administered 2015-11-25 – 2015-12-01 (×7): 81 mg via ORAL
  Filled 2015-11-25 (×7): qty 1

## 2015-11-25 MED ORDER — ALBUTEROL SULFATE (2.5 MG/3ML) 0.083% IN NEBU: 2.5000 mg | INHALATION_SOLUTION | RESPIRATORY_TRACT | Status: DC | PRN

## 2015-11-25 MED ORDER — POTASSIUM CHLORIDE CRYS ER 20 MEQ PO TBCR
40.0000 meq | EXTENDED_RELEASE_TABLET | Freq: Once | ORAL | Status: AC
  Administered 2015-11-25: 40 meq via ORAL
  Filled 2015-11-25: qty 2

## 2015-11-25 NOTE — Progress Notes (Signed)
Pre-op Cardiac Surgery  Carotid Findings:  1-39% ICA stenosis.  Vertebral artery flow is antegrade.  Upper Extremity Right Left  Brachial Pressures 158T 160T  Radial Waveforms T T  Ulnar Waveforms T T  Palmar Arch (Allen's Test) WNL WNL   Findings:      Lower  Extremity Right Left  Dorsalis Pedis    Anterior Tibial 104M 31M  Posterior Tibial 116M 78M  Ankle/Brachial Indices 0.73 0.56    Findings:   ABIs indicate a moderate reduction in arterial blood flow to the lower extremities with abnormal waveforms.

## 2015-11-25 NOTE — Progress Notes (Signed)
Progress Note    Derrick Ramos  WJX:[id number] DOB: May 20, 1946  DOA: 11/21/2015 PCP: Pcp Not In System    Brief Narrative:   [name] is an 70 y.o. male gentleman with a history of cirrhosis (self-reported), daily EtOH use, active tobacco use, PVD, and HTN who presents to the ED accompanied by his wife and daughter who has had recurrent syncopal episodes. Cardiology consulted and he underwent cardiac cath on 7/7 showing LAD disease. CVT surgery consulted and recommendations given.   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 Coffee ground emesis.  Hemoglobin stable.  GI consulted for further eval, obtained old records from one year ago. He underwent EGD showing an antral ulcer and antral erosions. Colonoscopy shows tubular adenoma in the rectum and descending colon which were removed. Recommended to continue with BID PPI.  Discussed with Dr Matthias Hughs today and recommended repeat EGD possibly Monday, but patient is reluctant to undergo the procedure. Will discuss with the patient again.  Hemoglobin stable around 13.  Pt is cleared from GI perspective to be on dual anti platelet therapy.     New onset / Paroxysmal atrial fibrillation: Echo shows Systolic function was mildly to moderately reduced. The estimated ejection fraction was  in the range of 40% to 45%. There is severe hypokinesis of the entire inferolateral myocardium. There is akinesis of the  basalinferior myocardium. The study was not technically sufficient to allow evaluation of LV diastolic  dysfunction due to  atrial fibrillation. [place] score is 3.  But because of his ETOH abuse, not a good candidate for anticoagulation.  Cardiology consulted, underwent cath shows 80% LAD stenosis, circumflex and irght coronary are non obstructive.  Recommended CVTS to see .  Appreciate EP recommendations.  Discussed with the patient.     Aki: Improved with gentle hydration. Stopped IV fluids.  Recheck renal parameters are within normal limits.    Hypertension: well controlled.     Liver Cirrhosis: Liver function panel wnl.    etoh abuse: On CIWA protocol.  No signs of withdrawal.   Left rib pain: Rib series ordered and negative for fracture.  Pain control .  Avoid NSAIDS.   Abnormal UA:  Urine cultures not sent on admission. He is asymptomatic. Denies any dysuria.  Continue to monitor.   Fall and scalp wound: No bleeding.     Family Communication/Anticipated D/C date and plan/Code Status   DVT prophylaxis: Lovenox ordered. Code Status: Full Code.  Family Communication: none at bedside.  Disposition Plan: pending further investigation. Pending PT evaluation.   Medical Consultants:    Gastroenterology.   Cardiology  EP  TCTS.   Procedures:  echocardiogram Cardiac catheterization.  Anti-Infectives:   Anti-infectives    Start     Dose/Rate Route Frequency Ordered Stop   11/24/15 1400  ciprofloxacin (CIPRO) IVPB 400 mg     400 mg 200 mL/hr over 60 Minutes Intravenous Every 12 hours 11/24/15 1330        Subjective:    Derrick Ramos  denies any chest pain or  sob. Denies any dysuria. Wants to go home.   Objective:    Filed Vitals:   11/24/15 2252 11/25/15 0445 11/25/15 0929 11/25/15 1302  BP:  150/77  148/75  Pulse: 78 79  78  Temp:  98.9 F (37.2 C)  98.9 F (37.2 C)  TempSrc:  Oral  Oral  Resp: 17 20  16   Height:      Weight:  60.056 kg (132 lb 6.4 oz)    SpO2: 99% 100% 97% 99%    Intake/Output Summary (Last 24 hours) at 11/25/15  1434 Last data filed at 11/25/15 1303  Gross per 24 hour  Intake    700 ml  Output    525 ml  Net    175 ml   Filed Weights   11/23/15 0514 11/24/15 0428 11/25/15 0445  Weight: 58.741 kg (129 lb 8 oz) 58.65 kg (129 lb 4.8 oz) 60.056 kg (132 lb 6.4 oz)    Exam: General exam: Appears calm and comfortable.  Respiratory system: Clear to auscultation. Respiratory effort normal. Cardiovascular system: S1 & S2 heard, RRR. No JVD,  rubs, gallops or clicks. No murmurs. Gastrointestinal system:tender abdomen, soft, non distended. Pain in the left rib area Central nervous system: Alert and oriented. No focal neurological deficits. Extremities: No clubbing, edema, or cyanosis. Skin: No rashes, lesions or ulcers .   Data Reviewed:   I have personally reviewed following labs and imaging studies:  Labs: Basic Metabolic Panel:  Recent Labs Lab 11/21/15 1422 11/22/15 0537 11/23/15 1600 11/24/15 1113 11/25/15 0709  NA 136 139 137  --  139  K 3.3* 4.6 4.1  --  3.4*  CL 100* 105 106  --  107  CO2 24 26 25   --  26  GLUCOSE 122* 84 109*  --  97  BUN 9 9 <5*  --  <5*  CREATININE 2.28* 1.21 0.95 0.99 0.95  CALCIUM 9.2 8.9 8.7*  --  8.0*   GFR Estimated Creatinine Clearance: 60.6 mL/min (by C-G formula based on Cr of 0.95). Liver Function Tests:  Recent Labs Lab 11/21/15 1818 11/22/15 0537 11/25/15 0709  AST 41 31 17  ALT 26 23 13*  ALKPHOS 100 95 71  BILITOT 1.6* 1.7* 0.8  PROT 7.1 6.5 5.5*  ALBUMIN 3.4* 3.0* 2.6*   No results for input(s): LIPASE, AMYLASE in the last 168 hours.  Recent Labs Lab 11/25/15 0709  AMMONIA 17   Coagulation profile  Recent Labs Lab 11/21/15 1831 11/23/15 1029 11/25/15 0709  INR 1.15 1.00 1.00    CBC:  Recent Labs Lab 11/21/15 1422  11/22/15 0537  11/23/15 1931 11/24/15 0706 11/24/15 1113 11/24/15 1908 11/25/15 0709  WBC 13.1*  --  10.4  --   --   --  7.9  --  7.4  HGB 15.8  < > 14.5  < > 13.9 13.1 13.4 13.5 12.7*  HCT 46.2   < > 43.4  < > 41.0 39.1 40.1 40.7 38.8*  MCV 97.7  --  97.3  --   --   --  97.1  --  96.8  PLT 214  --  196  --   --   --  161  --  160  166  < > = values in this interval not displayed. Cardiac Enzymes:  Recent Labs Lab 11/21/15 1818 11/21/15 2331 11/22/15 0537  TROPONINI 0.03* 0.03* 0.04*   BNP (last 3 results) No results for input(s): PROBNP in the last 8760 hours. CBG: No  results for input(s): GLUCAP in the last 168 hours. D-Dimer:  Recent Labs  11/25/15 0709  DDIMER 1.43*   Hgb A1c: No results for input(s): HGBA1C in the last 72 hours. Lipid Profile: No results for input(s): CHOL, HDL, LDLCALC, TRIG, CHOLHDL, LDLDIRECT in the last 72 hours. Thyroid function studies:  Recent Labs  11/25/15 0709  TSH 1.401   Anemia work up: No results for input(s): VITAMINB12, FOLATE, FERRITIN, TIBC, IRON, RETICCTPCT in the last 72 hours. Sepsis Labs:  Recent Labs Lab 11/21/15 1422 11/22/15 0537 11/24/15 1113 11/25/15 0709  WBC 13.1* 10.4 7.9 7.4   Urine analysis:    Component Value Date/Time   COLORURINE ORANGE* 11/21/2015 2148   APPEARANCEUR TURBID* 11/21/2015 2148   LABSPEC 1.026 11/21/2015 2148   PHURINE 5.0 11/21/2015 2148   GLUCOSEU 100* 11/21/2015 2148   HGBUR NEGATIVE 11/21/2015 2148   BILIRUBINUR MODERATE* 11/21/2015 2148   KETONESUR 15* 11/21/2015 2148   PROTEINUR 100* 11/21/2015 2148   NITRITE POSITIVE* 11/21/2015 2148   LEUKOCYTESUR SMALL* 11/21/2015 2148   Microbiology No results found for this or any previous visit (from the past 240 hour(s)).  Radiology: Ct Chest Wo Contrast  11/24/2015  CLINICAL DATA:  CHF EXAM: CT CHEST WITHOUT CONTRAST TECHNIQUE: Multidetector CT imaging of the chest was performed following the standard protocol without IV contrast. COMPARISON:  Chest radiographs dated 11/21/2015 FINDINGS: Cardiovascular: The heart is normal in size. No pericardial effusion. Three vessel coronary atherosclerosis. Atherosclerotic calcifications  of the aortic arch. Mediastinum/Nodes: No suspicious mediastinal lymphadenopathy. No evidence of axillary lymphadenopathy. Visualized thyroid is unremarkable. Lungs/Pleura: Small bilateral pleural effusions, left greater than right. Associated mild patchy bilateral lower lobe opacities, likely atelectasis. No frank interstitial edema. No suspicious pulmonary nodules. Pleural-based calcifications along the right hemithorax. Mild pleural-based nodularity along the fissures bilaterally. Mild emphysematous changes No pneumothorax. Upper Abdomen: Visualized upper abdomen is notable for contrast within the bilateral renal collecting systems, likely related to recent cardiac catheterization. Musculoskeletal: Degenerative changes of the visualized thoracolumbar spine. IMPRESSION: Small bilateral pleural effusions, left greater than right. Associated mild patchy bilateral lower lobe opacities, likely atelectasis. No frank interstitial edema. Electronically Signed   By: Charline Bills M.D.   On: 11/24/2015 21:29    Medications:   . amLODipine  10 mg Oral Daily  . aspirin EC  81 mg Oral Daily  . atorvastatin  80 mg Oral q1800  . ciprofloxacin  400 mg Intravenous Q12H  . feeding supplement  1 Container Oral TID BM  . folic acid  1 mg Oral Daily  . heparin  5,000 Units Subcutaneous Q8H  . metoprolol tartrate  25 mg Oral BID  . mometasone-formoterol  2 puff Inhalation BID  . multivitamin with minerals  1 tablet Oral Daily  . pantoprazole  40 mg Oral BID  . sodium chloride flush  3 mL Intravenous Q12H  . thiamine  100 mg Oral Daily   Continuous Infusions:    Time spent: 30 minutes     Aujanae Mccullum  [facility] [phone].  *Please refer to [email or web address], password [email or web address] to get updated schedule on who will round on this patient, as hospitalists switch teams weekly. If 7PM-7AM, please contact night-coverage at [email or web address], password [email or web address] for any overnight needs.  11/25/2015, 2:34 PM

## 2015-11-25 NOTE — Progress Notes (Signed)
CARDIAC REHAB PHASE I   PRE:  Rate/Rhythm: 89 SR  BP:  Supine: 132/80  Sitting:   Standing:    SaO2: 99 RA  MODE:  Ambulation: 550 ft   POST:  Rate/Rhythm: 80  BP:  Supine:   Sitting: 120/70  Standing:    SaO2: 99 RA 1350-1425 Assisted X 1 and used walker to ambulate. Gait steady with walker. Pt able to walk 550 feet without c/o. VS stable Pt did say that he was tired from his test today so placed back to bed. We will continue to follow pt.  Melina Copa RN 11/25/2015 2:45 PM

## 2015-11-25 NOTE — Progress Notes (Addendum)
SUBJECTIVE:  No complaints  OBJECTIVE:   Vitals:   Filed Vitals:   11/24/15 1347 11/24/15 2022 11/24/15 2252 11/25/15 0445  BP: 113/69 118/70  150/77  Pulse: 73 82 78 79  Temp:  98.7 F (37.1 C)  98.9 F (37.2 C)  TempSrc:  Oral  Oral  Resp:  18 17 20   Height:      Weight:    132 lb 6.4 oz (60.056 kg)  SpO2: 100% 100% 99% 100%   I&O's:   Intake/Output Summary (Last 24 hours) at 11/25/15 0913 Last data filed at 11/25/15 0840  Gross per 24 hour  Intake    480 ml  Output    525 ml  Net    -45 ml   TELEMETRY: Reviewed telemetry pt in NSR with 6 beat run of nonsustained ventricular tachycardia     PHYSICAL EXAM General: Well developed, well nourished, in no acute distress Head: Eyes PERRLA, No xanthomas.   Normal cephalic and atramatic  Lungs:   Clear bilaterally to auscultation and percussion. Heart:   HRRR S1 S2 Pulses are 2+ & equal. Abdomen: Bowel sounds are positive, abdomen soft and non-tender without masses Msk:  Back normal, normal gait. Normal strength and tone for age. Extremities:   No clubbing, cyanosis or edema.  DP +1 Neuro: Alert and oriented X 3. Psych:  Good affect, responds appropriately   LABS: Basic Metabolic Panel:  Recent Labs  16/10/96 1600 11/24/15 1113 11/25/15 0709  NA 137  --  139  K 4.1  --  3.4*  CL 106  --  107  CO2 25  --  26  GLUCOSE 109*  --  97  BUN <5*  --  <5*  CREATININE 0.95 0.99 0.95  CALCIUM 8.7*  --  8.0*   Liver Function Tests:  Recent Labs  11/25/15 0709  AST 17  ALT 13*  ALKPHOS 71  BILITOT 0.8  PROT 5.5*  ALBUMIN 2.6*   No results for input(s): LIPASE, AMYLASE in the last 72 hours. CBC:  Recent Labs  11/24/15 1113 11/24/15 1908 11/25/15 0709  WBC 7.9  --  7.4  HGB 13.4 13.5 12.7*  HCT 40.1 40.7 38.8*  MCV 97.1  --  96.8  PLT 161  --  160  166   Cardiac Enzymes: No results for input(s): CKTOTAL, CKMB, CKMBINDEX, TROPONINI in the last 72 hours. BNP: Invalid input(s):  POCBNP D-Dimer:  Recent Labs  11/25/15 0709  DDIMER 1.43*   Hemoglobin A1C: No results for input(s): HGBA1C in the last 72 hours. Fasting Lipid Panel: No results for input(s): CHOL, HDL, LDLCALC, TRIG, CHOLHDL, LDLDIRECT in the last 72 hours. Thyroid Function Tests:  Recent Labs  11/25/15 0709  TSH 1.401   Anemia Panel: No results for input(s): VITAMINB12, FOLATE, FERRITIN, TIBC, IRON, RETICCTPCT in the last 72 hours. Coag Panel:   Lab Results  Component Value Date   INR 1.00 11/25/2015   INR 1.00 11/23/2015   INR 1.15 11/21/2015    RADIOLOGY: Dg Chest 2 View  11/21/2015  CLINICAL DATA:  Multiple syncopal episodes. Patient fell, striking head. Initial encounter. EXAM: CHEST  2 VIEW COMPARISON:  None. FINDINGS: 1513 hours. The heart is mildly enlarged. There is aortic tortuosity. The lungs are mildly hyperinflated but clear. There is some calcification along the right hemidiaphragm. No pleural effusion or pneumothorax is seen. Mild degenerative changes are present throughout the spine. IMPRESSION: No active cardiopulmonary process or acute posttraumatic findings demonstrated. Mild cardiomegaly. Electronically Signed  By: Carey Bullocks M.D.   On: 11/21/2015 15:12   Dg Ribs Unilateral Left  11/22/2015  CLINICAL DATA:  Fall on left side 2 days ago, left lower rib pain EXAM: LEFT RIBS - 2 VIEW COMPARISON:  11/21/2015 FINDINGS: Two views left ribs submitted. No infiltrate or pulmonary edema. No rib fracture is identified. There is no pneumothorax. IMPRESSION: Negative. Electronically Signed   By: Natasha Mead M.D.   On: 11/22/2015 12:38   Ct Head Wo Contrast  11/21/2015  CLINICAL DATA:  Fall, neck pain EXAM: CT HEAD WITHOUT CONTRAST CT CERVICAL SPINE WITHOUT CONTRAST TECHNIQUE: Multidetector CT imaging of the head and cervical spine was performed following the standard protocol without intravenous contrast. Multiplanar CT image reconstructions of the cervical spine were also generated.  COMPARISON:  None. FINDINGS: CT HEAD FINDINGS No skull fracture is noted. Paranasal sinuses and mastoid air cells are unremarkable. Atherosclerotic calcifications of carotid siphon are noted. No intracranial hemorrhage, mass effect or midline shift. Moderate cerebral atrophy. Mild periventricular and patchy subcortical white matter decreased attenuation probable due to chronic small vessel ischemic changes. No acute cortical infarction. No mass lesion is noted on this unenhanced scan. CT CERVICAL SPINE FINDINGS Axial images of the cervical spine shows no acute fracture or subluxation. Atherosclerotic calcifications of vertebral arteries are noted. Computer processed images shows no acute fracture or subluxation. Mild degenerative changes C1-C2 articulation. Mild posterior disc bulge at C3-C4 level. Mild anterior spurring lower endplate of C4 and C5 vertebral body. Mild disc space flattening with minimal posterior disc bulge as at C6-C7 level. No prevertebral soft tissue swelling. Cervical airway is patent. There is no pneumothorax in visualized lung apices. IMPRESSION: 1. No acute intracranial abnormality. There is moderate cerebral atrophy. Mild periventricular and patchy subcortical white matter decreased attenuation probable due to chronic small vessel ischemic changes. 2. No cervical spine acute fracture or subluxation. Mild degenerative changes as described above. Electronically Signed   By: Natasha Mead M.D.   On: 11/21/2015 15:09   Ct Chest Wo Contrast  11/24/2015  CLINICAL DATA:  CHF EXAM: CT CHEST WITHOUT CONTRAST TECHNIQUE: Multidetector CT imaging of the chest was performed following the standard protocol without IV contrast. COMPARISON:  Chest radiographs dated 11/21/2015 FINDINGS: Cardiovascular: The heart is normal in size. No pericardial effusion. Three vessel coronary atherosclerosis. Atherosclerotic calcifications of the aortic arch. Mediastinum/Nodes: No suspicious mediastinal lymphadenopathy. No  evidence of axillary lymphadenopathy. Visualized thyroid is unremarkable. Lungs/Pleura: Small bilateral pleural effusions, left greater than right. Associated mild patchy bilateral lower lobe opacities, likely atelectasis. No frank interstitial edema. No suspicious pulmonary nodules. Pleural-based calcifications along the right hemithorax. Mild pleural-based nodularity along the fissures bilaterally. Mild emphysematous changes No pneumothorax. Upper Abdomen: Visualized upper abdomen is notable for contrast within the bilateral renal collecting systems, likely related to recent cardiac catheterization. Musculoskeletal: Degenerative changes of the visualized thoracolumbar spine. IMPRESSION: Small bilateral pleural effusions, left greater than right. Associated mild patchy bilateral lower lobe opacities, likely atelectasis. No frank interstitial edema. Electronically Signed   By: Charline Bills M.D.   On: 11/24/2015 21:29   Ct Cervical Spine Wo Contrast  11/21/2015  CLINICAL DATA:  Fall, neck pain EXAM: CT HEAD WITHOUT CONTRAST CT CERVICAL SPINE WITHOUT CONTRAST TECHNIQUE: Multidetector CT imaging of the head and cervical spine was performed following the standard protocol without intravenous contrast. Multiplanar CT image reconstructions of the cervical spine were also generated. COMPARISON:  None. FINDINGS: CT HEAD FINDINGS No skull fracture is noted. Paranasal sinuses and mastoid  air cells are unremarkable. Atherosclerotic calcifications of carotid siphon are noted. No intracranial hemorrhage, mass effect or midline shift. Moderate cerebral atrophy. Mild periventricular and patchy subcortical white matter decreased attenuation probable due to chronic small vessel ischemic changes. No acute cortical infarction. No mass lesion is noted on this unenhanced scan. CT CERVICAL SPINE FINDINGS Axial images of the cervical spine shows no acute fracture or subluxation. Atherosclerotic calcifications of vertebral arteries  are noted. Computer processed images shows no acute fracture or subluxation. Mild degenerative changes C1-C2 articulation. Mild posterior disc bulge at C3-C4 level. Mild anterior spurring lower endplate of C4 and C5 vertebral body. Mild disc space flattening with minimal posterior disc bulge as at C6-C7 level. No prevertebral soft tissue swelling. Cervical airway is patent. There is no pneumothorax in visualized lung apices. IMPRESSION: 1. No acute intracranial abnormality. There is moderate cerebral atrophy. Mild periventricular and patchy subcortical white matter decreased attenuation probable due to chronic small vessel ischemic changes. 2. No cervical spine acute fracture or subluxation. Mild degenerative changes as described above. Electronically Signed   By: Natasha Mead M.D.   On: 11/21/2015 15:09   US Renal  11/21/2015  CLINICAL DATA:  Acute kidney injury. EXAM: RENAL / URINARY TRACT ULTRASOUND COMPLETE COMPARISON:  None. FINDINGS: Right Kidney: Length: 11.3 cm. Echogenicity within normal limits. No mass or hydronephrosis visualized. Left Kidney: Length: 11.4 cm. Echogenicity within normal limits. No mass or hydronephrosis visualized. Bladder: Appears normal for degree of bladder distention. IMPRESSION: Normal exam. Electronically Signed   By: Signa Kell M.D.   On: 11/21/2015 19:22    Assessment/Plan:  70 y.o.AA male with a history of HTN, self reported cirrhosis, daily alcohol use, tobacco abuse and PAD who was brought by EMS 11/21/15 after syncope episode x 2. EKG on admission showed AF with CVR, he has since converted to NSR. There apparently was a history of hematemasis but his Hgb has been stable. We are awaiting prior GI work up records, no further GI evaluation planned at this time. Cath done this admission showed Ca++ 80% proximal LM.    Principal Problem:  Syncope and collapse Active Problems:  Atrial fibrillation (HCC)  CAD- 80% Ca++ proximal LAD  HTN (hypertension)   Hematemesis  AKI (acute kidney injury) (HCC)  Alcohol abuse  Atherosclerotic PVD with intermittent claudication (HCC)  Hypokalemia  Hepatic cirrhosis (HCC)  Laceration of head  Tobacco use disorder  Malnutrition of moderate degree   Nonsustained ventricular tachycardia   PLAN: Appreciated CVTS input.  Dr. Donata Clay concerned about risk of GI bleeding with high dose heparin needed for surgery.  Feels patient would benefit from LIMA to LAD but overall comorbidities may prohibit him from being a candidate for CABG.  Will await further recs from CVTS.  Continue statin/BB.  GI stated in noet yesterday that is was ok to resume ASA/Plavix.  Will hold on plavix until determined whether he is a candidate for CABG.  Start ASA 81mg  daily. Continue BB for nonsustained ventricular tachycardia.    Armanda Magic, MD  11/25/2015  9:13 AM

## 2015-11-25 NOTE — Progress Notes (Signed)
Patient seen at request of Dr. Blake Divine, for updated consideration of endoscopic evaluation in the setting of new onset atrial fibrillation and documented significant coronary disease.Marland Kitchen  He presented with syncope, coffee ground emesis, and heme positive stool in the setting of some possible liver disease. In the meantime, records pertaining to his endoscopy and colonoscopy in May, 2016 in [place], [place].  The endoscopy showed a small 7 mm benign-appearing gastric ulcer (biopsies negative, no Helicobacter pylori). There was no evidence of esophageal varices nor mention of any portal gastropathy. His colonoscopy showed some small adenomatous polyps.  The patient has not had an imaging study of the liver on this admission, but his platelet count is normal, which would tend to go against advanced cirrhosis or portal hypertension.  The patient does admit to use of Advil prior to admission on an as needed basis for headaches. It does not sound as though he had been using it with any frequency recently, however.  I explained to the patient the rationale for updated endoscopic evaluation, including concerns that he showed evidence of upper tract bleeding on admission, has a prior history of ulcer disease, and may need to be on ulcerogenic and/or blood thinning medication in association with his coronary disease. I went over the alternatives to endoscopic evaluation, including observation on medical therapy, or an upper GI series, neither of which in my opinion would be as helpful.  After all this discussion, the patient indicates he does not want endoscopic evaluation, but that he might discuss it with his daughter and wife "on Monday." Subjectively, it is my opinion that he seemed to have an attitude of lack of concern concerning his medical condition and a bias against further medical evaluation, despite my efforts to be very thorough in explaining the issues involved.  I have discussed this with  Dr. Blake Divine and, by mutual agreement, we will remain on standby from the GI tract standpoint.   In other words, we will not plan to see the patient again except if so requested by his physicians, based on perhaps a change in his decision about how to proceed. We would certainly be happy to provide endoscopic evaluation, or to discuss this further with the patient, upon your request.  Florencia Reasons, M.D. Pager [phone] If no answer or after 5 PM call [phone]

## 2015-11-26 DIAGNOSIS — I25110 Atherosclerotic heart disease of native coronary artery with unstable angina pectoris: Secondary | ICD-10-CM

## 2015-11-26 LAB — SURGICAL PCR SCREEN
MRSA, PCR: NEGATIVE
Staphylococcus aureus: NEGATIVE

## 2015-11-26 LAB — POTASSIUM: POTASSIUM: 3 mmol/L — AB (ref 3.5–5.1)

## 2015-11-26 LAB — HEMOGLOBIN AND HEMATOCRIT, BLOOD
HCT: 41.4 % (ref 39.0–52.0)
HEMATOCRIT: 37.8 % — AB (ref 39.0–52.0)
HEMOGLOBIN: 14.2 g/dL (ref 13.0–17.0)
Hemoglobin: 12.5 g/dL — ABNORMAL LOW (ref 13.0–17.0)

## 2015-11-26 MED ORDER — SODIUM CHLORIDE 0.9 % IV SOLN
INTRAVENOUS | Status: DC
  Administered 2015-11-26: 20:00:00 via INTRAVENOUS

## 2015-11-26 MED ORDER — METOPROLOL TARTRATE 25 MG PO TABS
25.0000 mg | ORAL_TABLET | Freq: Once | ORAL | Status: AC
  Administered 2015-11-26: 25 mg via ORAL
  Filled 2015-11-26: qty 1

## 2015-11-26 MED ORDER — METOPROLOL TARTRATE 50 MG PO TABS
50.0000 mg | ORAL_TABLET | Freq: Two times a day (BID) | ORAL | Status: DC
  Administered 2015-11-26 – 2015-11-27 (×2): 50 mg via ORAL
  Filled 2015-11-26 (×2): qty 1

## 2015-11-26 NOTE — Progress Notes (Signed)
Progress Note    Derrick Ramos  RUE:[id number] DOB: [birth year]  DOA: 11/21/2015 PCP: Pcp Not In System    Brief Narrative:   Derrick Ramos is an 70 y.o. male gentleman with a history of cirrhosis (self-reported), daily EtOH use, active tobacco use, PVD, and HTN who presents to the ED accompanied by his wife and daughter who has had recurrent syncopal episodes. Cardiology consulted and he underwent cardiac cath on 7/7 showing LAD disease. CVT surgery consulted and recommendations given.  7/9 PLAN for EGD in am. Will call Dr Matthias Hughs to schedule it in am.   Assessment/Plan:   Principal Problem:   Syncope and collapse Active Problems:   Atrial fibrillation (HCC)   Hypokalemia   HTN (hypertension)   Hepatic cirrhosis (HCC)   Hematemesis   AKI (acute kidney injury) (HCC)   Laceration of head   Tobacco use disorder   Alcohol abuse   Malnutrition of moderate degree   CAD- 80% Ca++ proximal LAD   Atherosclerotic PVD with intermittent claudication (HCC)   Coronary artery disease involving native coronary artery of native heart without angina pectoris  Syncope possibly from ETOH abuse/ arrhythmias/ orthostatic/: ? Coffee ground emesis.  GI consulted for further eval, obtained old records from one year ago. He underwent EGD showing an antral ulcer and antral erosions. Colonoscopy shows tubular adenoma in the rectum and descending colon which were removed. Recommended to continue with BID PPI.  Discussed with Dr Matthias Hughs today and recommended repeat EGD possibly Monday, but patient is reluctant to undergo the procedure. Called daughter and discussed with patient again, will make him NPO after midnight for possible EGD.   Anemia of blood loss/ GI bleed:  Hemoglobin on admission was 15.8 and today its 12.5. Will get anemia panel, stool for occult blood positive.  Assuming he was dehydrated on admission, there is a 3 gm drop in hemoglobin,.  Continue to monitor cbc. Repeat hemoglobin in  am.  Transfuse to keep hemoglobin greater than 8.  GI consulted and recommended to get EGD done, pt agreed, scheduled for tomorrow.       New onset / Paroxysmal atrial fibrillation: Echo shows Systolic function was mildly to moderately reduced. The estimated ejection fraction was  in the range of 40% to 45%. There is severe hypokinesis of the entire inferolateral myocardium. There is akinesis of the  basalinferior myocardium. The study was not technically sufficient to allow evaluation of LV diastolic dysfunction due to  atrial fibrillation. [place] score is 3.  But because of his ETOH abuse, not a good candidate for anticoagulation.  Cardiology consulted, underwent cath shows 80% LAD stenosis, circumflex and irght coronary are non obstructive.  Recommended CVTS to see .  Appreciate EP recommendations.  Discussed with the patient.     Aki: Improved with gentle hydration. Stopped IV fluids.  Recheck renal parameters are within normal limits.    Hypertension:  Not well controlled.     Liver Cirrhosis: Liver function panel wnl.    etoh abuse: On CIWA protocol.  No signs of withdrawal.   Left rib pain: Rib series ordered and negative for fracture.  Pain control .  Avoid NSAIDS.   Abnormal UA:  Urine cultures not sent on admission. He is asymptomatic. Denies any dysuria.  Continue to monitor. IV cipro started by CVTS.   Fall and scalp wound: No bleeding.     Family Communication/Anticipated D/C date and plan/Code Status   DVT prophylaxis: Lovenox ordered. Code Status: Full Code.  Family Communication: none at bedside.  Disposition Plan: pending further investigation.   Medical Consultants:    Gastroenterology.   Cardiology  EP  TCTS.   Procedures:  echocardiogram Cardiac catheterization.  Anti-Infectives:   Anti-infectives    Start     Dose/Rate Route Frequency Ordered Stop   11/24/15 1400  ciprofloxacin (CIPRO) IVPB 400 mg     400 mg 200  mL/hr over 60 Minutes Intravenous Every 12 hours 11/24/15 1330        Subjective:    Derrick Ramos  denies any chest pain or sob. Agreed to undergo EGD.  Objective:    Filed Vitals:   11/25/15 1945 11/25/15 2115 11/26/15 0510 11/26/15 0823  BP:  157/84 176/88   Pulse:  82 81   Temp:  98.9 F (37.2 C) 98.7 F (37.1 C)   TempSrc:  Oral Oral   Resp:  17 18   Height:      Weight:   59.1 kg (130 lb 4.7 oz)   SpO2: 98% 100% 100% 99%    Intake/Output Summary (Last 24 hours) at 11/26/15 1321 Last data filed at 11/26/15 0514  Gross per 24 hour  Intake    800 ml  Output    425 ml  Net    375 ml   Filed Weights   11/24/15 0428 11/25/15 0445 11/26/15 0510  Weight: 58.65 kg (129 lb 4.8 oz) 60.056 kg (132 lb 6.4 oz) 59.1 kg (130 lb 4.7 oz)    Exam: General exam: Appears calm and comfortable.  Respiratory system: Clear to auscultation. Respiratory effort normal. Cardiovascular system: S1 & S2 heard, RRR. No JVD,  rubs, gallops or clicks. No murmurs. Gastrointestinal system:tender abdomen, soft, non distended. Pain in the left rib area Central nervous system: Alert and oriented. No focal neurological deficits. Extremities: No clubbing, edema, or cyanosis. Skin: No rashes, lesions or ulcers .   Data Reviewed:   I have personally reviewed following labs and imaging studies:  Labs: Basic Metabolic Panel:  Recent Labs Lab 11/21/15 1422 11/22/15 0537 11/23/15 1600 11/24/15 1113 11/25/15 0709  NA 136 139 137  --  139  K 3.3* 4.6 4.1  --  3.4*  CL 100* 105 106  --  107  CO2 --  26  GLUCOSE 122* 84 109*  --  97  BUN 9 9 <5*  --  <5*  CREATININE 2.28* 1.21 0.95 0.99 0.95  CALCIUM 9.2 8.9 8.7*  --  8.0*   GFR Estimated Creatinine Clearance: 59.6 mL/min (by C-G formula based on Cr of 0.95). Liver Function Tests:  Recent Labs Lab 11/21/15 1818 11/22/15 0537 11/25/15 0709  AST 41 31 17  ALT 26 23 13*  ALKPHOS 100 95 71  BILITOT 1.6* 1.7* 0.8  PROT 7.1  6.5 5.5*  ALBUMIN 3.4* 3.0* 2.6*   No results for input(s): LIPASE, AMYLASE in the last 168 hours.  Recent Labs Lab 11/25/15 0709  AMMONIA 17   Coagulation profile  Recent Labs Lab 11/21/15 1831 11/23/15 1029 11/25/15 0709  INR 1.15 1.00 1.00    CBC:  Recent Labs Lab 11/21/15 1422  11/22/15 0537  11/24/15 1113 11/24/15 1908 11/25/15 0709 11/25/15 1942 11/26/15 0847  WBC 13.1*  --  10.4  --  7.9  --  7.4  --   --   HGB 15.8  < > 14.5  < > 13.4 13.5 12.7* 12.6* 12.5*  HCT 46.2  < > 43.4  < > 40.1  40.7 38.8* 38.2* 37.8*  MCV 97.7  --  97.3  --  97.1  --  96.8  --   --   PLT 214  --  196  --  161  --  160  166  --   --   < > = values in this interval not displayed. Cardiac Enzymes:  Recent Labs Lab 11/21/15 1818 11/21/15 2331 11/22/15 0537  TROPONINI 0.03* 0.03* 0.04*   BNP (last 3 results) No results for input(s): PROBNP in the last 8760 hours. CBG: No results for input(s): GLUCAP in the last 168 hours. D-Dimer:  Recent Labs  11/25/15 0709  DDIMER 1.43*   Hgb A1c: No results for input(s): HGBA1C in the last 72 hours. Lipid Profile: No results for input(s): CHOL, HDL, LDLCALC, TRIG, CHOLHDL, LDLDIRECT in the last 72 hours. Thyroid function studies:  Recent Labs  11/25/15 0709  TSH 1.401   Anemia work up: No results for input(s): VITAMINB12, FOLATE, FERRITIN, TIBC, IRON, RETICCTPCT in the last 72 hours. Sepsis Labs:  Recent Labs Lab 11/21/15 1422 11/22/15 0537 11/24/15 1113 11/25/15 0709  WBC 13.1* 10.4 7.9 7.4   Urine analysis:    Component Value Date/Time   COLORURINE ORANGE* 11/21/2015 2148   APPEARANCEUR TURBID* 11/21/2015 2148   LABSPEC 1.026 11/21/2015 2148   PHURINE 5.0 11/21/2015 2148   GLUCOSEU 100* 11/21/2015 2148   HGBUR NEGATIVE 11/21/2015 2148   BILIRUBINUR MODERATE* 11/21/2015 2148   KETONESUR 15* 11/21/2015 2148   PROTEINUR 100* 11/21/2015 2148   NITRITE POSITIVE* 11/21/2015 2148   LEUKOCYTESUR SMALL* 11/21/2015  2148   Microbiology No results found for this or any previous visit (from the past 240 hour(s)).  Radiology: Ct Chest Wo Contrast  11/24/2015  CLINICAL DATA:  CHF EXAM: CT CHEST WITHOUT CONTRAST TECHNIQUE: Multidetector CT imaging of the chest was performed following the standard protocol without IV contrast. COMPARISON:  Chest radiographs dated 11/21/2015 FINDINGS: Cardiovascular: The heart is normal in size. No pericardial effusion. Three vessel coronary atherosclerosis. Atherosclerotic calcifications of the aortic arch. Mediastinum/Nodes: No suspicious mediastinal lymphadenopathy. No evidence of axillary lymphadenopathy. Visualized thyroid is unremarkable. Lungs/Pleura: Small bilateral pleural effusions, left greater than right. Associated mild patchy bilateral lower lobe opacities, likely atelectasis. No frank interstitial edema. No suspicious pulmonary nodules. Pleural-based calcifications along the right hemithorax. Mild pleural-based nodularity along the fissures bilaterally. Mild emphysematous changes No pneumothorax. Upper Abdomen: Visualized upper abdomen is notable for contrast within the bilateral renal collecting systems, likely related to recent cardiac catheterization. Musculoskeletal: Degenerative changes of the visualized thoracolumbar spine. IMPRESSION: Small bilateral pleural effusions, left greater than right. Associated mild patchy bilateral lower lobe opacities, likely atelectasis. No frank interstitial edema. Electronically Signed   By: Charline Bills M.D.   On: 11/24/2015 21:29    Medications:   . amLODipine  10 mg Oral Daily  . aspirin EC  81 mg Oral Daily  . atorvastatin  80 mg Oral q1800  . ciprofloxacin  400 mg Intravenous Q12H  . feeding supplement  1 Container Oral TID BM  . folic acid  1 mg Oral Daily  . heparin  5,000 Units Subcutaneous Q8H  . metoprolol tartrate  50 mg Oral BID  . mometasone-formoterol  2 puff Inhalation BID  . multivitamin with minerals  1  tablet Oral Daily  . pantoprazole  40 mg Oral BID  . sodium chloride flush  3 mL Intravenous Q12H  . thiamine  100 mg Oral Daily   Continuous Infusions:    Time  spent: 30 minutes     Alazia Crocket  [facility] [phone].  *Please refer to [email or web address], password [email or web address] to get updated schedule on who will round on this patient, as hospitalists switch teams weekly. If 7PM-7AM, please contact night-coverage at [email or web address], password [email or web address] for any overnight needs.  11/26/2015, 1:21 PM

## 2015-11-26 NOTE — Progress Notes (Signed)
2 Days Post-Op Procedure(s) (LRB): Left Heart Cath and Coronary Angiography (N/A) Subjective: No complaints of angina Events noted regarding GI evaluation Patient has significant alcohol history, clear evidence of cirrhosis, and his hemoglobin has dropped from 43% to 38% in the last 5 days. His cardiac status is stable from his proximal LAD stenosis. The patient will need formal GI endoscopic evaluation prior to CABG which will require high-dose anticoagulation.  Objective: Vital signs in last 24 hours: Temp:  [98.7 F (37.1 C)-98.9 F (37.2 C)] 98.7 F (37.1 C) (07/09 0510) Pulse Rate:  [78-82] 81 (07/09 0510) Cardiac Rhythm:  [-] Normal sinus rhythm (07/09 0700) Resp:  [16-18] 18 (07/09 0510) BP: (148-176)/(75-88) 176/88 mmHg (07/09 0510) SpO2:  [98 %-100 %] 99 % (07/09 0823) Weight:  [130 lb 4.7 oz (59.1 kg)] 130 lb 4.7 oz (59.1 kg) (07/09 0510)  Hemodynamic parameters for last 24 hours:    Intake/Output from previous day: 07/08 0701 - 07/09 0700 In: 1260 [P.O.:460; IV Piggyback:800] Out: 700 [Urine:700] Intake/Output this shift:      Lab Results:  Recent Labs  11/24/15 1113  11/25/15 0709 11/25/15 1942 11/26/15 0847  WBC 7.9  --  7.4  --   --   HGB 13.4  < > 12.7* 12.6* 12.5*  HCT 40.1  < > 38.8* 38.2* 37.8*  PLT 161  --  160  166  --   --   < > = values in this interval not displayed. BMET:  Recent Labs  11/23/15 1600 11/24/15 1113 11/25/15 0709  NA 137  --  139  K 4.1  --  3.4*  CL 106  --  107  CO2 25  --  26  GLUCOSE 109*  --  97  BUN <5*  --  <5*  CREATININE 0.95 0.99 0.95  CALCIUM 8.7*  --  8.0*    PT/INR:  Recent Labs  11/25/15 0709  LABPROT 13.4  INR 1.00   ABG    Component Value Date/Time   PHART 7.449 11/25/2015 0940   HCO3 23.6 11/25/2015 0940   TCO2 24.7 11/25/2015 0940   O2SAT 95.7 11/25/2015 0940   CBG (last 3)  No results for input(s): GLUCAP in the last 72 hours.  Assessment/Plan: S/P Procedure(s) (LRB): Left Heart  Cath and Coronary Angiography (N/A) 80% proximal LAD stenosis with history of heavy alcohol abuse and syncopal episode Intermittent atrial fibrillation Not candidate for chronic anticoagulation because of strong alcohol abuse history Decline in hematocrit this hospitalization from 43% to 38%-patient will need formal GI endoscopic evaluation prior to elective CABG     Kathlee Nations Trigt III 11/26/2015

## 2015-11-26 NOTE — Care Management Obs Status (Signed)
MEDICARE OBSERVATION STATUS NOTIFICATION   Patient Details  Name: Derrick Ramos MRN: [id number] Date of Birth: Apr 17, 1946   Medicare Observation Status Notification Given:  Yes (Patient refused to sign notice)    Antony Haste, RN 11/26/2015, 2:06 PM

## 2015-11-26 NOTE — Progress Notes (Addendum)
SUBJECTIVE:  No complaints  OBJECTIVE:   Vitals:   Filed Vitals:   11/25/15 1945 11/25/15 2115 11/26/15 0510 11/26/15 0823  BP:  157/84 176/88   Pulse:  82 81   Temp:  98.9 F (37.2 C) 98.7 F (37.1 C)   TempSrc:  Oral Oral   Resp:  17 18   Height:      Weight:   130 lb 4.7 oz (59.1 kg)   SpO2: 98% 100% 100% 99%   I&O's:   Intake/Output Summary (Last 24 hours) at 11/26/15 1047 Last data filed at 11/26/15 0514  Gross per 24 hour  Intake   1020 ml  Output    525 ml  Net    495 ml   TELEMETRY: Reviewed telemetry pt in NSR with 10 beat run of NSVT     PHYSICAL EXAM General: Well developed, well nourished, in no acute distress Head: Eyes PERRLA, No xanthomas.   Normal cephalic and atramatic  Lungs:   Clear bilaterally to auscultation and percussion. Heart:   HRRR S1 S2 Pulses are 2+ & equal. Abdomen: Bowel sounds are positive, abdomen soft and non-tender without masses  Msk:  Back normal, normal gait. Normal strength and tone for age. Extremities:   No clubbing, cyanosis or edema.  DP +1 Neuro: Alert and oriented X 3. Psych:  Good affect, responds appropriately   LABS: Basic Metabolic Panel:  Recent Labs  11/28/17 1600 11/24/15 1113 11/25/15 0709  NA 137  --  139  K 4.1  --  3.4*  CL 106  --  107  CO2 25  --  26  GLUCOSE 109*  --  97  BUN <5*  --  <5*  CREATININE 0.95 0.99 0.95  CALCIUM 8.7*  --  8.0*   Liver Function Tests:  Recent Labs  11/25/15 0709  AST 17  ALT 13*  ALKPHOS 71  BILITOT 0.8  PROT 5.5*  ALBUMIN 2.6*   No results for input(s): LIPASE, AMYLASE in the last 72 hours. CBC:  Recent Labs  11/24/15 1113  11/25/15 0709 11/25/15 1942 11/26/15 0847  WBC 7.9  --  7.4  --   --   HGB 13.4  < > 12.7* 12.6* 12.5*  HCT 40.1  < > 38.8* 38.2* 37.8*  MCV 97.1  --  96.8  --   --   PLT 161  --  160  166  --   --   < > = values in this interval not displayed. Cardiac Enzymes: No results for input(s): CKTOTAL, CKMB, CKMBINDEX,  TROPONINI in the last 72 hours. BNP: Invalid input(s): POCBNP D-Dimer:  Recent Labs  11/25/15 0709  DDIMER 1.43*   Hemoglobin A1C: No results for input(s): HGBA1C in the last 72 hours. Fasting Lipid Panel: No results for input(s): CHOL, HDL, LDLCALC, TRIG, CHOLHDL, LDLDIRECT in the last 72 hours. Thyroid Function Tests:  Recent Labs  11/25/15 0709  TSH 1.401   Anemia Panel: No results for input(s): VITAMINB12, FOLATE, FERRITIN, TIBC, IRON, RETICCTPCT in the last 72 hours. Coag Panel:   Lab Results  Component Value Date   INR 1.00 11/25/2015   INR 1.00 11/23/2015   INR 1.15 11/21/2015    RADIOLOGY: Dg Chest 2 View  11/21/2015  CLINICAL DATA:  Multiple syncopal episodes. Patient fell, striking head. Initial encounter. EXAM: CHEST  2 VIEW COMPARISON:  None. FINDINGS: 1513 hours. The heart is mildly enlarged. There is aortic tortuosity. The lungs are mildly hyperinflated but clear. There is  some calcification along the right hemidiaphragm. No pleural effusion or pneumothorax is seen. Mild degenerative changes are present throughout the spine. IMPRESSION: No active cardiopulmonary process or acute posttraumatic findings demonstrated. Mild cardiomegaly. Electronically Signed   By: Carey Bullocks M.D.   On: 11/21/2015 15:12   Dg Ribs Unilateral Left  11/22/2015  CLINICAL DATA:  Fall on left side 2 days ago, left lower rib pain EXAM: LEFT RIBS - 2 VIEW COMPARISON:  11/21/2015 FINDINGS: Two views left ribs submitted. No infiltrate or pulmonary edema. No rib fracture is identified. There is no pneumothorax. IMPRESSION: Negative. Electronically Signed   By: Natasha Mead M.D.   On: 11/22/2015 12:38   Ct Head Wo Contrast  11/21/2015  CLINICAL DATA:  Fall, neck pain EXAM: CT HEAD WITHOUT CONTRAST CT CERVICAL SPINE WITHOUT CONTRAST TECHNIQUE: Multidetector CT imaging of the head and cervical spine was performed following the standard protocol without intravenous contrast. Multiplanar CT image  reconstructions of the cervical spine were also generated. COMPARISON:  None. FINDINGS: CT HEAD FINDINGS No skull fracture is noted. Paranasal sinuses and mastoid air cells are unremarkable. Atherosclerotic calcifications of carotid siphon are noted. No intracranial hemorrhage, mass effect or midline shift. Moderate cerebral atrophy. Mild periventricular and patchy subcortical white matter decreased attenuation probable due to chronic small vessel ischemic changes. No acute cortical infarction. No mass lesion is noted on this unenhanced scan. CT CERVICAL SPINE FINDINGS Axial images of the cervical spine shows no acute fracture or subluxation. Atherosclerotic calcifications of vertebral arteries are noted. Computer processed images shows no acute fracture or subluxation. Mild degenerative changes C1-C2 articulation. Mild posterior disc bulge at C3-C4 level. Mild anterior spurring lower endplate of C4 and C5 vertebral body. Mild disc space flattening with minimal posterior disc bulge as at C6-C7 level. No prevertebral soft tissue swelling. Cervical airway is patent. There is no pneumothorax in visualized lung apices. IMPRESSION: 1. No acute intracranial abnormality. There is moderate cerebral atrophy. Mild periventricular and patchy subcortical white matter decreased attenuation probable due to chronic small vessel ischemic changes. 2. No cervical spine acute fracture or subluxation. Mild degenerative changes as described above. Electronically Signed   By: Natasha Mead M.D.   On: 11/21/2015 15:09   Ct Chest Wo Contrast  11/24/2015  CLINICAL DATA:  CHF EXAM: CT CHEST WITHOUT CONTRAST TECHNIQUE: Multidetector CT imaging of the chest was performed following the standard protocol without IV contrast. COMPARISON:  Chest radiographs dated 11/21/2015 FINDINGS: Cardiovascular: The heart is normal in size. No pericardial effusion. Three vessel coronary atherosclerosis. Atherosclerotic calcifications of the aortic arch.  Mediastinum/Nodes: No suspicious mediastinal lymphadenopathy. No evidence of axillary lymphadenopathy. Visualized thyroid is unremarkable. Lungs/Pleura: Small bilateral pleural effusions, left greater than right. Associated mild patchy bilateral lower lobe opacities, likely atelectasis. No frank interstitial edema. No suspicious pulmonary nodules. Pleural-based calcifications along the right hemithorax. Mild pleural-based nodularity along the fissures bilaterally. Mild emphysematous changes No pneumothorax. Upper Abdomen: Visualized upper abdomen is notable for contrast within the bilateral renal collecting systems, likely related to recent cardiac catheterization. Musculoskeletal: Degenerative changes of the visualized thoracolumbar spine. IMPRESSION: Small bilateral pleural effusions, left greater than right. Associated mild patchy bilateral lower lobe opacities, likely atelectasis. No frank interstitial edema. Electronically Signed   By: Charline Bills M.D.   On: 11/24/2015 21:29   Ct Cervical Spine Wo Contrast  11/21/2015  CLINICAL DATA:  Fall, neck pain EXAM: CT HEAD WITHOUT CONTRAST CT CERVICAL SPINE WITHOUT CONTRAST TECHNIQUE: Multidetector CT imaging of the head and cervical  spine was performed following the standard protocol without intravenous contrast. Multiplanar CT image reconstructions of the cervical spine were also generated. COMPARISON:  None. FINDINGS: CT HEAD FINDINGS No skull fracture is noted. Paranasal sinuses and mastoid air cells are unremarkable. Atherosclerotic calcifications of carotid siphon are noted. No intracranial hemorrhage, mass effect or midline shift. Moderate cerebral atrophy. Mild periventricular and patchy subcortical white matter decreased attenuation probable due to chronic small vessel ischemic changes. No acute cortical infarction. No mass lesion is noted on this unenhanced scan. CT CERVICAL SPINE FINDINGS Axial images of the cervical spine shows no acute fracture or  subluxation. Atherosclerotic calcifications of vertebral arteries are noted. Computer processed images shows no acute fracture or subluxation. Mild degenerative changes C1-C2 articulation. Mild posterior disc bulge at C3-C4 level. Mild anterior spurring lower endplate of C4 and C5 vertebral body. Mild disc space flattening with minimal posterior disc bulge as at C6-C7 level. No prevertebral soft tissue swelling. Cervical airway is patent. There is no pneumothorax in visualized lung apices. IMPRESSION: 1. No acute intracranial abnormality. There is moderate cerebral atrophy. Mild periventricular and patchy subcortical white matter decreased attenuation probable due to chronic small vessel ischemic changes. 2. No cervical spine acute fracture or subluxation. Mild degenerative changes as described above. Electronically Signed   By: Natasha Mead M.D.   On: 11/21/2015 15:09   US Renal  11/21/2015  CLINICAL DATA:  Acute kidney injury. EXAM: RENAL / URINARY TRACT ULTRASOUND COMPLETE COMPARISON:  None. FINDINGS: Right Kidney: Length: 11.3 cm. Echogenicity within normal limits. No mass or hydronephrosis visualized. Left Kidney: Length: 11.4 cm. Echogenicity within normal limits. No mass or hydronephrosis visualized. Bladder: Appears normal for degree of bladder distention. IMPRESSION: Normal exam. Electronically Signed   By: Signa Kell M.D.   On: 11/21/2015 19:22    Assessment/Plan:  70 y.o.AA male with a history of HTN, self reported cirrhosis, daily alcohol use, tobacco abuse and PAD who was brought by EMS 11/21/15 after syncope episode x 2. EKG on admission showed AF with CVR, he has since converted to NSR. There apparently was a history of hematemasis but his Hgb has been stable. We are awaiting prior GI work up records, no further GI evaluation planned at this time. Cath done this admission showed Ca++ 80% proximal LM.    Principal Problem:  Syncope and collapse Active Problems:  Atrial fibrillation  (HCC)  CAD- 80% Ca++ proximal LAD  HTN (hypertension)  Hematemesis  AKI (acute kidney injury) (HCC)  Alcohol abuse  Atherosclerotic PVD with intermittent claudication (HCC)  Hypokalemia  Hepatic cirrhosis (HCC)  Laceration of head  Tobacco use disorder  Malnutrition of moderate degree  Nonsustained ventricular tachycardia   PLAN: Appreciated CVTS input. Dr. Donata Clay concerned about risk of GI bleeding with high dose heparin needed for surgery. Feels patient would benefit from LIMA to LAD but overall comorbidities may prohibit him from being a candidate for CABG.GI eval on hold for now as patient did not want to proceed with endoscopy and wants to reconsider when his wife comes tomorrow.   Will await further recs from CVTS and GI. Continue statin/BB. GI stated that it was ok to resume ASA/Plavix. Will hold on plavix until determined whether he is a candidate for CABG. Continue  ASA  daily. Continue BB for nonsustained ventricular tachycardia. He continues to have short runs.  Will increase metoprolol to  BID as he also needs better BP control.    He is currently not on anticoagulation for his  PAF due to h/o GI bleeding. I discussed with patient that he needs to proceed with endoscopy so we can be sure it is safe to place on anticoagulation for stroke prevention as well as proceeding with CABG.    Armanda Magic, MD  11/26/2015  10:47 AM

## 2015-11-26 NOTE — Progress Notes (Signed)
Dr. Blake Divine contacted me that pt is now agreeable to having egd.  Please see my note from yesterday.  Spoke w/ pt myself and reviewed nature, purpose, and risks (anesthesia, perforation, bleeding) of endoscopic evaluation and confirmed that he is still agreeable to proceed. I believe that we will be able to do it tomorrow morning under propofol sedation.  Florencia Reasons, M.D. Pager [phone] If no answer or after 5 PM call [phone]

## 2015-11-27 ENCOUNTER — Encounter (HOSPITAL_COMMUNITY): Payer: Self-pay | Admitting: Anesthesiology

## 2015-11-27 ENCOUNTER — Observation Stay (HOSPITAL_COMMUNITY): Payer: Medicare Other | Admitting: Anesthesiology

## 2015-11-27 ENCOUNTER — Encounter (HOSPITAL_COMMUNITY)
Admission: EM | Disposition: A | Discharge status: Home / Self Care | Payer: Self-pay | Source: Home / Self Care | Attending: Internal Medicine

## 2015-11-27 DIAGNOSIS — K7030 Alcoholic cirrhosis of liver without ascites: Secondary | ICD-10-CM

## 2015-11-27 DIAGNOSIS — I25110 Atherosclerotic heart disease of native coronary artery with unstable angina pectoris: Principal | ICD-10-CM

## 2015-11-27 HISTORY — PX: ESOPHAGOGASTRODUODENOSCOPY (EGD) WITH PROPOFOL: SHX5813

## 2015-11-27 LAB — POCT I-STAT 4, (NA,K, GLUC, HGB,HCT)
Glucose, Bld: 92 mg/dL (ref 65–99)
HCT: 40 % (ref 39.0–52.0)
Hemoglobin: 13.6 g/dL (ref 13.0–17.0)
POTASSIUM: 3.5 mmol/L (ref 3.5–5.1)
SODIUM: 139 mmol/L (ref 135–145)

## 2015-11-27 LAB — BASIC METABOLIC PANEL
Anion gap: 10 (ref 5–15)
CALCIUM: 8.2 mg/dL — AB (ref 8.9–10.3)
CO2: 24 mmol/L (ref 22–32)
Chloride: 101 mmol/L (ref 101–111)
Creatinine, Ser: 0.85 mg/dL (ref 0.61–1.24)
GFR calc non Af Amer: >60 mL/min (ref >60)
Glucose, Bld: 97 mg/dL (ref 65–99)
Potassium: 2.8 mmol/L — ABNORMAL LOW (ref 3.5–5.1)
SODIUM: 135 mmol/L (ref 135–145)

## 2015-11-27 LAB — HEMOGLOBIN AND HEMATOCRIT, BLOOD
HEMATOCRIT: 38 % — AB (ref 39.0–52.0)
HEMATOCRIT: 37.3 % — AB (ref 39.0–52.0)
HEMOGLOBIN: 12.8 g/dL — AB (ref 13.0–17.0)
HEMOGLOBIN: 12.7 g/dL — AB (ref 13.0–17.0)

## 2015-11-27 SURGERY — CORONARY ARTERY BYPASS GRAFTING (CABG)
Anesthesia: General | Site: Chest

## 2015-11-27 SURGERY — ESOPHAGOGASTRODUODENOSCOPY (EGD) WITH PROPOFOL
Anesthesia: General

## 2015-11-27 MED ORDER — SUCCINYLCHOLINE CHLORIDE 20 MG/ML IJ SOLN
INTRAMUSCULAR | Status: DC | PRN
  Administered 2015-11-27: 70 mg via INTRAVENOUS

## 2015-11-27 MED ORDER — FENTANYL CITRATE (PF) 100 MCG/2ML IJ SOLN
INTRAMUSCULAR | Status: DC | PRN
  Administered 2015-11-27: 50 ug via INTRAVENOUS

## 2015-11-27 MED ORDER — HYDROMORPHONE HCL 1 MG/ML IJ SOLN: 0.5000 mg | INTRAMUSCULAR | Status: DC | PRN

## 2015-11-27 MED ORDER — LIDOCAINE HCL (CARDIAC) 20 MG/ML IV SOLN
INTRAVENOUS | Status: DC | PRN
  Administered 2015-11-27: 100 mg via INTRAVENOUS

## 2015-11-27 MED ORDER — ONDANSETRON HCL 4 MG/2ML IJ SOLN
INTRAMUSCULAR | Status: DC | PRN
  Administered 2015-11-27: 4 mg via INTRAVENOUS

## 2015-11-27 MED ORDER — METOPROLOL TARTRATE 50 MG PO TABS
75.0000 mg | ORAL_TABLET | Freq: Two times a day (BID) | ORAL | Status: DC
  Administered 2015-11-27 – 2015-12-01 (×8): 75 mg via ORAL
  Filled 2015-11-27: qty 1
  Filled 2015-11-27: qty 3
  Filled 2015-11-27 (×2): qty 1
  Filled 2015-11-27: qty 3
  Filled 2015-11-27 (×3): qty 1

## 2015-11-27 MED ORDER — PROPOFOL 10 MG/ML IV BOLUS
INTRAVENOUS | Status: DC | PRN
  Administered 2015-11-27: 120 mg via INTRAVENOUS

## 2015-11-27 MED ORDER — HYDRALAZINE HCL 20 MG/ML IJ SOLN: 5.0000 mg | INTRAMUSCULAR | Status: DC | PRN

## 2015-11-27 MED ORDER — PHENYLEPHRINE HCL 10 MG/ML IJ SOLN
INTRAMUSCULAR | Status: DC | PRN
  Administered 2015-11-27: 160 ug via INTRAVENOUS
  Administered 2015-11-27: 120 ug via INTRAVENOUS

## 2015-11-27 MED ORDER — ONDANSETRON HCL 4 MG/2ML IJ SOLN: 4.0000 mg | Freq: Once | INTRAMUSCULAR | Status: DC | PRN

## 2015-11-27 MED ORDER — LACTATED RINGERS IV SOLN
INTRAVENOUS | Status: DC
  Administered 2015-11-27: 09:00:00 via INTRAVENOUS

## 2015-11-27 NOTE — Interval H&P Note (Signed)
History and Physical Interval Note:  11/27/2015 8:50 AM  Derrick Ramos  has presented today for surgery, with the diagnosis of gastrointestinal bleeding  The various methods of treatment have been discussed with the patient and family. After consideration of risks, benefits and other options for treatment, the patient has consented to  Procedure(s): ESOPHAGOGASTRODUODENOSCOPY (EGD) WITH PROPOFOL (N/A) as a surgical intervention .  The patient's history has been reviewed, patient examined, no change in status, stable for surgery.  I have reviewed the patient's chart and labs.  Questions were answered to the patient's satisfaction.     Derrick Ramos M  Assessment:  1.  Coffee ground emesis. 2.  Heme-positive stool.  Plan:  1.  Endoscopy. 2.  Risks (bleeding, infection, bowel perforation that could require surgery, sedation-related changes in cardiopulmonary systems), benefits (identification and possible treatment of source of symptoms, exclusion of certain causes of symptoms), and alternatives (watchful waiting, radiographic imaging studies, empiric medical treatment) of upper endoscopy (EGD) were explained to patient/family in detail and patient wishes to proceed.

## 2015-11-27 NOTE — Progress Notes (Signed)
Per UR review pt does not meet continued OBS stay- attending notified- under the direction of second level physician advisor -advised to give ABN (Advance Beneficiary Notice of Non-coverage)- form given to pt and reviewed- pt opted to continue OBS stay- and have Medicare billed (option 1) copy of ABN placed in shadow chart- and original given to daughter at bedside.

## 2015-11-27 NOTE — Progress Notes (Signed)
CARDIAC REHAB PHASE I   PRE:  Rate/Rhythm: 66 SR    BP: sitting 136/72    SaO2: 98 RA  MODE:  Ambulation: 500 ft   POST:  Rate/Rhythm: 70 SR    BP: sitting 136/66     SaO2: 98 RA  Pt wobbly but no obvious LOB. Denied CP or other c/o. Return to bed, declined chair. Pt practiced IS, inspiring 1500 mL.  [id number]   Derrick Ramos Acen Craun CES, ACSM 11/27/2015 2:15 PM

## 2015-11-27 NOTE — Progress Notes (Signed)
Progress Note    Derrick Ramos  YOK:[id number] DOB: 1945/11/14  DOA: 11/21/2015 PCP: Pcp Not In System    Brief Narrative:   Derrick Ramos is an 70 y.o. male gentleman with a history of cirrhosis (self-reported), daily EtOH use, active tobacco use, PVD, and HTN who presents to the ED accompanied by his wife and daughter who has had recurrent syncopal episodes. Cardiology consulted and he underwent cardiac cath on 7/7 showing LAD disease. CVT surgery consulted and recommendations given.  7/10 EGD.  Assessment/Plan:   Principal Problem:   Syncope and collapse Active Problems:   Atrial fibrillation (HCC)   Hypokalemia   HTN (hypertension)   Hepatic cirrhosis (HCC)   Hematemesis   AKI (acute kidney injury) (HCC)   Laceration of head   Tobacco use disorder   Alcohol abuse   Malnutrition of moderate degree   CAD- 80% Ca++ proximal LAD   Atherosclerotic PVD with intermittent claudication (HCC)   Coronary artery disease involving native coronary artery of native heart without angina pectoris  Syncope possibly from ETOH abuse/ arrhythmias/ orthostatic/: ? Coffee ground emesis.  GI consulted for further eval, obtained old records from one year ago. He underwent EGD showing an antral ulcer and antral erosions. Colonoscopy shows tubular adenoma in the rectum and descending colon which were removed. Recommended to continue with BID PPI.  Plan for EGD on 7/10  Anemia of blood loss/ GI bleed:  Hemoglobin on admission was 15.8 and today its 12.5. Will get anemia panel, stool for occult blood positive.  Assuming he was dehydrated on admission, there is a 3 gm drop in hemoglobin,.  Continue to monitor cbc. Repeat hemoglobin in am.  Transfuse to keep hemoglobin greater than 8.  GI consulted and recommended to get EGD done,scheduled for 7/10.  EGD done on 7/10 showed normal esophagus, gastritis, antral erosions, erythematous duodenopathy .      New onset / Paroxysmal atrial  fibrillation: Echo shows Systolic function was mildly to moderately reduced. The estimated ejection fraction was  in the range of 40% to 45%. There is severe hypokinesis of the entire inferolateral myocardium. There is akinesis of the  basalinferior myocardium. The study was not technically sufficient to allow evaluation of LV diastolic dysfunction due to  atrial fibrillation. [place] score is 3.  But because of his ETOH abuse, not a good candidate for anticoagulation.  Cardiology consulted, underwent cath shows 80% LAD stenosis, circumflex and irght coronary are non obstructive.   Cardiothoracic surgery consulted , recommended EGD prior to CABG,.  EGD done on 7/10 showing gastritis and some antral erosions, no frank bleeding ulcers.  Await cardiothoracic surgery recommendations on surgery.  Appreciate EP recommendations.  Discussed with the patient.     Aki: Improved with gentle hydration. Stopped IV fluids.  Recheck renal parameters are within normal limits.    Hypertension:  Not well controlled.  PRN hydralazine and on amlodipine.   Liver Cirrhosis: Liver function panel wnl.    ETOH abuse: On CIWA protocol.  No signs of withdrawal.   Left rib pain: Rib series ordered and negative for fracture.  Pain control .  Avoid NSAIDS.   Abnormal UA:  Urine cultures not sent on admission. He is asymptomatic. Denies any dysuria.  Continue to monitor. IV cipro started by CVTS. Completed 4 days of treatment.   Fall and scalp wound: No bleeding.     Family Communication/Anticipated D/C date and plan/Code Status   DVT prophylaxis: sq heparin. Anti coagulation on  hold.  Code Status: Full Code.  Family Communication: none at bedside.  Disposition Plan: pending further investigation.   Medical Consultants:    Gastroenterology.   Cardiology  EP  TCTS.   Procedures:  echocardiogram Cardiac catheterization.  Anti-Infectives:   Anti-infectives    Start     Dose/Rate  Route Frequency Ordered Stop   11/24/15 1400  [MAR Hold]  ciprofloxacin (CIPRO) IVPB 400 mg     (MAR Hold since 11/27/15 0842)   400 mg 200 mL/hr over 60 Minutes Intravenous Every 12 hours 11/24/15 1330        Subjective:    Derrick Ramos  denies any chest pain or sob.  Just came back from EGD. No new complaints.   Objective:    Filed Vitals:   11/26/15 2053 11/27/15 0416 11/27/15 0842 11/27/15 0930  BP:  168/81 180/86 171/91  Pulse:  66 68   Temp:  98.1 F (36.7 C)  98.5 F (36.9 C)  TempSrc:  Oral  Oral  Resp:  Height:    (1.676 m)   Weight:  58.469 kg (128 lb 14.4 oz) 58.06 kg (128 lb)   SpO2: 98% 100%      Intake/Output Summary (Last 24 hours) at 11/27/15 0932 Last data filed at 11/27/15 0930  Gross per 24 hour  Intake    780 ml  Output   1250 ml  Net   -470 ml   Filed Weights   11/26/15 0510 11/27/15 0416 11/27/15 0842  Weight: 59.1 kg (130 lb 4.7 oz) 58.469 kg (128 lb 14.4 oz) 58.06 kg (128 lb)    Exam: General exam: Appears calm and comfortable.  Respiratory system: Clear to auscultation. Respiratory effort normal. Cardiovascular system: S1 & S2 heard,  No JVD,  rubs, gallops or clicks. No murmurs. Gastrointestinal system:tender abdomen, soft, non distended. No pain in the rib area. Central nervous system: Alert and oriented. No focal neurological deficits. Extremities: No clubbing, edema, or cyanosis. Skin: No rashes, lesions or ulcers .   Data Reviewed:   I have personally reviewed following labs and imaging studies:  Labs: Basic Metabolic Panel:  Recent Labs Lab 11/21/15 1422 11/22/15 0537 11/23/15 1600 11/24/15 1113 11/25/15 0709  11/27/15 0709 11/27/15 0843  NA 136 139 137  --  139  --  135 139  K 3.3* 4.6 4.1  --  3.4*  < > 2.8* 3.5  CL 100* 105 106  --  107  --  101  --   CO2 --  26  --  24  --   GLUCOSE 122* 84 109*  --  97  --  97 92  BUN 9 9 <5*  --  <5*  --  <5*  --   CREATININE 2.28* 1.21 0.95 0.99  0.95  --  0.85  --   CALCIUM 9.2 8.9 8.7*  --  8.0*  --  8.2*  --   < > = values in this interval not displayed. GFR Estimated Creatinine Clearance: 65.5 mL/min (by C-G formula based on Cr of 0.85). Liver Function Tests:  Recent Labs Lab 11/21/15 1818 11/22/15 0537 11/25/15 0709  AST 41 31 17  ALT 26 23 13*  ALKPHOS 100 95 71  BILITOT 1.6* 1.7* 0.8  PROT 7.1 6.5 5.5*  ALBUMIN 3.4* 3.0* 2.6*   No results for input(s): LIPASE, AMYLASE in the last 168 hours.  Recent Labs Lab 11/25/15 0709  AMMONIA 17  Coagulation profile  Recent Labs Lab 11/21/15 1831 11/23/15 1029 11/25/15 0709  INR 1.15 1.00 1.00    CBC:  Recent Labs Lab 11/21/15 1422  11/22/15 0537  11/24/15 1113  11/25/15 0709 11/25/15 1942 11/26/15 0847 11/26/15 1940 11/27/15 0709 11/27/15 0843  WBC 13.1*  --  10.4  --  7.9  --  7.4  --   --   --   --   --   HGB 15.8  < > 14.5  < > 13.4  < > 12.7* 12.6* 12.5* 14.2 12.8* 13.6  HCT 46.2  < > 43.4  < > 40.1  < > 38.8* 38.2* 37.8* 41.4 38.0* 40.0  MCV 97.7  --  97.3  --  97.1  --  96.8  --   --   --   --   --   PLT 214  --  196  --  161  --  160  166  --   --   --   --   --   < > = values in this interval not displayed. Cardiac Enzymes:  Recent Labs Lab 11/21/15 1818 11/21/15 2331 11/22/15 0537  TROPONINI 0.03* 0.03* 0.04*   BNP (last 3 results) No results for input(s): PROBNP in the last 8760 hours. CBG: No results for input(s): GLUCAP in the last 168 hours. D-Dimer:  Recent Labs  11/25/15 0709  DDIMER 1.43*   Hgb A1c: No results for input(s): HGBA1C in the last 72 hours. Lipid Profile: No results for input(s): CHOL, HDL, LDLCALC, TRIG, CHOLHDL, LDLDIRECT in the last 72 hours. Thyroid function studies:  Recent Labs  11/25/15 0709  TSH 1.401   Anemia work up: No results for input(s): VITAMINB12, FOLATE, FERRITIN, TIBC, IRON, RETICCTPCT in the last 72 hours. Sepsis Labs:  Recent Labs Lab 11/21/15 1422 11/22/15 0537  11/24/15 1113 11/25/15 0709  WBC 13.1* 10.4 7.9 7.4   Urine analysis:    Component Value Date/Time   COLORURINE ORANGE* 11/21/2015 2148   APPEARANCEUR TURBID* 11/21/2015 2148   LABSPEC 1.026 11/21/2015 2148   PHURINE 5.0 11/21/2015 2148   GLUCOSEU 100* 11/21/2015 2148   HGBUR NEGATIVE 11/21/2015 2148   BILIRUBINUR MODERATE* 11/21/2015 2148   KETONESUR 15* 11/21/2015 2148   PROTEINUR 100* 11/21/2015 2148   NITRITE POSITIVE* 11/21/2015 2148   LEUKOCYTESUR SMALL* 11/21/2015 2148   Microbiology Recent Results (from the past 240 hour(s))  Surgical pcr screen     Status: None   Collection Time: 11/26/15 10:02 PM  Result Value Ref Range Status   MRSA, PCR NEGATIVE NEGATIVE Final   Staphylococcus aureus NEGATIVE NEGATIVE Final    Comment:        The Xpert SA Assay (FDA approved for NASAL specimens in patients over 48 years of age), is one component of a comprehensive surveillance program.  Test performance has been validated by [facility] for patients greater than or equal to 73 year old. It is not intended to diagnose infection nor to guide or monitor treatment.     Radiology: No results found.  Medications:   . [MAR Hold] amLODipine  10 mg Oral Daily  . [MAR Hold] aspirin EC  81 mg Oral Daily  . [MAR Hold] atorvastatin  80 mg Oral q1800  . [MAR Hold] ciprofloxacin  400 mg Intravenous Q12H  . [MAR Hold] feeding supplement  1 Container Oral TID BM  . [MAR Hold] folic acid  1 mg Oral Daily  . [MAR Hold] heparin  5,000 Units Subcutaneous  Q8H  . [MAR Hold] metoprolol tartrate  50 mg Oral BID  . mometasone-formoterol  2 puff Inhalation BID  . [MAR Hold] multivitamin with minerals  1 tablet Oral Daily  . [MAR Hold] pantoprazole  40 mg Oral BID  . [MAR Hold] sodium chloride flush  3 mL Intravenous Q12H  . [MAR Hold] thiamine  100 mg Oral Daily   Continuous Infusions: . sodium chloride 20 mL/hr at 11/26/15 2015  . lactated ringers 10 mL/hr at 11/27/15 0855    Time  spent: 30 minutes     Janki Dike  [facility] [phone].  *Please refer to [email or web address], password [email or web address] to get updated schedule on who will round on this patient, as hospitalists switch teams weekly. If 7PM-7AM, please contact night-coverage at [email or web address], password [email or web address] for any overnight needs.  11/27/2015, 9:32 AM

## 2015-11-27 NOTE — Anesthesia Procedure Notes (Signed)
Procedure Name: Intubation Date/Time: 11/27/2015 9:08 AM Performed by: Shirlyn Goltz Pre-anesthesia Checklist: Patient identified, Emergency Drugs available, Suction available and Patient being monitored Patient Re-evaluated:Patient Re-evaluated prior to inductionOxygen Delivery Method: Circle system utilized Preoxygenation: Pre-oxygenation with 100% oxygen Intubation Type: IV induction Ventilation: Mask ventilation without difficulty Laryngoscope Size: Mac and 4 Grade View: Grade I Tube type: Oral Tube size: 7.5 mm Number of attempts: 1 Airway Equipment and Method: Stylet and LTA kit utilized Placement Confirmation: ETT inserted through vocal cords under direct vision,  positive ETCO2 and breath sounds checked- equal and bilateral Secured at: 22 cm Tube secured with: Tape Dental Injury: Teeth and Oropharynx as per pre-operative assessment

## 2015-11-27 NOTE — Progress Notes (Signed)
PT Cancellation Note  Patient Details Name: Derrick Ramos MRN: [id number] DOB: [birth year]   Cancelled Treatment:    Reason Eval/Treat Not Completed: Patient declined, no reason specified. Pt states he is sleepy this morning and declined working with therapy. Max encouragement provided to participate however pt continued to refuse. Will continue to follow and progress as able per POC.    Conni Slipper 11/27/2015, 11:45 AM   Conni Slipper, PT, DPT Acute Rehabilitation Services Pager: [phone]

## 2015-11-27 NOTE — Op Note (Signed)
[facility] Patient Name: Derrick Ramos Procedure Date : 11/27/2015 MRN: [id number] Attending MD: Willis Modena , MD Date of Birth: 01/10/1946 CSN: [id number] Age: 70 Admit Type: Inpatient Procedure:                Upper GI endoscopy Indications:              Coffee-ground emesis, Heme positive stool Providers:                Willis Modena, MD, Tomma Rakers, RN, Arlee Muslim, Technician, Merideth Abbey, CRNA Referring MD:             Dr. Armanda Magic (Cardiology) Medicines:                General Anesthesia Complications:            No immediate complications. Estimated Blood Loss:     Estimated blood loss: none. Procedure:                Pre-Anesthesia Assessment:                           - Prior to the procedure, a History and Physical                            was performed, and patient medications and                            allergies were reviewed. The patient's tolerance of                            previous anesthesia was also reviewed. The risks                            and benefits of the procedure and the sedation                            options and risks were discussed with the patient.                            All questions were answered, and informed consent                            was obtained. Prior Anticoagulants: The patient has                            taken no previous anticoagulant or antiplatelet                            agents. ASA Grade Assessment: III - A patient with                            severe systemic disease. After reviewing the risks  and benefits, the patient was deemed in                            satisfactory condition to undergo the procedure.                           After obtaining informed consent, the endoscope was                            passed under direct vision. Throughout the                            procedure, the patient's blood  pressure, pulse, and                            oxygen saturations were monitored continuously. The                            Endoscope was introduced through the mouth, and                            advanced to the. The Endoscope was introduced                            through the mouth, and advanced to the second part                            of duodenum. The upper GI endoscopy was                            accomplished without difficulty. The patient                            tolerated the procedure well. Scope In: Scope Out: Findings:      The examined esophagus was normal.      Patchy moderate inflammation characterized by erythema and granularity       was found in the cardia and in the gastric fundus.      A few localized small erosions were found in the gastric antrum and in       the prepyloric region of the stomach.      The exam of the stomach was otherwise normal.      Patchy mildly erythematous mucosa was found in the duodenal bulb.      The exam of the duodenum was otherwise normal. Impression:               - Normal esophagus.                           - Gastritis.                           - Erosive gastropathy.                           - Erythematous duodenopathy.                           -  The examination was otherwise normal. Doubt there                            is any significance to the erythema and erosions                            seen on endoscopy, and doubt there is any                            significant risk for bleeding from these areas. Moderate Sedation:      None Recommendation:           - Return patient to hospital ward for ongoing care.                           - Clear liquid diet today.                           - Continue present medications.                           - Continue PPI (Protonix 40 mg po qd, or the                            equivalent) indefinitely, so long as patient is on                             anticoagulation or antiplatelet thereapy.                           [facility] will sign-off; no [facility] outpatient                            follow-up is felt necessary; please call with any                            questions; thank you for the consultation. Procedure Code(s):        --- Professional ---                           [phone], Esophagogastroduodenoscopy, flexible,                            transoral; diagnostic, including collection of                            specimen(s) by brushing or washing, when performed                            (separate procedure) Diagnosis Code(s):        --- Professional ---                           K29.70, Gastritis, unspecified, without bleeding  K31.89, Other diseases of stomach and duodenum                           K92.0, Hematemesis                           R19.5, Other fecal abnormalities CPT copyright 2016 [employer]. All rights reserved. The codes documented in this report are preliminary and upon coder review may  be revised to meet current compliance requirements. Willis Modena, MD 11/27/2015 9:36:31 AM This report has been signed electronically. Number of Addenda: 0

## 2015-11-27 NOTE — Transfer of Care (Signed)
Immediate Anesthesia Transfer of Care Note  Patient: Derrick Ramos  Procedure(s) Performed: Procedure(s): ESOPHAGOGASTRODUODENOSCOPY (EGD) WITH PROPOFOL (N/A)  Patient Location: PACU and Endoscopy Unit  Anesthesia Type:General  Level of Consciousness: awake, alert , oriented and patient cooperative  Airway & Oxygen Therapy: Patient Spontanous Breathing and Patient connected to nasal cannula oxygen  Post-op Assessment: Report given to RN and Post -op Vital signs reviewed and stable  Post vital signs: Reviewed and stable  Last Vitals:  Filed Vitals:   11/27/15 0416 11/27/15 0842  BP: 168/81 180/86  Pulse: 66 68  Temp: 36.7 C   Resp: 18 14    Last Pain:  Filed Vitals:   11/27/15 0843  PainSc: 0-No pain      Patients Stated Pain Goal: 0 (11/22/15 0550)  Complications: No apparent anesthesia complications

## 2015-11-27 NOTE — H&P (View-Only) (Signed)
[facility] Consult Note  Referring Provider: No ref. provider found Primary Care Physician:  Pcp Not In System Primary Gastroenterologist:  Dr.  Laurel Dimmer Complaint: Consult for dark emesis and heme positive stool HPI: Derrick Ramos is an 70 y.o. black male  presented with multiple falls and syncope with head laceration. During ER evaluation he reported that 3 and 4 days ago he had some "dark" emesis, none since. He denies any dark stools. He also reported that he was told he has cirrhosis but appears to know little about that other that was diagnosed prior to his moving here from [place] through 4 years ago. He also relates being admitted for GI bleed in [place] in 2016 which is apparently manifest as rectal bleeding. He states he had a colonoscopy and endoscopy but does not know the results. He is a heavy drinker. On presentation here, his hemoglobin, liver function test, platelet count, prothrombin time are all normal. He did have an occult heme positive stool  Past Medical History  Diagnosis Date  . Hypertension   . GERD (gastroesophageal reflux disease)   . Atrial fibrillation ([place])     Archie Endo 11/21/2015  . History of blood transfusion 2016    related to GI bleed/notes 11/21/2015  . Hepatic cirrhosis ([place])     Archie Endo 11/21/2015  . PVD (peripheral vascular disease) ([place])     Archie Endo 11/21/2015  . Syncope and collapse     required staples to back of head/notes 11/21/2015  . Hematemesis     Archie Endo 11/21/2015  . Acute kidney injury ([place])     Archie Endo 11/21/2015  . H/O syncope     recurrent episodes/notes 11/21/2015  . GI bleed 2016    Archie Endo 11/21/2015    Past Surgical History  Procedure Laterality Date  . Colonoscopy    . Esophagogastroduodenoscopy      Medications Prior to Admission  Medication Sig Dispense Refill  . amLODipine (NORVASC) 10 MG tablet Take 1 tablet by mouth daily.    Marland Kitchen loratadine (CLARITIN) 10 MG tablet Take 1 tablet by mouth daily.    . metoprolol tartrate  (LOPRESSOR) 25 MG tablet Take 1 tablet by mouth 2 (two) times daily.    . ranitidine (ZANTAC) 150 MG capsule Take 1 capsule by mouth 2 (two) times daily.      Allergies: No Known Allergies  History reviewed. No pertinent family history.  Social History:  reports that he has been smoking Cigarettes.  He has a 6.6 pack-year smoking history. He has never used smokeless tobacco. He reports that he drinks about 156.6 oz of alcohol per week. He reports that he does not use illicit drugs.  Review of Systems: negative except As above   Blood pressure 130/83, pulse 89, temperature 98.5 F (36.9 C), temperature source Oral, resp. rate 18, height 5' 6"  (1.676 m), weight 57.6 kg (126 lb 15.8 oz), SpO2 100 %. Head: Normocephalic, without obvious abnormality, atraumatic Neck: no adenopathy, no carotid bruit, no JVD, supple, symmetrical, trachea midline and thyroid not enlarged, symmetric, no tenderness/mass/nodules Resp: clear to auscultation bilaterally Cardio: regular rate and rhythm, S1, S2 normal, no murmur, click, rub or gallop GI: Abdomen soft nondistended no organomegaly or shifting dullness. Extremities: extremities normal, atraumatic, no cyanosis or edema  Results for orders placed or performed during the hospital encounter of 11/21/15 (from the past 48 hour(s))  Basic metabolic panel     Status: Abnormal   Collection Time: 11/21/15  2:22 PM  Result Value Ref Range  Sodium 136 135 - 145 mmol/L   Potassium 3.3 (L) 3.5 - 5.1 mmol/L   Chloride 100 (L) 101 - 111 mmol/L   CO2 24 22 - 32 mmol/L   Glucose, Bld 122 (H) 65 - 99 mg/dL   BUN 9 6 - 20 mg/dL   Creatinine, Ser 2.28 (H) 0.61 - 1.24 mg/dL   Calcium 9.2 8.9 - 10.3 mg/dL   GFR calc non Af Amer 27 (L) >60 mL/min   GFR calc Af Amer 32 (L) >60 mL/min    Comment: (NOTE) The eGFR has been calculated using the CKD EPI equation. This calculation has not been validated in all clinical situations. eGFR's persistently <60 mL/min signify  possible Chronic Kidney Disease.    Anion gap 12 5 - 15  CBC     Status: Abnormal   Collection Time: 11/21/15  2:22 PM  Result Value Ref Range   WBC 13.1 (H) 4.0 - 10.5 K/uL   RBC 4.73 4.22 - 5.81 MIL/uL   Hemoglobin 15.8 13.0 - 17.0 g/dL   HCT 46.2 39.0 - 52.0 %   MCV 97.7 78.0 - 100.0 fL   MCH 33.4 26.0 - 34.0 pg   MCHC 34.2 30.0 - 36.0 g/dL   RDW 13.0 11.5 - 15.5 %   Platelets 214 150 - 400 K/uL  I-stat troponin, ED (not at [facility])     Status: None   Collection Time: 11/21/15  4:14 PM  Result Value Ref Range   Troponin i, poc 0.02 0.00 - 0.08 ng/mL   Comment 3            Comment: Due to the release kinetics of cTnI, a negative result within the first hours of the onset of symptoms does not rule out myocardial infarction with certainty. If myocardial infarction is still suspected, repeat the test at appropriate intervals.   T4, free     Status: None   Collection Time: 11/21/15  6:18 PM  Result Value Ref Range   Free T4 0.87 0.61 - 1.12 ng/dL    Comment: (NOTE) Biotin ingestion may interfere with free T4 tests. If the results are inconsistent with the TSH level, previous test results, or the clinical presentation, then consider biotin interference. If needed, order repeat testing after stopping biotin.   Troponin I     Status: Abnormal   Collection Time: 11/21/15  6:18 PM  Result Value Ref Range   Troponin I 0.03 (HH) <0.03 ng/mL    Comment: CRITICAL RESULT CALLED TO, READ BACK BY AND VERIFIED WITH: Cleda Daub RN [phone] 1919 GRE[address] R   Hepatic function panel     Status: Abnormal   Collection Time: 11/21/15  6:18 PM  Result Value Ref Range   Total Protein 7.1 6.5 - 8.1 g/dL   Albumin 3.4 (L) 3.5 - 5.0 g/dL   AST 41 15 - 41 U/L   ALT 26 17 - 63 U/L   Alkaline Phosphatase 100 38 - 126 U/L   Total Bilirubin 1.6 (H) 0.3 - 1.2 mg/dL   Bilirubin, Direct 0.3 0.1 - 0.5 mg/dL   Indirect Bilirubin 1.3 (H) 0.3 - 0.9 mg/dL  TSH     Status: None   Collection Time: 11/21/15  6:19  PM  Result Value Ref Range   TSH 0.996 0.350 - 4.500 uIU/mL  Hemoglobin A1c     Status: Abnormal   Collection Time: 11/21/15  6:23 PM  Result Value Ref Range   Hgb A1c MFr Bld 4.7 (L) 4.8 -  5.6 %    Comment: (NOTE)         Pre-diabetes: 5.7 - 6.4         Diabetes: >6.4         Glycemic control for adults with diabetes: <7.0    Mean Plasma Glucose 88 mg/dL    Comment: (NOTE) Performed At: [facility] [place], [place] [id number] Lindon Romp MD HW:[id number]   Protime-INR     Status: None   Collection Time: 11/21/15  6:31 PM  Result Value Ref Range   Prothrombin Time 14.8 11.6 - 15.2 seconds   INR 1.15 0.00 - 1.49  APTT     Status: None   Collection Time: 11/21/15  6:31 PM  Result Value Ref Range   aPTT 30 24 - 37 seconds  Urinalysis, Routine w reflex microscopic     Status: Abnormal   Collection Time: 11/21/15  9:48 PM  Result Value Ref Range   Color, Urine ORANGE (A) YELLOW    Comment: BIOCHEMICALS MAY BE AFFECTED BY COLOR   APPearance TURBID (A) CLEAR   Specific Gravity, Urine 1.026 1.005 - 1.030   pH 5.0 5.0 - 8.0   Glucose, UA 100 (A) NEGATIVE mg/dL   Hgb urine dipstick NEGATIVE NEGATIVE   Bilirubin Urine MODERATE (A) NEGATIVE   Ketones, ur 15 (A) NEGATIVE mg/dL   Protein, ur 100 (A) NEGATIVE mg/dL   Nitrite POSITIVE (A) NEGATIVE   Leukocytes, UA SMALL (A) NEGATIVE  Urine microscopic-add on     Status: Abnormal   Collection Time: 11/21/15  9:48 PM  Result Value Ref Range   Squamous Epithelial / LPF 6-30 (A) NONE SEEN   WBC, UA 0-5 0 - 5 WBC/hpf   RBC / HPF 0-5 0 - 5 RBC/hpf   Bacteria, UA FEW (A) NONE SEEN   Casts HYALINE CASTS (A) NEGATIVE    Comment: GRANULAR CAST  Occult blood card to lab, stool RN will collect     Status: Abnormal   Collection Time: 11/21/15 10:03 PM  Result Value Ref Range   Fecal Occult Bld POSITIVE (A) NEGATIVE  Troponin I     Status: Abnormal   Collection Time: 11/21/15 11:31 PM  Result Value Ref Range    Troponin I 0.03 (HH) <0.03 ng/mL    Comment: CRITICAL VALUE NOTED.  VALUE IS CONSISTENT WITH PREVIOUSLY REPORTED AND CALLED VALUE.  Hemoglobin and hematocrit, blood     Status: None   Collection Time: 11/21/15 11:34 PM  Result Value Ref Range   Hemoglobin 14.2 13.0 - 17.0 g/dL   HCT 42.8 39.0 - 52.0 %  Troponin I     Status: Abnormal   Collection Time: 11/22/15  5:37 AM  Result Value Ref Range   Troponin I 0.04 (HH) <0.03 ng/mL    Comment: CRITICAL VALUE NOTED.  VALUE IS CONSISTENT WITH PREVIOUSLY REPORTED AND CALLED VALUE.  CBC     Status: None   Collection Time: 11/22/15  5:37 AM  Result Value Ref Range   WBC 10.4 4.0 - 10.5 K/uL   RBC 4.46 4.22 - 5.81 MIL/uL   Hemoglobin 14.5 13.0 - 17.0 g/dL   HCT 43.4 39.0 - 52.0 %   MCV 97.3 78.0 - 100.0 fL   MCH 32.5 26.0 - 34.0 pg   MCHC 33.4 30.0 - 36.0 g/dL   RDW 13.2 11.5 - 15.5 %   Platelets 196 150 - 400 K/uL  Comprehensive metabolic panel     Status: Abnormal  Collection Time: 11/22/15  5:37 AM  Result Value Ref Range   Sodium 139 135 - 145 mmol/L   Potassium 4.6 3.5 - 5.1 mmol/L    Comment: DELTA CHECK NOTED NO VISIBLE HEMOLYSIS    Chloride 105 101 - 111 mmol/L   CO2 26 22 - 32 mmol/L   Glucose, Bld 84 65 - 99 mg/dL   BUN 9 6 - 20 mg/dL   Creatinine, Ser 1.21 0.61 - 1.24 mg/dL    Comment: DELTA CHECK NOTED   Calcium 8.9 8.9 - 10.3 mg/dL   Total Protein 6.5 6.5 - 8.1 g/dL   Albumin 3.0 (L) 3.5 - 5.0 g/dL   AST 31 15 - 41 U/L   ALT 23 17 - 63 U/L   Alkaline Phosphatase 95 38 - 126 U/L   Total Bilirubin 1.7 (H) 0.3 - 1.2 mg/dL   GFR calc non Af Amer 58 (L) >60 mL/min   GFR calc Af Amer >60 >60 mL/min    Comment: (NOTE) The eGFR has been calculated using the CKD EPI equation. This calculation has not been validated in all clinical situations. eGFR's persistently <60 mL/min signify possible Chronic Kidney Disease.    Anion gap 8 5 - 15   Dg Chest 2 View  11/21/2015  CLINICAL DATA:  Multiple syncopal episodes. Patient  fell, striking head. Initial encounter. EXAM: CHEST  2 VIEW COMPARISON:  None. FINDINGS: 1513 hours. The heart is mildly enlarged. There is aortic tortuosity. The lungs are mildly hyperinflated but clear. There is some calcification along the right hemidiaphragm. No pleural effusion or pneumothorax is seen. Mild degenerative changes are present throughout the spine. IMPRESSION: No active cardiopulmonary process or acute posttraumatic findings demonstrated. Mild cardiomegaly. Electronically Signed   By: Richardean Sale M.D.   On: 11/21/2015 15:12   Ct Head Wo Contrast  11/21/2015  CLINICAL DATA:  Fall, neck pain EXAM: CT HEAD WITHOUT CONTRAST CT CERVICAL SPINE WITHOUT CONTRAST TECHNIQUE: Multidetector CT imaging of the head and cervical spine was performed following the standard protocol without intravenous contrast. Multiplanar CT image reconstructions of the cervical spine were also generated. COMPARISON:  None. FINDINGS: CT HEAD FINDINGS No skull fracture is noted. Paranasal sinuses and mastoid air cells are unremarkable. Atherosclerotic calcifications of carotid siphon are noted. No intracranial hemorrhage, mass effect or midline shift. Moderate cerebral atrophy. Mild periventricular and patchy subcortical white matter decreased attenuation probable due to chronic small vessel ischemic changes. No acute cortical infarction. No mass lesion is noted on this unenhanced scan. CT CERVICAL SPINE FINDINGS Axial images of the cervical spine shows no acute fracture or subluxation. Atherosclerotic calcifications of vertebral arteries are noted. Computer processed images shows no acute fracture or subluxation. Mild degenerative changes C1-C2 articulation. Mild posterior disc bulge at C3-C4 level. Mild anterior spurring lower endplate of C4 and C5 vertebral body. Mild disc space flattening with minimal posterior disc bulge as at C6-C7 level. No prevertebral soft tissue swelling. Cervical airway is patent. There is no  pneumothorax in visualized lung apices. IMPRESSION: 1. No acute intracranial abnormality. There is moderate cerebral atrophy. Mild periventricular and patchy subcortical white matter decreased attenuation probable due to chronic small vessel ischemic changes. 2. No cervical spine acute fracture or subluxation. Mild degenerative changes as described above. Electronically Signed   By: Lahoma Crocker M.D.   On: 11/21/2015 15:09   Ct Cervical Spine Wo Contrast  11/21/2015  CLINICAL DATA:  Fall, neck pain EXAM: CT HEAD WITHOUT CONTRAST CT CERVICAL SPINE WITHOUT CONTRAST  TECHNIQUE: Multidetector CT imaging of the head and cervical spine was performed following the standard protocol without intravenous contrast. Multiplanar CT image reconstructions of the cervical spine were also generated. COMPARISON:  None. FINDINGS: CT HEAD FINDINGS No skull fracture is noted. Paranasal sinuses and mastoid air cells are unremarkable. Atherosclerotic calcifications of carotid siphon are noted. No intracranial hemorrhage, mass effect or midline shift. Moderate cerebral atrophy. Mild periventricular and patchy subcortical white matter decreased attenuation probable due to chronic small vessel ischemic changes. No acute cortical infarction. No mass lesion is noted on this unenhanced scan. CT CERVICAL SPINE FINDINGS Axial images of the cervical spine shows no acute fracture or subluxation. Atherosclerotic calcifications of vertebral arteries are noted. Computer processed images shows no acute fracture or subluxation. Mild degenerative changes C1-C2 articulation. Mild posterior disc bulge at C3-C4 level. Mild anterior spurring lower endplate of C4 and C5 vertebral body. Mild disc space flattening with minimal posterior disc bulge as at C6-C7 level. No prevertebral soft tissue swelling. Cervical airway is patent. There is no pneumothorax in visualized lung apices. IMPRESSION: 1. No acute intracranial abnormality. There is moderate cerebral  atrophy. Mild periventricular and patchy subcortical white matter decreased attenuation probable due to chronic small vessel ischemic changes. 2. No cervical spine acute fracture or subluxation. Mild degenerative changes as described above. Electronically Signed   By: Lahoma Crocker M.D.   On: 11/21/2015 15:09   US Renal  11/21/2015  CLINICAL DATA:  Acute kidney injury. EXAM: RENAL / URINARY TRACT ULTRASOUND COMPLETE COMPARISON:  None. FINDINGS: Right Kidney: Length: 11.3 cm. Echogenicity within normal limits. No mass or hydronephrosis visualized. Left Kidney: Length: 11.4 cm. Echogenicity within normal limits. No mass or hydronephrosis visualized. Bladder: Appears normal for degree of bladder distention. IMPRESSION: Normal exam. Electronically Signed   By: Kerby Moors M.D.   On: 11/21/2015 19:22    Assessment: Heme positive stool with self-report of "dark" emesis, now resolved. Very questionable self-report of cirrhosis and apparent admission for GI bleeding with EGD and colonoscopy and [place] a year ago. Plan:  Empiric PPI therapy Try to obtain records from [facility] regarding his previous admission. No urgent need for endoscopic reevaluation pending receipt of the above. Will follow with you. Terin Dierolf C 11/22/2015, 12:06 PM  Pager [phone] If no answer or after 5 PM call [phone]

## 2015-11-27 NOTE — Anesthesia Preprocedure Evaluation (Signed)
Anesthesia Evaluation  Patient identified by MRN, date of birth, ID band Patient awake    Reviewed: Allergy & Precautions, NPO status , Patient's Chart, lab work & pertinent test results, reviewed documented beta blocker date and time   Airway Mallampati: I  TM Distance: >3 FB     Dental   Pulmonary Current Smoker,     + decreased breath sounds      Cardiovascular hypertension, + CAD and + Peripheral Vascular Disease  Normal cardiovascular exam     Neuro/Psych    GI/Hepatic GERD  ,(+) Cirrhosis       ,   Endo/Other    Renal/GU Renal disease     Musculoskeletal   Abdominal   Peds  Hematology   Anesthesia Other Findings   Reproductive/Obstetrics                             Anesthesia Physical Anesthesia Plan  ASA: III  Anesthesia Plan: MAC and General   Post-op Pain Management:    Induction: Intravenous  Airway Management Planned: Mask and Oral ETT  Additional Equipment:   Intra-op Plan:   Post-operative Plan:   Informed Consent: I have reviewed the patients History and Physical, chart, labs and discussed the procedure including the risks, benefits and alternatives for the proposed anesthesia with the patient or authorized representative who has indicated his/her understanding and acceptance.     Plan Discussed with: CRNA, Anesthesiologist and Surgeon  Anesthesia Plan Comments:         Anesthesia Quick Evaluation

## 2015-11-27 NOTE — Progress Notes (Signed)
Patient Name: Derrick Ramos Date of Encounter: 11/27/2015  Hospital Problem List     Principal Problem:   Syncope and collapse Active Problems:   Atrial fibrillation (HCC)   Hypokalemia   HTN (hypertension)   Hepatic cirrhosis (HCC)   Hematemesis   AKI (acute kidney injury) (HCC)   Laceration of head   Tobacco use disorder   Alcohol abuse   Malnutrition of moderate degree   CAD- 80% Ca++ proximal LAD   Atherosclerotic PVD with intermittent claudication (HCC)   Coronary artery disease involving native coronary artery of native heart without angina pectoris    Subjective   No complaints, recently returned from endoscopy  Inpatient Medications    . amLODipine  10 mg Oral Daily  . aspirin EC  81 mg Oral Daily  . atorvastatin  80 mg Oral q1800  . ciprofloxacin  400 mg Intravenous Q12H  . feeding supplement  1 Container Oral TID BM  . folic acid  1 mg Oral Daily  . heparin  5,000 Units Subcutaneous Q8H  . metoprolol tartrate  50 mg Oral BID  . multivitamin with minerals  1 tablet Oral Daily  . pantoprazole  40 mg Oral BID  . sodium chloride flush  3 mL Intravenous Q12H  . thiamine  100 mg Oral Daily    Vital Signs    Filed Vitals:   11/27/15 0935 11/27/15 0940 11/27/15 0945 11/27/15 1018  BP: 176/88 189/89 180/97 144/94  Pulse: 71 70 72 72  Temp:      TempSrc:      Resp: 14 14 13    Height:      Weight:      SpO2: 100% 100% 100%     Intake/Output Summary (Last 24 hours) at 11/27/15 1147 Last data filed at 11/27/15 1023  Gross per 24 hour  Intake    540 ml  Output   1350 ml  Net   -810 ml   Filed Weights   11/26/15 0510 11/27/15 0416 11/27/15 0842  Weight: 130 lb 4.7 oz (59.1 kg) 128 lb 14.4 oz (58.469 kg) 128 lb (58.06 kg)    Physical Exam    General: Pleasant, NAD. Neuro: Alert and oriented X 3. Moves all extremities spontaneously. Psych: Normal affect. HEENT:  Normal  Neck: Supple without bruits or JVD. Lungs:  Resp regular and unlabored,  CTA. Heart: RRR no s3, s4, or murmurs. Abdomen: Soft, non-tender, non-distended, BS + x 4.  Extremities: No clubbing, cyanosis, trace LLE edema. DP/PT/Radials 2+ and equal bilaterally.  Labs    CBC  Recent Labs  11/25/15 0709  11/27/15 0709 11/27/15 0843  WBC 7.4  --   --   --   HGB 12.7*  < > 12.8* 13.6  HCT 38.8*  < > 38.0* 40.0  MCV 96.8  --   --   --   PLT 160  166  --   --   --   < > = values in this interval not displayed. Basic Metabolic Panel  Recent Labs  11/25/15 0709  11/27/15 0709 11/27/15 0843  NA 139  --  135 139  K 3.4*  < > 2.8* 3.5  CL 107  --  101  --   CO2 26  --  24  --   GLUCOSE 97  --  97 92  BUN <5*  --  <5*  --   CREATININE 0.95  --  0.85  --   CALCIUM 8.0*  --  8.2*  --   < > =  values in this interval not displayed. Liver Function Tests  Recent Labs  11/25/15 0709  AST 17  ALT 13*  ALKPHOS 71  BILITOT 0.8  PROT 5.5*  ALBUMIN 2.6*   No results for input(s): LIPASE, AMYLASE in the last 72 hours. Cardiac Enzymes No results for input(s): CKTOTAL, CKMB, CKMBINDEX, TROPONINI in the last 72 hours. BNP Invalid input(s): POCBNP D-Dimer  Recent Labs  11/25/15 0709  DDIMER 1.43*   Hemoglobin A1C No results for input(s): HGBA1C in the last 72 hours. Fasting Lipid Panel No results for input(s): CHOL, HDL, LDLCALC, TRIG, CHOLHDL, LDLDIRECT in the last 72 hours. Thyroid Function Tests  Recent Labs  11/25/15 0709  TSH 1.401    Telemetry    SR with freq PVCs and Vent Bigeminy   ECG    No recent  Radiology     Assessment & Plan    70 y.o.AA male with a history of HTN, self reported cirrhosis, daily alcohol use, tobacco abuse and PAD who was brought by EMS 11/21/15 after syncope episode x 2. EKG on admission showed AF with CVR, he has since converted to NSR. There apparently was a history of hematemasis but his Hgb has been stable. Cath done this admission showed Ca++ 80% proximal LM  1. CAD: CVTS following the patient and  feel that he would benefit from LIMA to LAD, but concerned that overall comobidities may prohibit from being a good canadidate for CABG. Pt decided to proceed with GI evaluation this morning, who felt appropriate to continue on PPI with anticoagulation/antioplatelet therapy without significant risk for bleeding from erythema/erosions noted on scope. Will await further recs from CVTS.  -- Continue statin/BB. Will hold on plavix until determined whether he is a candidate for CABG.Continue ASA  daily. -- Metoprolol was increased to  BID yesterday, continues to have freq PVCs, but no NSVT. Continue current dose -- Not currently on anticoagulation for PAF due to h/o GI bleeding, but cleared by GI to continue if needed with PPI.  2. HTN: Readings still remain with borderline control. May need additional agent. Monitor.  3. Paroxysmal AFib with RVR, currently NSR  Signed, Laverda Page NP-C Pager [phone]  I have seen and examined the patient along with Laverda Page NP-C.  I have reviewed the chart, notes and new data.  I agree with NP's note.  Key new complaints: still woozy after sedation for EGD Key examination changes: frequent ectopy (PVCs), no signs of hypervolemia Key new findings / data: EGD report reviewed  PLAN: Increase beta blocker dose.  Await final recommendations regarding CABG. EGD results appear to be favorable. Hgb stable. Hold off anticoagulants or clopidogrel until the decision regarding surgery is reached.  Thurmon Fair, MD, [facility] [phone] 11/27/2015, 3:02 PM

## 2015-11-27 NOTE — Anesthesia Postprocedure Evaluation (Signed)
Anesthesia Post Note  Patient: Derrick Ramos  Procedure(s) Performed: Procedure(s) (LRB): ESOPHAGOGASTRODUODENOSCOPY (EGD) WITH PROPOFOL (N/A)  Patient location during evaluation: PACU Anesthesia Type: General Level of consciousness: awake, oriented and patient cooperative Pain management: pain level controlled Vital Signs Assessment: post-procedure vital signs reviewed and stable Respiratory status: spontaneous breathing and respiratory function stable Cardiovascular status: stable Anesthetic complications: no    Last Vitals:  Filed Vitals:   11/27/15 0930 11/27/15 0935  BP: 171/91 176/88  Pulse:  71  Temp: 36.9 C   Resp: 17 14    Last Pain:  Filed Vitals:   11/27/15 0939  PainSc: 0-No pain                 Dorann Davidson EDWARD

## 2015-11-28 DIAGNOSIS — E876 Hypokalemia: Secondary | ICD-10-CM

## 2015-11-28 LAB — BASIC METABOLIC PANEL
ANION GAP: 8 (ref 5–15)
BUN: <5 mg/dL — ABNORMAL LOW (ref 6–20)
CALCIUM: 8.1 mg/dL — AB (ref 8.9–10.3)
CO2: 27 mmol/L (ref 22–32)
CREATININE: 0.99 mg/dL (ref 0.61–1.24)
Chloride: 102 mmol/L (ref 101–111)
GLUCOSE: 105 mg/dL — AB (ref 65–99)
Potassium: 3.3 mmol/L — ABNORMAL LOW (ref 3.5–5.1)
Sodium: 137 mmol/L (ref 135–145)

## 2015-11-28 LAB — LIPID PANEL
CHOL/HDL RATIO: 2.8 ratio
CHOLESTEROL: 117 mg/dL (ref 0–200)
HDL: 42 mg/dL (ref >40)
LDL CALC: 60 mg/dL (ref 0–99)
Triglycerides: 73 mg/dL (ref <150)
VLDL: 15 mg/dL (ref 0–40)

## 2015-11-28 LAB — HEMOGLOBIN AND HEMATOCRIT, BLOOD
HEMATOCRIT: 37.2 % — AB (ref 39.0–52.0)
HEMOGLOBIN: 12.6 g/dL — AB (ref 13.0–17.0)

## 2015-11-28 MED ORDER — CIPROFLOXACIN HCL 500 MG PO TABS
500.0000 mg | ORAL_TABLET | Freq: Two times a day (BID) | ORAL | Status: DC
  Administered 2015-11-28 – 2015-12-01 (×7): 500 mg via ORAL
  Filled 2015-11-28 (×8): qty 1

## 2015-11-28 MED ORDER — MAGNESIUM OXIDE 400 (241.3 MG) MG PO TABS
400.0000 mg | ORAL_TABLET | Freq: Two times a day (BID) | ORAL | Status: AC
  Administered 2015-11-28 (×2): 400 mg via ORAL
  Filled 2015-11-28 (×2): qty 1

## 2015-11-28 MED ORDER — POTASSIUM CHLORIDE CRYS ER 20 MEQ PO TBCR
20.0000 meq | EXTENDED_RELEASE_TABLET | Freq: Every day | ORAL | Status: DC
  Administered 2015-11-28 – 2015-12-01 (×4): 20 meq via ORAL
  Filled 2015-11-28 (×6): qty 1

## 2015-11-28 NOTE — Progress Notes (Signed)
Physical Therapy Treatment Patient Details Name: Faraz Pittsenbarger MRN: [id number] DOB: Jan 17, 1946 Today's Date: 2015-12-03    History of Present Illness 70 y.o. gentleman with a history of cirrhosis (self-reported), daily EtOH use, active tobacco use, PVD, and HTN who presented to the ED after multiple episodes of syncope. Syncopal episode on 11-21-15 resulted in head trauma and laceration requiring staple in the ED.    PT Comments    Pt making good progress. Needs encouragement to initiate mobility.  Follow Up Recommendations  No PT follow up;Supervision for mobility/OOB     Equipment Recommendations  Rolling walker with 5" wheels    Recommendations for Other Services       Precautions / Restrictions Precautions Precautions: Fall Restrictions Weight Bearing Restrictions: No    Mobility  Bed Mobility Overal bed mobility: Modified Independent Bed Mobility: Supine to Sit     Supine to sit: Modified independent (Device/Increase time);HOB elevated     General bed mobility comments: HOB elevated; increased time  Transfers Overall transfer level: Needs assistance Equipment used: None Transfers: Sit to/from Stand Sit to Stand: Supervision         General transfer comment: supervision for safety. Had pt practice standing without use of UE's in preparation for possible CABG. Pt able to perform with supervision.  Ambulation/Gait Ambulation/Gait assistance: Min guard Ambulation Distance (Feet): 450 Feet Assistive device: 4-wheeled walker;None Gait Pattern/deviations: Step-through pattern;Decreased stride length     General Gait Details: Unsteady without assistive device but no overt loss of balance.   Stairs            Wheelchair Mobility    Modified Rankin (Stroke Patients Only)       Balance Overall balance assessment: Needs assistance Sitting-balance support: No upper extremity supported;Feet supported Sitting balance-Leahy Scale: Good     Standing  balance support: No upper extremity supported;During functional activity Standing balance-Leahy Scale: Fair                      Cognition Arousal/Alertness: Awake/alert Behavior During Therapy: Flat affect Overall Cognitive Status: Within Functional Limits for tasks assessed                      Exercises      General Comments        Pertinent Vitals/Pain Pain Assessment: No/denies pain    Home Living                      Prior Function            PT Goals (current goals can now be found in the care plan section) Progress towards PT goals: Progressing toward goals    Frequency  Min 3X/week    PT Plan Current plan remains appropriate    Co-evaluation             End of Session Equipment Utilized During Treatment: Gait belt Activity Tolerance: Patient tolerated treatment well Patient left: in bed;with family/visitor present (pt sitting EOB)     Time: 5366-4403 PT Time Calculation (min) (ACUTE ONLY): 12 min  Charges:  $Gait Training: 8-22 mins                    G Codes:      Nohely Whitehorn 12-03-2015, 5:15 PM [employer] PT [phone]

## 2015-11-28 NOTE — Progress Notes (Signed)
Progress Note    Derrick Ramos  JYN:[id number] DOB: [birth year]  DOA: 11/21/2015 PCP: Pcp Not In System    Brief Narrative:   Derrick Ramos is an 70 y.o. male gentleman with a history of cirrhosis (self-reported), daily EtOH use, active tobacco use, PVD, and HTN who presents to the ED accompanied by his wife and daughter who has had recurrent syncopal episodes. Cardiology consulted and he underwent cardiac cath on 7/7 showing LAD disease. CVT surgery consulted and recommendations given.  7/10 EGD.  Assessment/Plan:   Principal Problem:   Syncope and collapse Active Problems:   Atrial fibrillation (HCC)   Hypokalemia   HTN (hypertension)   Hepatic cirrhosis (HCC)   Hematemesis-endo 11/27/15   AKI (acute kidney injury) (HCC)   Laceration of head   Tobacco use disorder   Alcohol abuse   Malnutrition of moderate degree   CAD- 80% Ca++ proximal LAD   Atherosclerotic PVD with intermittent claudication (HCC)  Syncope possibly from ETOH abuse/ arrhythmias/ orthostatic/: ? Coffee ground emesis.  GI consulted for further eval, obtained old records from one year ago. He underwent EGD showing an antral ulcer and antral erosions. Colonoscopy shows tubular adenoma in the rectum and descending colon which were removed. Recommended to continue with BID PPI.  Plan for EGD on 7/10  Anemia of blood loss/ GI bleed:  Hemoglobin on admission was 15.8 and today its 12.5. Will get anemia panel, stool for occult blood positive.  Assuming he was dehydrated on admission, there is a 3 gm drop in hemoglobin,.  Continue to monitor cbc. Repeat hemoglobin in am.  Transfuse to keep hemoglobin greater than 8.  GI consulted and recommended to get EGD done,scheduled for 7/10.  EGD done on 7/10 showed normal esophagus, gastritis, antral erosions, erythematous duodenopathy .      New onset / Paroxysmal atrial fibrillation: Echo shows Systolic function was mildly to moderately reduced. The estimated  ejection fraction was  in the range of 40% to 45%. There is severe hypokinesis of the entire inferolateral myocardium. There is akinesis of the  basalinferior myocardium. The study was not technically sufficient to allow evaluation of LV diastolic dysfunction due to  atrial fibrillation. [place] score is 3.  But because of his ETOH abuse, not a good candidate for anticoagulation.  Cardiology consulted, underwent cath shows 80% LAD stenosis, circumflex and irght coronary are non obstructive.   Cardiothoracic surgery consulted , recommended EGD prior to CABG,.  EGD done on 7/10 showing gastritis and some antral erosions, no frank bleeding ulcers.  Await cardiothoracic surgery recommendations on surgery.  Appreciate EP recommendations.  Discussed with the patient.     Aki: Improved with gentle hydration. Stopped IV fluids.  Recheck renal parameters are within normal limits.    Hypertension:  Not well controlled.  PRN hydralazine and on amlodipine.   Liver Cirrhosis: Liver function panel wnl.    ETOH abuse: On CIWA protocol.  No signs of withdrawal.   Left rib pain: Rib series ordered and negative for fracture.  Pain control .  Avoid NSAIDS.   Abnormal UA:  Urine cultures not sent on admission. He is asymptomatic. Denies any dysuria.  Continue to monitor. IV cipro started by CVTS. Completed 4 days of treatment.   Fall and scalp wound: No bleeding.     Family Communication/Anticipated D/C date and plan/Code Status   DVT prophylaxis: sq heparin.  Code Status: Full Code.  Family Communication: none at bedside.  Disposition Plan: pending further investigation.  Medical Consultants:    Gastroenterology.   Cardiology  EP  TCTS.   Procedures:  echocardiogram Cardiac catheterization.  Anti-Infectives:   Anti-infectives    Start     Dose/Rate Route Frequency Ordered Stop   11/28/15 1100  ciprofloxacin (CIPRO) tablet 500 mg     500 mg Oral 2 times daily  11/28/15 0956     11/24/15 1400  ciprofloxacin (CIPRO) IVPB 400 mg  Status:  Discontinued     400 mg 200 mL/hr over 60 Minutes Intravenous Every 12 hours 11/24/15 1330 11/28/15 0956      Subjective:    Derrick Ramos  denies any chest pain or sob. No new complaints.   Objective:    Filed Vitals:   11/27/15 1414 11/27/15 2021 11/28/15 0446 11/28/15 1527  BP: 137/73 133/68 148/80 123/73  Pulse: 63 64 71 74  Temp: 98.4 F (36.9 C) 98.3 F (36.8 C) 98.7 F (37.1 C) 98.4 F (36.9 C)  TempSrc: Oral Oral Oral Oral  Resp: Height:      Weight:   59.376 kg (130 lb 14.4 oz)   SpO2: 98%  100% 100%    Intake/Output Summary (Last 24 hours) at 11/28/15 1707 Last data filed at 11/28/15 0234  Gross per 24 hour  Intake      0 ml  Output    450 ml  Net   -450 ml   Filed Weights   11/27/15 0416 11/27/15 0842 11/28/15 0446  Weight: 58.469 kg (128 lb 14.4 oz) 58.06 kg (128 lb) 59.376 kg (130 lb 14.4 oz)    Exam: General exam: Appears calm and comfortable.  Respiratory system: Clear to auscultation. Respiratory effort normal. Cardiovascular system: S1 & S2 heard,  No JVD,  rubs, gallops or clicks. No murmurs. Gastrointestinal system:tender abdomen, soft, non distended. No pain in the rib area. Central nervous system: Alert and oriented. No focal neurological deficits. Extremities: No clubbing, edema, or cyanosis. Skin: No rashes, lesions or ulcers .   Data Reviewed:   I have personally reviewed following labs and imaging studies:  Labs: Basic Metabolic Panel:  Recent Labs Lab 11/22/15 0537 11/23/15 1600 11/24/15 1113 11/25/15 0709  11/27/15 0709 11/27/15 0843 11/28/15 1106  NA 139 137  --  139  --  135 139 137  K 4.6 4.1  --  3.4*  < > 2.8* 3.5 3.3*  CL 105 106  --  107  --  101  --  102  CO2 26 25  --  26  --  24  --  27  GLUCOSE 84 109*  --  97  --  97 92 105*  BUN 9 <5*  --  <5*  --  <5*  --  <5*  CREATININE 1.21 0.95 0.99 0.95  --  0.85  --  0.99    CALCIUM 8.9 8.7*  --  8.0*  --  8.2*  --  8.1*  < > = values in this interval not displayed. GFR Estimated Creatinine Clearance: 57.5 mL/min (by C-G formula based on Cr of 0.99). Liver Function Tests:  Recent Labs Lab 11/21/15 1818 11/22/15 0537 11/25/15 0709  AST 41 31 17  ALT 26 23 13*  ALKPHOS 100 95 71  BILITOT 1.6* 1.7* 0.8  PROT 7.1 6.5 5.5*  ALBUMIN 3.4* 3.0* 2.6*   No results for input(s): LIPASE, AMYLASE in the last 168 hours.  Recent Labs Lab 11/25/15 0709  AMMONIA 17   Coagulation profile  Recent  Labs Lab 11/21/15 1831 11/23/15 1029 11/25/15 0709  INR 1.15 1.00 1.00    CBC:  Recent Labs Lab 11/22/15 0537  11/24/15 1113  11/25/15 0709  11/26/15 1940 11/27/15 0709 11/27/15 0843 11/27/15 1847 11/28/15 0630  WBC 10.4  --  7.9  --  7.4  --   --   --   --   --   --   HGB 14.5  < > 13.4  < > 12.7*  < > 14.2 12.8* 13.6 12.7* 12.6*  HCT 43.4  < > 40.1  < > 38.8*  < > 41.4 38.0* 40.0 37.3* 37.2*  MCV 97.3  --  97.1  --  96.8  --   --   --   --   --   --   PLT 196  --  161  --  160  166  --   --   --   --   --   --   < > = values in this interval not displayed. Cardiac Enzymes:  Recent Labs Lab 11/21/15 1818 11/21/15 2331 11/22/15 0537  TROPONINI 0.03* 0.03* 0.04*   BNP (last 3 results) No results for input(s): PROBNP in the last 8760 hours. CBG: No results for input(s): GLUCAP in the last 168 hours. D-Dimer: No results for input(s): DDIMER in the last 72 hours. Hgb A1c: No results for input(s): HGBA1C in the last 72 hours. Lipid Profile:  Recent Labs  11/28/15 1106  CHOL 117  HDL 42  LDLCALC 60  TRIG 73  CHOLHDL 2.8   Thyroid function studies: No results for input(s): TSH, T4TOTAL, T3FREE, THYROIDAB in the last 72 hours.  Invalid input(s): FREET3 Anemia work up: No results for input(s): VITAMINB12, FOLATE, FERRITIN, TIBC, IRON, RETICCTPCT in the last 72 hours. Sepsis Labs:  Recent Labs Lab 11/22/15 0537 11/24/15 1113  11/25/15 0709  WBC 10.4 7.9 7.4   Urine analysis:    Component Value Date/Time   COLORURINE ORANGE* 11/21/2015 2148   APPEARANCEUR TURBID* 11/21/2015 2148   LABSPEC 1.026 11/21/2015 2148   PHURINE 5.0 11/21/2015 2148   GLUCOSEU 100* 11/21/2015 2148   HGBUR NEGATIVE 11/21/2015 2148   BILIRUBINUR MODERATE* 11/21/2015 2148   KETONESUR 15* 11/21/2015 2148   PROTEINUR 100* 11/21/2015 2148   NITRITE POSITIVE* 11/21/2015 2148   LEUKOCYTESUR SMALL* 11/21/2015 2148   Microbiology Recent Results (from the past 240 hour(s))  Surgical pcr screen     Status: None   Collection Time: 11/26/15 10:02 PM  Result Value Ref Range Status   MRSA, PCR NEGATIVE NEGATIVE Final   Staphylococcus aureus NEGATIVE NEGATIVE Final    Comment:        The Xpert SA Assay (FDA approved for NASAL specimens in patients over 23 years of age), is one component of a comprehensive surveillance program.  Test performance has been validated by [facility] for patients greater than or equal to 7 year old. It is not intended to diagnose infection nor to guide or monitor treatment.     Radiology: No results found.  Medications:   . amLODipine  10 mg Oral Daily  . aspirin EC  81 mg Oral Daily  . atorvastatin  80 mg Oral q1800  . ciprofloxacin  500 mg Oral BID  . feeding supplement  1 Container Oral TID BM  . folic acid  1 mg Oral Daily  . heparin  5,000 Units Subcutaneous Q8H  . magnesium oxide  400 mg Oral BID  . metoprolol tartrate  75 mg Oral BID  . multivitamin with minerals  1 tablet Oral Daily  . pantoprazole  40 mg Oral BID  . potassium chloride  20 mEq Oral Daily  . sodium chloride flush  3 mL Intravenous Q12H  . thiamine  100 mg Oral Daily   Continuous Infusions: . lactated ringers 10 mL/hr at 11/27/15 0855    Time spent: 30 minutes     Teran Knittle  [facility] [phone].  *Please refer to [email or web address], password [email or web address] to get updated schedule on who will round on this patient, as  hospitalists switch teams weekly. If 7PM-7AM, please contact night-coverage at [email or web address], password [email or web address] for any overnight needs.  11/28/2015, 5:07 PM

## 2015-11-28 NOTE — Progress Notes (Signed)
PHARMACIST - PHYSICIAN COMMUNICATION  CONCERNING: Antibiotic IV to Oral Route Change Policy  RECOMMENDATION: This patient is receiving Cipro by the intravenous route.  Based on criteria approved by the Pharmacy and Therapeutics Committee, the antibiotic(s) is/are being converted to the equivalent oral dose form(s).   DESCRIPTION: These criteria include:  Patient being treated for a respiratory tract infection, urinary tract infection, cellulitis or clostridium difficile associated diarrhea if on metronidazole  The patient is not neutropenic and does not exhibit a GI malabsorption state  The patient is eating (either orally or via tube) and/or has been taking other orally administered medications for a least 24 hours  The patient is improving clinically and has a Tmax < 100.5  If you have questions about this conversion, please contact the Pharmacy Department  []   [phone] )  [facility] []   [phone] )  [facility] [x]   [phone] )  [facility] []   [phone] )  [facility] []   [phone] )  [facility]   Thank you, Harland German, [place] D 11/28/2015 9:56 AM

## 2015-11-28 NOTE — Progress Notes (Signed)
Subjective:  No angina, pt's main complaint is pain Lt ribs from his fall on admission- (rib films were negative for fracture)  Objective:  Vital Signs in the last 24 hours: Temp:  [98.3 F (36.8 C)-98.7 F (37.1 C)] 98.7 F (37.1 C) (07/11 0446) Pulse Rate:  [63-72] 71 (07/11 0446) Resp:  [13-18] 18 (07/11 0446) BP: (133-189)/(68-97) 148/80 mmHg (07/11 0446) SpO2:  [98 %-100 %] 100 % (07/11 0446) Weight:  [130 lb 14.4 oz (59.376 kg)] 130 lb 14.4 oz (59.376 kg) (07/11 0446)  Intake/Output from previous day:  Intake/Output Summary (Last 24 hours) at 11/28/15 0933 Last data filed at 11/28/15 0234  Gross per 24 hour  Intake    360 ml  Output   1000 ml  Net   -640 ml    Physical Exam: General appearance: alert, cooperative and no distress Neck: no carotid bruit and no JVD Lungs: clear to auscultation bilaterally Heart: regular rate and rhythm Extremities: no edema Skin: Skin color, texture, turgor normal. No rashes or lesions Neurologic: Grossly normal   Rate: 70  Rhythm: normal sinus rhythm with PVCs and couplets  Lab Results:  Recent Labs  11/27/15 1847 11/28/15 0630  HGB 12.7* 12.6*    Recent Labs  11/27/15 0709 11/27/15 0843  NA 135 139  K 2.8* 3.5  CL 101  --   CO2 24  --   GLUCOSE 97 92  BUN <5*  --   CREATININE 0.85  --    No results for input(s): TROPONINI in the last 72 hours.  Invalid input(s): CK, MB No results for input(s): INR in the last 72 hours.  Scheduled Meds: . amLODipine  10 mg Oral Daily  . aspirin EC  81 mg Oral Daily  . atorvastatin  80 mg Oral q1800  . ciprofloxacin  400 mg Intravenous Q12H  . feeding supplement  1 Container Oral TID BM  . folic acid  1 mg Oral Daily  . heparin  5,000 Units Subcutaneous Q8H  . magnesium oxide  400 mg Oral BID  . metoprolol tartrate  75 mg Oral BID  . multivitamin with minerals  1 tablet Oral Daily  . pantoprazole  40 mg Oral BID  . potassium chloride  20 mEq Oral Daily  . sodium  chloride flush  3 mL Intravenous Q12H  . thiamine  100 mg Oral Daily   Continuous Infusions: . lactated ringers 10 mL/hr at 11/27/15 0855   PRN Meds:.sodium chloride, acetaminophen, albuterol, hydrALAZINE, nitroGLYCERIN, ondansetron (ZOFRAN) IV, oxyCODONE-acetaminophen, sodium chloride flush   Imaging: Imaging results have been reviewed  Cardiac Studies:  Assessment/Plan:  70 y.o.AA male with a history of HTN, self reported cirrhosis, daily alcohol use, tobacco abuse and PAD who was brought by EMS 11/21/15 after syncope episode x 2. EKG on admission showed AF with CVR, he has since converted to NSR. There apparently was a history of hematemasis but his Hgb has been stable. Cath done this admission showed Ca++ 80% proximal LAD. Endoscopy done 11/27/15- showed no obvious ulcers (moderate inflammation).    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  V ectopy noted- K+ has been low- will add supplement and check Mg++ level.   [name] PA-C 11/28/2015, 9:33 AM [phone]  I have seen and examined the patient along with [name] PA-C.  I have reviewed the chart, notes and new data.  I agree with PA/NP's note.  Key new complaints: no angina, has some rib pain from his fall Key examination changes: occasional ectopy, (PVCs on monitor), no overt HF Key new findings / data: electrolytes improved  PLAN: Awaiting surgical recommendation re: LIMA to LAD. High grade calcified ostial LAD stenosis, not optimal for PCI. Check lipid profile. Continue beta  blocker  Thurmon Fair, MD, [facility] [phone] 11/28/2015, 9:57 AM

## 2015-11-28 NOTE — Progress Notes (Signed)
1 Day Post-Op Procedure(s) (LRB): ESOPHAGOGASTRODUODENOSCOPY (EGD) WITH PROPOFOL (N/A) Subjective: Patient found by GI to have no evidence of active bleeding and is cleared for chronic Plavix use  Patient with single vessel CAD. I believe his alcoholic cirrhosis would preclude a successful outcome after CABG for single-vessel disease and would recommend medical therapy versus PCI. I do not feel that he is a candidate for general anesthesia and cardiac surgery based for single-vessel CAD based on his cirrhosis and  extremely heavy alcohol history.  Objective: Vital signs in last 24 hours: Temp:  [98.3 F (36.8 C)-98.7 F (37.1 C)] 98.4 F (36.9 C) (07/11 1527) Pulse Rate:  [64-74] 74 (07/11 1527) Cardiac Rhythm:  [-] Normal sinus rhythm (07/11 0804) Resp:  [16-18] 18 (07/11 1527) BP: (123-148)/(68-80) 123/73 mmHg (07/11 1527) SpO2:  [100 %] 100 % (07/11 1527) Weight:  [130 lb 14.4 oz (59.376 kg)] 130 lb 14.4 oz (59.376 kg) (07/11 0446)  Hemodynamic parameters for last 24 hours:    Intake/Output from previous day: 07/10 0701 - 07/11 0700 In: 660 [P.O.:360; I.V.:300] Out: 1000 [Urine:1000] Intake/Output this shift:      Lab Results:  Recent Labs  11/27/15 1847 11/28/15 0630  HGB 12.7* 12.6*  HCT 37.3* 37.2*   BMET:  Recent Labs  11/27/15 0709 11/27/15 0843 11/28/15 1106  NA 135 139 137  K 2.8* 3.5 3.3*  CL 101  --  102  CO2 24  --  27  GLUCOSE 97 92 105*  BUN <5*  --  <5*  CREATININE 0.85  --  0.99  CALCIUM 8.2*  --  8.1*    PT/INR: No results for input(s): LABPROT, INR in the last 72 hours. ABG    Component Value Date/Time   PHART 7.449 11/25/2015 0940   HCO3 23.6 11/25/2015 0940   TCO2 24.7 11/25/2015 0940   O2SAT 95.7 11/25/2015 0940   CBG (last 3)  No results for input(s): GLUCAP in the last 72 hours.  Assessment/Plan: S/P Procedure(s) (LRB): ESOPHAGOGASTRODUODENOSCOPY (EGD) WITH PROPOFOL (N/A) Not surgical candidate for CABG because of  comorbid medical disease      Kathlee Nations Trigt III 11/28/2015

## 2015-11-29 ENCOUNTER — Inpatient Hospital Stay (HOSPITAL_COMMUNITY): Payer: Medicare Other

## 2015-11-29 ENCOUNTER — Encounter (HOSPITAL_COMMUNITY)
Admission: EM | Disposition: A | Discharge status: Home / Self Care | Payer: Self-pay | Source: Home / Self Care | Attending: Internal Medicine

## 2015-11-29 ENCOUNTER — Encounter (HOSPITAL_COMMUNITY): Payer: Self-pay | Admitting: Cardiovascular Disease

## 2015-11-29 DIAGNOSIS — I5021 Acute systolic (congestive) heart failure: Secondary | ICD-10-CM | POA: Diagnosis present

## 2015-11-29 DIAGNOSIS — K3189 Other diseases of stomach and duodenum: Secondary | ICD-10-CM | POA: Diagnosis present

## 2015-11-29 DIAGNOSIS — R110 Nausea: Secondary | ICD-10-CM

## 2015-11-29 DIAGNOSIS — K7030 Alcoholic cirrhosis of liver without ascites: Secondary | ICD-10-CM | POA: Diagnosis present

## 2015-11-29 DIAGNOSIS — I70213 Atherosclerosis of native arteries of extremities with intermittent claudication, bilateral legs: Secondary | ICD-10-CM | POA: Diagnosis present

## 2015-11-29 DIAGNOSIS — F1010 Alcohol abuse, uncomplicated: Secondary | ICD-10-CM

## 2015-11-29 DIAGNOSIS — I110 Hypertensive heart disease with heart failure: Secondary | ICD-10-CM | POA: Diagnosis present

## 2015-11-29 DIAGNOSIS — F17210 Nicotine dependence, cigarettes, uncomplicated: Secondary | ICD-10-CM | POA: Diagnosis present

## 2015-11-29 DIAGNOSIS — R609 Edema, unspecified: Secondary | ICD-10-CM | POA: Diagnosis not present

## 2015-11-29 DIAGNOSIS — I480 Paroxysmal atrial fibrillation: Secondary | ICD-10-CM | POA: Diagnosis present

## 2015-11-29 DIAGNOSIS — R55 Syncope and collapse: Secondary | ICD-10-CM | POA: Insufficient documentation

## 2015-11-29 DIAGNOSIS — W1830XA Fall on same level, unspecified, initial encounter: Secondary | ICD-10-CM | POA: Diagnosis present

## 2015-11-29 DIAGNOSIS — N179 Acute kidney failure, unspecified: Secondary | ICD-10-CM | POA: Diagnosis present

## 2015-11-29 DIAGNOSIS — I739 Peripheral vascular disease, unspecified: Secondary | ICD-10-CM | POA: Diagnosis not present

## 2015-11-29 DIAGNOSIS — Z6820 Body mass index (BMI) 20.0-20.9, adult: Secondary | ICD-10-CM | POA: Diagnosis not present

## 2015-11-29 DIAGNOSIS — I472 Ventricular tachycardia: Secondary | ICD-10-CM | POA: Diagnosis not present

## 2015-11-29 DIAGNOSIS — E440 Moderate protein-calorie malnutrition: Secondary | ICD-10-CM | POA: Diagnosis present

## 2015-11-29 DIAGNOSIS — D500 Iron deficiency anemia secondary to blood loss (chronic): Secondary | ICD-10-CM | POA: Diagnosis present

## 2015-11-29 DIAGNOSIS — I25110 Atherosclerotic heart disease of native coronary artery with unstable angina pectoris: Secondary | ICD-10-CM | POA: Diagnosis present

## 2015-11-29 DIAGNOSIS — K2970 Gastritis, unspecified, without bleeding: Secondary | ICD-10-CM | POA: Diagnosis present

## 2015-11-29 DIAGNOSIS — S0191XA Laceration without foreign body of unspecified part of head, initial encounter: Secondary | ICD-10-CM

## 2015-11-29 DIAGNOSIS — E860 Dehydration: Secondary | ICD-10-CM | POA: Diagnosis present

## 2015-11-29 DIAGNOSIS — Z7982 Long term (current) use of aspirin: Secondary | ICD-10-CM | POA: Diagnosis not present

## 2015-11-29 DIAGNOSIS — K219 Gastro-esophageal reflux disease without esophagitis: Secondary | ICD-10-CM | POA: Diagnosis present

## 2015-11-29 DIAGNOSIS — E876 Hypokalemia: Secondary | ICD-10-CM | POA: Diagnosis present

## 2015-11-29 DIAGNOSIS — S0101XA Laceration without foreign body of scalp, initial encounter: Secondary | ICD-10-CM | POA: Diagnosis present

## 2015-11-29 DIAGNOSIS — K920 Hematemesis: Secondary | ICD-10-CM

## 2015-11-29 DIAGNOSIS — I255 Ischemic cardiomyopathy: Secondary | ICD-10-CM | POA: Diagnosis present

## 2015-11-29 DIAGNOSIS — I2510 Atherosclerotic heart disease of native coronary artery without angina pectoris: Secondary | ICD-10-CM | POA: Diagnosis not present

## 2015-11-29 HISTORY — PX: CARDIAC CATHETERIZATION: SHX172

## 2015-11-29 LAB — BASIC METABOLIC PANEL
Anion gap: 8 (ref 5–15)
BUN: 5 mg/dL — ABNORMAL LOW (ref 6–20)
CO2: 27 mmol/L (ref 22–32)
Calcium: 8 mg/dL — ABNORMAL LOW (ref 8.9–10.3)
Chloride: 103 mmol/L (ref 101–111)
Creatinine, Ser: 1.04 mg/dL (ref 0.61–1.24)
GFR calc Af Amer: >60 mL/min (ref >60)
GFR calc non Af Amer: >60 mL/min (ref >60)
Glucose, Bld: 93 mg/dL (ref 65–99)
Potassium: 3.3 mmol/L — ABNORMAL LOW (ref 3.5–5.1)
Sodium: 138 mmol/L (ref 135–145)

## 2015-11-29 LAB — CBC
HEMATOCRIT: 36.9 % — AB (ref 39.0–52.0)
HEMOGLOBIN: 12.1 g/dL — AB (ref 13.0–17.0)
MCH: 31.5 pg (ref 26.0–34.0)
MCHC: 32.8 g/dL (ref 30.0–36.0)
MCV: 96.1 fL (ref 78.0–100.0)
Platelets: 282 10*3/uL (ref 150–400)
RBC: 3.84 MIL/uL — ABNORMAL LOW (ref 4.22–5.81)
RDW: 12.9 % (ref 11.5–15.5)
WBC: 10.7 10*3/uL — ABNORMAL HIGH (ref 4.0–10.5)

## 2015-11-29 LAB — MAGNESIUM: Magnesium: 0.8 mg/dL — CL (ref 1.7–2.4)

## 2015-11-29 LAB — POCT ACTIVATED CLOTTING TIME: Activated Clotting Time: 439 seconds

## 2015-11-29 SURGERY — CORONARY STENT INTERVENTION

## 2015-11-29 MED ORDER — NITROGLYCERIN 1 MG/10 ML FOR IR/CATH LAB
INTRA_ARTERIAL | Status: DC | PRN
  Administered 2015-11-29: 200 ug via INTRACORONARY

## 2015-11-29 MED ORDER — ATORVASTATIN CALCIUM 20 MG PO TABS
20.0000 mg | ORAL_TABLET | Freq: Every day | ORAL | Status: DC
  Administered 2015-11-29 – 2015-11-30 (×2): 20 mg via ORAL
  Filled 2015-11-29 (×2): qty 1

## 2015-11-29 MED ORDER — TICAGRELOR 90 MG PO TABS
90.0000 mg | ORAL_TABLET | Freq: Two times a day (BID) | ORAL | Status: DC
  Administered 2015-11-30 – 2015-12-01 (×4): 90 mg via ORAL
  Filled 2015-11-29 (×4): qty 1

## 2015-11-29 MED ORDER — ANGIOPLASTY BOOK
Freq: Once | Status: AC
  Administered 2015-11-29: 21:00:00
  Filled 2015-11-29: qty 1

## 2015-11-29 MED ORDER — HEPARIN SODIUM (PORCINE) 1000 UNIT/ML IJ SOLN
INTRAMUSCULAR | Status: DC | PRN
  Administered 2015-11-29: 10000 [IU] via INTRAVENOUS

## 2015-11-29 MED ORDER — MIDAZOLAM HCL 2 MG/2ML IJ SOLN
INTRAMUSCULAR | Status: AC
  Filled 2015-11-29: qty 2

## 2015-11-29 MED ORDER — IOPAMIDOL (ISOVUE-370) INJECTION 76%
INTRAVENOUS | Status: AC
  Filled 2015-11-29: qty 100

## 2015-11-29 MED ORDER — SODIUM CHLORIDE 0.9% FLUSH: 3.0000 mL | INTRAVENOUS | Status: DC | PRN

## 2015-11-29 MED ORDER — IOPAMIDOL (ISOVUE-370) INJECTION 76%
INTRAVENOUS | Status: DC | PRN
  Administered 2015-11-29: 130 mL via INTRA_ARTERIAL

## 2015-11-29 MED ORDER — FENTANYL CITRATE (PF) 100 MCG/2ML IJ SOLN
INTRAMUSCULAR | Status: AC
  Filled 2015-11-29: qty 2

## 2015-11-29 MED ORDER — NITROGLYCERIN 1 MG/10 ML FOR IR/CATH LAB
INTRA_ARTERIAL | Status: AC
  Filled 2015-11-29: qty 10

## 2015-11-29 MED ORDER — HEPARIN (PORCINE) IN NACL 2-0.9 UNIT/ML-% IJ SOLN
INTRAMUSCULAR | Status: DC | PRN
  Administered 2015-11-29: 1500 mL

## 2015-11-29 MED ORDER — POTASSIUM CHLORIDE CRYS ER 20 MEQ PO TBCR
20.0000 meq | EXTENDED_RELEASE_TABLET | Freq: Once | ORAL | Status: AC
  Administered 2015-11-29: 20 meq via ORAL
  Filled 2015-11-29: qty 1

## 2015-11-29 MED ORDER — SODIUM CHLORIDE 0.9% FLUSH
3.0000 mL | Freq: Two times a day (BID) | INTRAVENOUS | Status: DC
  Administered 2015-11-30 – 2015-12-01 (×3): 3 mL via INTRAVENOUS

## 2015-11-29 MED ORDER — LIDOCAINE HCL (PF) 1 % IJ SOLN
INTRAMUSCULAR | Status: DC | PRN
  Administered 2015-11-29: 2 mL via SUBCUTANEOUS

## 2015-11-29 MED ORDER — HEPARIN SODIUM (PORCINE) 1000 UNIT/ML IJ SOLN
INTRAMUSCULAR | Status: AC
  Filled 2015-11-29: qty 1

## 2015-11-29 MED ORDER — VERAPAMIL HCL 2.5 MG/ML IV SOLN
INTRAVENOUS | Status: DC | PRN
  Administered 2015-11-29: 10 mL via INTRA_ARTERIAL

## 2015-11-29 MED ORDER — HEPARIN (PORCINE) IN NACL 2-0.9 UNIT/ML-% IJ SOLN
INTRAMUSCULAR | Status: AC
Start: 2015-11-29 — End: 2015-11-29
  Filled 2015-11-29: qty 1500

## 2015-11-29 MED ORDER — POTASSIUM CHLORIDE CRYS ER 20 MEQ PO TBCR
40.0000 meq | EXTENDED_RELEASE_TABLET | Freq: Once | ORAL | Status: AC
  Administered 2015-11-29: 40 meq via ORAL
  Filled 2015-11-29: qty 2

## 2015-11-29 MED ORDER — SODIUM CHLORIDE 0.9 % IV SOLN: INTRAVENOUS | Status: AC

## 2015-11-29 MED ORDER — LIDOCAINE HCL (PF) 1 % IJ SOLN
INTRAMUSCULAR | Status: AC
  Filled 2015-11-29: qty 30

## 2015-11-29 MED ORDER — TICAGRELOR 90 MG PO TABS
180.0000 mg | ORAL_TABLET | Freq: Once | ORAL | Status: AC
  Administered 2015-11-29: 180 mg via ORAL
  Filled 2015-11-29: qty 2

## 2015-11-29 MED ORDER — MIDAZOLAM HCL 2 MG/2ML IJ SOLN
INTRAMUSCULAR | Status: DC | PRN
  Administered 2015-11-29: 1 mg via INTRAVENOUS

## 2015-11-29 MED ORDER — SODIUM CHLORIDE 0.9 % IV SOLN: 250.0000 mL | INTRAVENOUS | Status: DC | PRN

## 2015-11-29 MED ORDER — FENTANYL CITRATE (PF) 100 MCG/2ML IJ SOLN
INTRAMUSCULAR | Status: DC | PRN
  Administered 2015-11-29: 25 ug via INTRAVENOUS

## 2015-11-29 MED ORDER — MAGNESIUM SULFATE 4 GM/100ML IV SOLN
4.0000 g | Freq: Once | INTRAVENOUS | Status: AC
  Administered 2015-11-29: 4 g via INTRAVENOUS
  Filled 2015-11-29 (×2): qty 100

## 2015-11-29 SURGICAL SUPPLY — 16 items
BALLN EMERGE MR 2.5X12 (BALLOONS) ×3
BALLN ~~LOC~~ EUPHORA RX 4.5X12 (BALLOONS) ×3
BALLOON EMERGE MR 2.5X12 (BALLOONS) ×1 IMPLANT
BALLOON ~~LOC~~ EUPHORA RX 4.5X12 (BALLOONS) ×1 IMPLANT
CATH VISTA GUIDE 6FR XBLAD3.5 (CATHETERS) ×3 IMPLANT
DEVICE RAD COMP TR BAND LRG (VASCULAR PRODUCTS) ×3 IMPLANT
ELECT DEFIB PAD ADLT CADENCE (PAD) ×3 IMPLANT
GLIDESHEATH SLEND SS 6F .021 (SHEATH) ×3 IMPLANT
KIT ENCORE 26 ADVANTAGE (KITS) ×3 IMPLANT
KIT HEART LEFT (KITS) ×3 IMPLANT
PACK CARDIAC CATHETERIZATION (CUSTOM PROCEDURE TRAY) ×3 IMPLANT
STENT VISION RX 4.0X15 (Permanent Stent) ×3 IMPLANT
TRANSDUCER W/STOPCOCK (MISCELLANEOUS) ×3 IMPLANT
TUBING CIL FLEX 10 FLL-RA (TUBING) ×3 IMPLANT
WIRE COUGAR XT STRL 190CM (WIRE) ×3 IMPLANT
WIRE SAFE-T 1.5MM-J .035X260CM (WIRE) ×3 IMPLANT

## 2015-11-29 NOTE — Progress Notes (Signed)
*  Preliminary Results* Left upper extremity venous duplex completed. Left upper extremity is negative for deep and superficial vein thrombosis.  Incidental finding: there is a heterogenous avascular circumscribed area of the left axilla measuring 1.3cm; etiology unknown.  11/29/2015 6:40 PM  Gertie Fey, RVT, RDCS, RDMS

## 2015-11-29 NOTE — Progress Notes (Signed)
K.Kirby N.P. Text page abnl lab results. See orders. R.N. aware

## 2015-11-29 NOTE — Care Management Note (Signed)
Case Management Note  Patient Details  Name: Khael Lino MRN: [id number] Date of Birth: Oct 15, 1945  Subjective/Objective:                    Action/Plan: Pt to start on Brilinta. Benefits check submitted. CM will continue to follow for discharge needs.   Expected Discharge Date:                  Expected Discharge Plan:     In-House Referral:     Discharge planning Services     Post Acute Care Choice:    Choice offered to:     DME Arranged:    DME Agency:     HH Arranged:    HH Agency:     Status of Service:     If discussed at [employer], dates discussed:    Additional Comments:  Kermit Balo, RN 11/29/2015, 4:43 PM

## 2015-11-29 NOTE — Clinical Social Work Note (Signed)
Clinical Social Work Assessment  Patient Details  Name: Derrick Ramos MRN: [id number] Date of Birth: [birth year]  Date of referral:  11/29/15               Reason for consult:  Substance Use/ETOH Abuse                Permission sought to share information with:    Permission granted to share information::  No  Name::        Agency::     Relationship::     Contact Information:     Housing/Transportation Living arrangements for the past 2 months:  Apartment Source of Information:  Patient Patient Interpreter Needed:  None Criminal Activity/Legal Involvement Pertinent to Current Situation/Hospitalization:  No - Comment as needed Significant Relationships:  Adult Children, Spouse Lives with:  Spouse Do you feel safe going back to the place where you live?  Yes Need for family participation in patient care:  No (Coment)  Care giving concerns:  Patient reported he does not believe his alcohol use is a problem, but was willing to discuss his use. CSW also provided patient with a list outpatient and residential substance abuse providers.  Social Worker assessment / plan:  CSW received substance abuse consult. CSW met with patient at beside to complete assessment. Patient was resting comfortably in bed. CSW completed SBIRT with patient. Patient reported his support as his adult children. Patient was open to discussing his substance use and willing to review list outpatient and residential substance abuse providers.   Employment status:  Retired Nurse, adult PT Recommendations:  No Follow Up Information / Referral to community resources:  Residential Substance Abuse Treatment Options, SBIRT  Patient/Family's Response to care:  The patient appears happy with the care he is receiving in hospital and is appreciative of CSW assistance.  Patient/Family's Understanding of and Emotional Response to Diagnosis, Current Treatment, and Prognosis:  The patient has a good  understanding of why he was admitted. He understands the care plan and what he will need post discharge.  Emotional Assessment Appearance:  Appears older than stated age Attitude/Demeanor/Rapport:   (Patient was appropriate. ) Affect (typically observed):  Accepting, Appropriate, Calm Orientation:  Oriented to Self, Oriented to Place, Oriented to  Time, Oriented to Situation Alcohol / Substance use:  Alcohol Use Psych involvement (Current and /or in the community):  No (Comment)  Discharge Needs  Concerns to be addressed:  Substance Abuse Concerns Readmission within the last 30 days:  No Current discharge risk:  None Barriers to Discharge:  No Barriers Identified   Derrick Ramos A Derrick Ostenson, LCSW 11/29/2015, 10:50 AM

## 2015-11-29 NOTE — Progress Notes (Signed)
TR BAND REMOVAL  LOCATION:    right radial  DEFLATED PER PROTOCOL:    Yes.    TIME BAND OFF / DRESSING APPLIED:    1845   SITE UPON ARRIVAL:    Level 0  SITE AFTER BAND REMOVAL:    Level 0  CIRCULATION SENSATION AND MOVEMENT:    Within Normal Limits   Yes.    COMMENTS:   Tolerated procedure well 

## 2015-11-29 NOTE — H&P (View-Only) (Signed)
Patient Name: Derrick Ramos Date of Encounter: 11/29/2015  Principal Problem:   Syncope and collapse Active Problems:   Atrial fibrillation (HCC)   Hypokalemia   HTN (hypertension)   Hepatic cirrhosis (HCC)   Hematemesis-endo 11/27/15   AKI (acute kidney injury) (HCC)   Laceration of head   Tobacco use disorder   Alcohol abuse   Malnutrition of moderate degree   CAD- 80% Ca++ proximal LAD   Atherosclerotic PVD with intermittent claudication (HCC)   Length of Stay:   SUBJECTIVE  No angina overnight, denies dyspnea.   CURRENT MEDS . amLODipine  10 mg Oral Daily  . aspirin EC  81 mg Oral Daily  . atorvastatin  80 mg Oral q1800  . ciprofloxacin  500 mg Oral BID  . feeding supplement  1 Container Oral TID BM  . folic acid  1 mg Oral Daily  . heparin  5,000 Units Subcutaneous Q8H  . metoprolol tartrate  75 mg Oral BID  . multivitamin with minerals  1 tablet Oral Daily  . pantoprazole  40 mg Oral BID  . potassium chloride  20 mEq Oral Daily  . sodium chloride flush  3 mL Intravenous Q12H  . thiamine  100 mg Oral Daily    OBJECTIVE   Intake/Output Summary (Last 24 hours) at 11/29/15 0958 Last data filed at 11/29/15 0548  Gross per 24 hour  Intake    400 ml  Output    125 ml  Net    275 ml   Filed Weights   11/27/15 0842 11/28/15 0446 11/29/15 0425  Weight: 58.06 kg (128 lb) 59.376 kg (130 lb 14.4 oz) 59.149 kg (130 lb 6.4 oz)    PHYSICAL EXAM Filed Vitals:   11/28/15 0446 11/28/15 1527 11/28/15 2032 11/29/15 0425  BP: 148/80 123/73 142/67 140/67  Pulse: 71 74 68 72  Temp: 98.7 F (37.1 C) 98.4 F (36.9 C) 99.3 F (37.4 C) 99.1 F (37.3 C)  TempSrc: Oral Oral Oral Oral  Resp: 18 18 18 20   Height:      Weight: 59.376 kg (130 lb 14.4 oz)   59.149 kg (130 lb 6.4 oz)  SpO2: 100% 100% 100% 96%   General: Alert, oriented x3, no distress Head: no evidence of trauma, PERRL, EOMI, no exophtalmos or lid lag, no myxedema, no xanthelasma; normal ears, nose and  oropharynx Neck: normal jugular venous pulsations and no hepatojugular reflux; brisk carotid pulses without delay and no carotid bruits Chest: clear to auscultation, no signs of consolidation by percussion or palpation, normal fremitus, symmetrical and full respiratory excursions Cardiovascular: normal position and quality of the apical impulse, regular rhythm, normal first and second heart sounds, no rubs or gallops, no murmur Abdomen: no tenderness or distention, no masses by palpation, no abnormal pulsatility or arterial bruits, normal bowel sounds, no hepatosplenomegaly Extremities: no clubbing, cyanosis or edema; 2+ radial, ulnar and brachial pulses bilaterally; 2+ right femoral, posterior tibial and dorsalis pedis pulses; 2+ left femoral, posterior tibial and dorsalis pedis pulses; no subclavian or femoral bruits Neurological: grossly nonfocal  LABS  CBC  Recent Labs  11/28/15 0630 11/29/15 0327  WBC  --  10.7*  HGB 12.6* 12.1*  HCT 37.2* 36.9*  MCV  --  96.1  PLT  --  282   Basic Metabolic Panel  Recent Labs  11/28/15 1106 11/29/15 0327  NA 137 138  K 3.3* 3.3*  CL 102 103  CO2 27 27  GLUCOSE 105* 93  BUN <5* 5*  CREATININE 0.99 1.04  CALCIUM 8.1* 8.0*  MG  --  0.8*   Fasting Lipid Panel  Recent Labs  11/28/15 1106  CHOL 117  HDL 42  LDLCALC 60  TRIG 73  CHOLHDL 2.8   Thyroid Function Tests No results for input(s): TSH, T4TOTAL, T3FREE, THYROIDAB in the last 72 hours.  Invalid input(s): FREET3  Radiology Studies Imaging results have been reviewed and No results found.  TELE NSR  ASSESSMENT AND PLAN  1. CAD - Discussed CABG with Dr. Van Trigt - he believes that the risk of liver-related complications is high and reviewed the angiograms with Dr. McAlhany - he believes the proximal LAD can be stented, hopefully without rotablation. Discussed PCI-stent with Derrick Ramos and placed particular emphasis on the need for compliance with DAPT and the  potentially fatal risk of noncompliance and stent thrombosis. Also reviewed the increased risk of bleeding. Since the vessel is large in caliber (maybe 4.0 mm) and the lesion is short (maybe can be covered with a 12 mm stent), may be able to use a bare metal stent and shorten the necessary period of antiplatelet therapy.  This procedure has been fully reviewed with the patient and written informed consent has been obtained. LDL is not high, would reduce statin dose due to liver disease. This procedure has been fully reviewed with the patient and written informed consent has been obtained. 2. PAF - currently in sinus rhythm. This patients CHA2DS2-VASc Score and unadjusted Ischemic Stroke Rate (% per year) is equal to 4.8 % stroke rate/year from a score of 4, but the risk of full antithrombotic therapy is high, especially while on antiplatelet therapy. Would reassess anticoagulation after we stop DAPT. 3. 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  4. Syncope - plan event monitor at DC if no diagnostic arrhythmia while inpatient 5. PAD 6. Cirrhosis  7. Alcohol abuse 8. Tobacco abuse. Discussed stopping both for heart and liver health.     Lawrencia Mauney, MD, FACC [facility] [phone] office [phone] pager 11/29/2015 9:58 AM   

## 2015-11-29 NOTE — Progress Notes (Signed)
PROGRESS NOTE   Derrick Ramos  JWJ:[id number] DOB: [birth year] DOA: 11/21/2015 PCP: Pcp Not In System   Brief Narrative:  Derrick Ramos is an 70 y.o. male gentleman with a history of cirrhosis (self-reported), daily EtOH use, active tobacco use, PVD, and HTN who presents to the ED accompanied by his wife and daughter who has had recurrent syncopal episodes. Cardiology consulted and he underwent cardiac cath on 7/7 showing LAD disease. CVT surgery consulted and recommendations given.  7/10 EGD.  Assessment & Plan   Syncope -possibly from ETOH abuse/ arrhythmias/ orthostatics  -EP consulted and appreciated- recommended cardiac event monitor   Anemia of blood loss/ GI bleed/ Co  -Hemoglobin on admission was 15.8 and trended downward to 12.5.  -FOBT + -old records from one year ago. He underwent EGD showing an antral ulcer and antral erosions. Colonoscopy shows tubular adenoma in the rectum and descending colon which were removed. Recommended to continue with BID PPI.  -GI consulted and recommended to get EGD done, showing normal esophagus, gastritis, antral erosions, erythematous duodenopathy . -hemoglobin currently 12.1  New onset / Paroxysmal atrial fibrillation: -Echo shows Systolic function was mildly to moderately reduced. The estimated ejection fraction was in the range of 40% to 45%. There is severe hypokinesis of the entire inferolateral myocardium. There is akinesis of the basalinferior myocardium. The study was not technically sufficient to allow evaluation of LV diastolic dysfunction due to atrial fibrillation. -[place] score is 4.  -Due to ETOH abuse, not a good candidate for anticoagulation.   Coronary artery disease  -Cardiology consulted, underwent cath shows 80% LAD stenosis, circumflex and irght coronary are non obstructive.  -Cardiothoracic surgery consulted , recommended EGD prior to CABG. Feels patient is not a candidate for CABG -EGD done on 7/10 showing gastritis  and some antral erosions, no frank bleeding ulcers. -Plan for cardiac catheterization and possible PCI today  Acute kidney injury -Improved with gentle hydration. Stopped IV fluids.  -Creatinine 1.04  Hypertension  -Not well controlled.  -PRN hydralazine and on amlodipine.  Liver Cirrhosis Liver function panel wnl.   ETOH abuse -On CIWA protocol.  -No signs of withdrawal.   Left rib pain -Rib series ordered and negative for fracture.  -Pain control .   Abnormal UA -Urine cultures not sent on admission. He is asymptomatic. Denies any dysuria.  Continue to monitor.  -IV cipro started by CVTS. Completed 4 days of treatment.   Fall and scalp wound -No bleeding.   Left upper extremity pain -LUE [place] ordered, suspect due to old IV site -Continue to monitor  DVT Prophylaxis  Heparin  Code Status: Full  Family Communication: None at bedside  Disposition Plan: Admitted for observation. Pending further recommendations from cardiology.  Consultants Gastroenterology Cardiology/EP Cardiothoracic surgery  Procedures  Echocardiogram EGD Cardiac catheterization  Antibiotics   Anti-infectives    Start     Dose/Rate Route Frequency Ordered Stop   11/28/15 1100  ciprofloxacin (CIPRO) tablet 500 mg     500 mg Oral 2 times daily 11/28/15 0956     11/24/15 1400  ciprofloxacin (CIPRO) IVPB 400 mg  Status:  Discontinued     400 mg 200 mL/hr over 60 Minutes Intravenous Every 12 hours 11/24/15 1330 11/28/15 0956      Subjective:   Derrick Ramos seen and examined today. Patient has no complaints today. Denies chest pain, shortness of breath, abdominal pain, dizziness, headache.    Objective:   Filed Vitals:   11/28/15 1527 11/28/15 2032 11/29/15 0425 11/29/15  1102  BP: 123/73 142/67 140/67 116/69  Pulse: 74 68 72 72  Temp: 98.4 F (36.9 C) 99.3 F (37.4 C) 99.1 F (37.3 C)   TempSrc: Oral Oral Oral   Resp: 18 18 20    Height:      Weight:   59.149 kg (130 lb  6.4 oz)   SpO2: 100% 100% 96%     Intake/Output Summary (Last 24 hours) at 11/29/15 1302 Last data filed at 11/29/15 1140  Gross per 24 hour  Intake    400 ml  Output    425 ml  Net    -25 ml   Filed Weights   11/27/15 0842 11/28/15 0446 11/29/15 0425  Weight: 58.06 kg (128 lb) 59.376 kg (130 lb 14.4 oz) 59.149 kg (130 lb 6.4 oz)    Exam  General: Well developed, well nourished, NAD, appears stated age  HEENT: NCAT, mucous membranes moist.   Cardiovascular: S1 S2 auscultated, no rubs, murmurs or gallops. Regular rate and rhythm.  Respiratory: Clear to auscultation bilaterally with equal chest rise  Abdomen: Soft, nontender, nondistended, + bowel sounds  Extremities: warm dry without cyanosis clubbing or edema  Neuro: AAOx3, nonfocal  Psych: Flat   Data Reviewed: I have personally reviewed following labs and imaging studies  CBC:  Recent Labs Lab 11/24/15 1113  11/25/15 0709  11/27/15 0709 11/27/15 0843 11/27/15 1847 11/28/15 0630 11/29/15 0327  WBC 7.9  --  7.4  --   --   --   --   --  10.7*  HGB 13.4  < > 12.7*  < > 12.8* 13.6 12.7* 12.6* 12.1*  HCT 40.1  < > 38.8*  < > 38.0* 40.0 37.3* 37.2* 36.9*  MCV 97.1  --  96.8  --   --   --   --   --  96.1  PLT 161  --  160  166  --   --   --   --   --  282  < > = values in this interval not displayed. Basic Metabolic Panel:  Recent Labs Lab 11/23/15 1600 11/24/15 1113 11/25/15 0709 11/26/15 1400 11/27/15 0709 11/27/15 0843 11/28/15 1106 11/29/15 0327  NA 137  --  139  --  135 139 137 138  K 4.1  --  3.4* 3.0* 2.8* 3.5 3.3* 3.3*  CL 106  --  107  --  101  --  102 103  CO2 25  --  26  --  24  --  27 27  GLUCOSE 109*  --  97  --  97 92 105* 93  BUN <5*  --  <5*  --  <5*  --  <5* 5*  CREATININE 0.95 0.99 0.95  --  0.85  --  0.99 1.04  CALCIUM 8.7*  --  8.0*  --  8.2*  --  8.1* 8.0*  MG  --   --   --   --   --   --   --  0.8*   GFR: Estimated Creatinine Clearance: 54.5 mL/min (by C-G formula based on  Cr of 1.04). Liver Function Tests:  Recent Labs Lab 11/25/15 0709  AST 17  ALT 13*  ALKPHOS 71  BILITOT 0.8  PROT 5.5*  ALBUMIN 2.6*   No results for input(s): LIPASE, AMYLASE in the last 168 hours.  Recent Labs Lab 11/25/15 0709  AMMONIA 17   Coagulation Profile:  Recent Labs Lab 11/23/15 1029 11/25/15 0709  INR 1.00 1.00  Cardiac Enzymes: No results for input(s): CKTOTAL, CKMB, CKMBINDEX, TROPONINI in the last 168 hours. BNP (last 3 results) No results for input(s): PROBNP in the last 8760 hours. HbA1C: No results for input(s): HGBA1C in the last 72 hours. CBG: No results for input(s): GLUCAP in the last 168 hours. Lipid Profile:  Recent Labs  11/28/15 1106  CHOL 117  HDL 42  LDLCALC 60  TRIG 73  CHOLHDL 2.8   Thyroid Function Tests: No results for input(s): TSH, T4TOTAL, FREET4, T3FREE, THYROIDAB in the last 72 hours. Anemia Panel: No results for input(s): VITAMINB12, FOLATE, FERRITIN, TIBC, IRON, RETICCTPCT in the last 72 hours. Urine analysis:    Component Value Date/Time   COLORURINE ORANGE* 11/21/2015 2148   APPEARANCEUR TURBID* 11/21/2015 2148   LABSPEC 1.026 11/21/2015 2148   PHURINE 5.0 11/21/2015 2148   GLUCOSEU 100* 11/21/2015 2148   HGBUR NEGATIVE 11/21/2015 2148   BILIRUBINUR MODERATE* 11/21/2015 2148   KETONESUR 15* 11/21/2015 2148   PROTEINUR 100* 11/21/2015 2148   NITRITE POSITIVE* 11/21/2015 2148   LEUKOCYTESUR SMALL* 11/21/2015 2148   Sepsis Labs: (procalcitonin:4,lacticidven:4)  ) Recent Results (from the past 240 hour(s))  Surgical pcr screen     Status: None   Collection Time: 11/26/15 10:02 PM  Result Value Ref Range Status   MRSA, PCR NEGATIVE NEGATIVE Final   Staphylococcus aureus NEGATIVE NEGATIVE Final    Comment:        The Xpert SA Assay (FDA approved for NASAL specimens in patients over 7 years of age), is one component of a comprehensive surveillance program.  Test performance has been  validated by [facility] for patients greater than or equal to 20 year old. It is not intended to diagnose infection nor to guide or monitor treatment.       Radiology Studies: No results found.   Scheduled Meds: . amLODipine  10 mg Oral Daily  . aspirin EC  81 mg Oral Daily  . atorvastatin  20 mg Oral q1800  . ciprofloxacin  500 mg Oral BID  . feeding supplement  1 Container Oral TID BM  . folic acid  1 mg Oral Daily  . heparin  5,000 Units Subcutaneous Q8H  . metoprolol tartrate  75 mg Oral BID  . multivitamin with minerals  1 tablet Oral Daily  . pantoprazole  40 mg Oral BID  . potassium chloride  20 mEq Oral Daily  . sodium chloride flush  3 mL Intravenous Q12H  . thiamine  100 mg Oral Daily  . ticagrelor  180 mg Oral Once   Continuous Infusions: . lactated ringers 10 mL/hr at 11/27/15 0855    Time Spent in minutes   30 minutes  Lenwood Balsam D.O. on 11/29/2015 at 1:02 PM  Between 7am to 7pm - Pager - [phone]  After 7pm go to [email or web address] - password [email or web address]  And look for the night coverage person covering for me after hours  [facility] Office  [phone]

## 2015-11-29 NOTE — Progress Notes (Signed)
1045 Offered to walk with pt. Pt declined due to left arm pain from old IV site and rib pain. Not feeling well. For cath this afternoon. Encouraged pt to walk with staff later after cath. Will follow up tomorrow. Luetta Nutting RN BSN 11/29/2015 10:46 AM

## 2015-11-29 NOTE — Progress Notes (Addendum)
Received call from MD in cath lab. Asked to make sure that pt did not eat breakfast. Upon entering room pt had already consumed 240 mL of fluid and had 3 bites of breakfast. Informed pt that he is NPO and cardiology will come see him. Placed NPO order. Updated Lauren RN who is primary RN for patient. Call light within reach.  Pt scheduled for cardiac cath at 1pm.  Derrick Romberg, RN

## 2015-11-29 NOTE — Interval H&P Note (Signed)
History and Physical Interval Note:  11/29/2015 1:54 PM  Derrick Ramos  has presented today for PCI of the LAD with the diagnosis of unstable angina, CAD. The various methods of treatment have been discussed with the patient and family. After consideration of risks, benefits and other options for treatment, the patient has consented to  Procedure(s): Coronary Stent Intervention  (N/A) as a surgical intervention .  The patient's history has been reviewed, patient examined, no change in status, stable for surgery.  I have reviewed the patient's chart and labs.  Questions were answered to the patient's satisfaction.    Cath Lab Visit (complete for each Cath Lab visit)  Clinical Evaluation Leading to the Procedure:   ACS: No.  Non-ACS:    Anginal Classification: CCS III  Anti-ischemic medical therapy: Maximal Therapy (2 or more classes of medications)  Non-Invasive Test Results: No non-invasive testing performed  Prior CABG: No previous CABG         Derrick Ramos

## 2015-11-29 NOTE — Progress Notes (Signed)
Nutrition Follow Up  DOCUMENTATION CODES:   Non-severe (moderate) malnutrition in context of chronic illness  INTERVENTION:   Continue Boost Breeze po TID, each supplement provides 250 kcal and 9 grams of protein  Continue MVI with minerals daily  NUTRITION DIAGNOSIS:   Increased nutrient needs related to chronic illness as evidenced by estimated needs, ongoing  GOAL:   Patient will meet greater than or equal to 90% of their needs, progressing  MONITOR:   PO intake, Supplement acceptance, Labs, Weight trends, I & O's  ASSESSMENT:   70 y.o. Male with a history of cirrhosis (self-reported), daily EtOH use, active tobacco use, PVD, and HTN who presents to the ED accompanied by his wife and daughter who has had recurrent syncopal episodes. The episode today resulted in head trauma and laceration requiring staples in the ED. EKG also shows atrial fibrillation (rate controlled), which is new for him.  Patient again not very conversive with RD interview. Reports he's eating OK >> PO intake 50% per flowsheet records. Pt currently NPO >> malnutrition ongoing. States he is drinking his oral nutrition supplements Teacher, early years/pre).  Diet Order:  Diet NPO time specified  Skin:  Reviewed, no issues  Last BM:  7/10  Height:   Ht Readings from Last 1 Encounters:  11/27/15 5\' 6"  (1.676 m)    Weight:   Wt Readings from Last 1 Encounters:  11/29/15 130 lb 6.4 oz (59.149 kg)    Ideal Body Weight:  59 kg  BMI:  Body mass index is 21.06 kg/(m^2).  Estimated Nutritional Needs:   Kcal:  1700-1900  Protein:  80-90 gm  Fluid:  1.7-1.9 L  EDUCATION NEEDS:   No education needs identified at this time  Maureen Chatters, RD, LDN Pager #: [phone] After-Hours Pager #: [phone]

## 2015-11-29 NOTE — Progress Notes (Addendum)
Patient Name: Derrick Ramos Date of Encounter: 11/29/2015  Principal Problem:   Syncope and collapse Active Problems:   Atrial fibrillation (HCC)   Hypokalemia   HTN (hypertension)   Hepatic cirrhosis (HCC)   Hematemesis-endo 11/27/15   AKI (acute kidney injury) (HCC)   Laceration of head   Tobacco use disorder   Alcohol abuse   Malnutrition of moderate degree   CAD- 80% Ca++ proximal LAD   Atherosclerotic PVD with intermittent claudication (HCC)   Length of Stay:   SUBJECTIVE  No angina overnight, denies dyspnea.   CURRENT MEDS . amLODipine  10 mg Oral Daily  . aspirin EC  81 mg Oral Daily  . atorvastatin  80 mg Oral q1800  . ciprofloxacin  500 mg Oral BID  . feeding supplement  1 Container Oral TID BM  . folic acid  1 mg Oral Daily  . heparin  5,000 Units Subcutaneous Q8H  . metoprolol tartrate  75 mg Oral BID  . multivitamin with minerals  1 tablet Oral Daily  . pantoprazole  40 mg Oral BID  . potassium chloride  20 mEq Oral Daily  . sodium chloride flush  3 mL Intravenous Q12H  . thiamine  100 mg Oral Daily    OBJECTIVE   Intake/Output Summary (Last 24 hours) at 11/29/15 0958 Last data filed at 11/29/15 0548  Gross per 24 hour  Intake    400 ml  Output    125 ml  Net    275 ml   Filed Weights   11/27/15 0842 11/28/15 0446 11/29/15 0425  Weight: 58.06 kg (128 lb) 59.376 kg (130 lb 14.4 oz) 59.149 kg (130 lb 6.4 oz)    PHYSICAL EXAM Filed Vitals:   11/28/15 0446 11/28/15 1527 11/28/15 2032 11/29/15 0425  BP: 148/80 123/73 142/67 140/67  Pulse: 71 74 68 72  Temp: 98.7 F (37.1 C) 98.4 F (36.9 C) 99.3 F (37.4 C) 99.1 F (37.3 C)  TempSrc: Oral Oral Oral Oral  Resp: 18 18 18 20   Height:      Weight: 59.376 kg (130 lb 14.4 oz)   59.149 kg (130 lb 6.4 oz)  SpO2: 100% 100% 100% 96%   General: Alert, oriented x3, no distress Head: no evidence of trauma, PERRL, EOMI, no exophtalmos or lid lag, no myxedema, no xanthelasma; normal ears, nose and  oropharynx Neck: normal jugular venous pulsations and no hepatojugular reflux; brisk carotid pulses without delay and no carotid bruits Chest: clear to auscultation, no signs of consolidation by percussion or palpation, normal fremitus, symmetrical and full respiratory excursions Cardiovascular: normal position and quality of the apical impulse, regular rhythm, normal first and second heart sounds, no rubs or gallops, no murmur Abdomen: no tenderness or distention, no masses by palpation, no abnormal pulsatility or arterial bruits, normal bowel sounds, no hepatosplenomegaly Extremities: no clubbing, cyanosis or edema; 2+ radial, ulnar and brachial pulses bilaterally; 2+ right femoral, posterior tibial and dorsalis pedis pulses; 2+ left femoral, posterior tibial and dorsalis pedis pulses; no subclavian or femoral bruits Neurological: grossly nonfocal  LABS  CBC  Recent Labs  11/28/15 0630 11/29/15 0327  WBC  --  10.7*  HGB 12.6* 12.1*  HCT 37.2* 36.9*  MCV  --  96.1  PLT  --  282   Basic Metabolic Panel  Recent Labs  11/28/15 1106 11/29/15 0327  NA 137 138  K 3.3* 3.3*  CL 102 103  CO2 27 27  GLUCOSE 105* 93  BUN <5* 5*  CREATININE 0.99 1.04  CALCIUM 8.1* 8.0*  MG  --  0.8*   Fasting Lipid Panel  Recent Labs  11/28/15 1106  CHOL 117  HDL 42  LDLCALC 60  TRIG 73  CHOLHDL 2.8   Thyroid Function Tests No results for input(s): TSH, T4TOTAL, T3FREE, THYROIDAB in the last 72 hours.  Invalid input(s): FREET3  Radiology Studies Imaging results have been reviewed and No results found.  TELE NSR  ASSESSMENT AND PLAN  1. CAD - Discussed CABG with Dr. Donata Clay - he believes that the risk of liver-related complications is high and reviewed the angiograms with Dr. Clifton James - he believes the proximal LAD can be stented, hopefully without rotablation. Discussed PCI-stent with Mr. Okey and placed particular emphasis on the need for compliance with DAPT and the  potentially fatal risk of noncompliance and stent thrombosis. Also reviewed the increased risk of bleeding. Since the vessel is large in caliber (maybe 4.0 mm) and the lesion is short (maybe can be covered with a 12 mm stent), may be able to use a bare metal stent and shorten the necessary period of antiplatelet therapy.  This procedure has been fully reviewed with the patient and written informed consent has been obtained. LDL is not high, would reduce statin dose due to liver disease. This procedure has been fully reviewed with the patient and written informed consent has been obtained. 2. PAF - currently in sinus rhythm. This patients CHA2DS2-VASc Score and unadjusted Ischemic Stroke Rate (% per year) is equal to 4.8 % stroke rate/year from a score of 4, but the risk of full antithrombotic therapy is high, especially while on antiplatelet therapy. Would reassess anticoagulation after we stop DAPT. 3. 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  4. Syncope - plan event monitor at DC if no diagnostic arrhythmia while inpatient 5. PAD 6. Cirrhosis  7. Alcohol abuse 8. Tobacco abuse. Discussed stopping both for heart and liver health.     Thurmon Fair, MD, [facility] [phone] office [phone] pager 11/29/2015 9:58 AM

## 2015-11-29 NOTE — Progress Notes (Signed)
Patient c/o pain with Left I.V. Site. Tender to touch sl pink hard. D.C. I.V/ Up on pillow and applied heat packet to it.refuse pain med

## 2015-11-30 ENCOUNTER — Other Ambulatory Visit: Payer: Self-pay | Admitting: Physician Assistant

## 2015-11-30 DIAGNOSIS — R55 Syncope and collapse: Secondary | ICD-10-CM

## 2015-11-30 LAB — BASIC METABOLIC PANEL
Anion gap: 9 (ref 5–15)
BUN: 6 mg/dL (ref 6–20)
CHLORIDE: 105 mmol/L (ref 101–111)
CO2: 22 mmol/L (ref 22–32)
CREATININE: 1.08 mg/dL (ref 0.61–1.24)
Calcium: 8.4 mg/dL — ABNORMAL LOW (ref 8.9–10.3)
GFR calc Af Amer: >60 mL/min (ref >60)
GFR calc non Af Amer: >60 mL/min (ref >60)
GLUCOSE: 94 mg/dL (ref 65–99)
POTASSIUM: 4 mmol/L (ref 3.5–5.1)
Sodium: 136 mmol/L (ref 135–145)

## 2015-11-30 LAB — CBC
HEMATOCRIT: 36.4 % — AB (ref 39.0–52.0)
Hemoglobin: 12.3 g/dL — ABNORMAL LOW (ref 13.0–17.0)
MCH: 32.5 pg (ref 26.0–34.0)
MCHC: 33.8 g/dL (ref 30.0–36.0)
MCV: 96.3 fL (ref 78.0–100.0)
Platelets: 298 10*3/uL (ref 150–400)
RBC: 3.78 MIL/uL — ABNORMAL LOW (ref 4.22–5.81)
RDW: 13 % (ref 11.5–15.5)
WBC: 15.4 10*3/uL — AB (ref 4.0–10.5)

## 2015-11-30 LAB — GLUCOSE, CAPILLARY: GLUCOSE-CAPILLARY: 104 mg/dL — AB (ref 65–99)

## 2015-11-30 LAB — MAGNESIUM: Magnesium: 1.7 mg/dL (ref 1.7–2.4)

## 2015-11-30 MED ORDER — LISINOPRIL 5 MG PO TABS
5.0000 mg | ORAL_TABLET | Freq: Every day | ORAL | Status: DC
  Administered 2015-11-30 – 2015-12-01 (×2): 5 mg via ORAL
  Filled 2015-11-30 (×2): qty 1

## 2015-11-30 NOTE — Progress Notes (Signed)
Patient arrived the unit on a wheelchair, assessment completed see flowsheet, patient oriented to room and staff, placed on tele ccmd notified, bed in lowest position, call light within reach, will continue to monitor.

## 2015-11-30 NOTE — Care Management Note (Signed)
Case Management Note  Patient Details  Name: Derrick Ramos MRN: [id number] Date of Birth: 29-Jan-1946  Subjective/Objective:    Pt presented with syncope. Pt is from home with wife and has support of family. Plan for d/c today with family support.                 Action/Plan: Brilinta- Pt copay will be $8.25- prior auth not required. CM will provide pt with 30 day free card. Pt uses [facility] on [address] [place] and medication is available. CM did suggest to family that a pill box may be helpful to keep up with scheduled medications.  No further needs from CM at this time.   Expected Discharge Date:                  Expected Discharge Plan:  Home/Self Care  In-House Referral:  NA  Discharge planning Services  CM Consult, Medication Assistance  Post Acute Care Choice:  NA Choice offered to:  NA  DME Arranged:  N/A DME Agency:  NA  HH Arranged:  NA HH Agency:  NA  Status of Service:  Completed, signed off  If discussed at Long Length of Stay Meetings, dates discussed:    Additional Comments:  Gala Lewandowsky, RN 11/30/2015, 10:31 AM

## 2015-11-30 NOTE — Progress Notes (Signed)
Patient Name: Derrick Ramos Date of Encounter: 11/30/2015  Principal Problem:   Syncope and collapse Active Problems:   Atrial fibrillation (HCC)   Hypokalemia   HTN (hypertension)   Hepatic cirrhosis (HCC)   Hematemesis-endo 11/27/15   AKI (acute kidney injury) (HCC)   Laceration of head   Tobacco use disorder   Alcohol abuse   Malnutrition of moderate degree   CAD- 80% Ca++ proximal LAD   Atherosclerotic PVD with intermittent claudication (HCC)   Coronary artery disease involving native coronary artery of native heart without angina pectoris   Syncope   Coronary artery disease involving native coronary artery of native heart with unstable angina pectoris (HCC)   Length of Stay: 1  SUBJECTIVE  No angina overnight, denies dyspnea. Uncomplicated PCI-LAD with bare metal stent yesterday.   CURRENT MEDS . amLODipine  10 mg Oral Daily  . aspirin EC  81 mg Oral Daily  . atorvastatin  20 mg Oral q1800  . ciprofloxacin  500 mg Oral BID  . feeding supplement  1 Container Oral TID BM  . folic acid  1 mg Oral Daily  . metoprolol tartrate  75 mg Oral BID  . multivitamin with minerals  1 tablet Oral Daily  . pantoprazole  40 mg Oral BID  . potassium chloride  20 mEq Oral Daily  . sodium chloride flush  3 mL Intravenous Q12H  . sodium chloride flush  3 mL Intravenous Q12H  . thiamine  100 mg Oral Daily  . ticagrelor  90 mg Oral BID    OBJECTIVE   Intake/Output Summary (Last 24 hours) at 11/30/15 0842 Last data filed at 11/30/15 2876  Gross per 24 hour  Intake  382.5 ml  Output    600 ml  Net -217.5 ml   Filed Weights   11/28/15 0446 11/29/15 0425 11/30/15 0455  Weight: 59.376 kg (130 lb 14.4 oz) 59.149 kg (130 lb 6.4 oz) 59 kg (130 lb 1.1 oz)    PHYSICAL EXAM Filed Vitals:   11/29/15 2000 11/29/15 2012 11/30/15 0455 11/30/15 0830  BP:  150/54 146/52 150/79  Pulse: 75 78 74 81  Temp:  97.6 F (36.4 C) 98.5 F (36.9 C) 99.2 F (37.3 C)  TempSrc:  Axillary  Axillary Oral  Resp: 23 14 13 15   Height:      Weight:   59 kg (130 lb 1.1 oz)   SpO2: 98% 100% 99% 100%   General: Alert, oriented x3, no distress Head: no evidence of trauma, PERRL, EOMI, no exophtalmos or lid lag, no myxedema, no xanthelasma; normal ears, nose and oropharynx Neck: normal jugular venous pulsations and no hepatojugular reflux; brisk carotid pulses without delay and no carotid bruits Chest: clear to auscultation, no signs of consolidation by percussion or palpation, normal fremitus, symmetrical and full respiratory excursions Cardiovascular: normal position and quality of the apical impulse, regular rhythm, normal first and second heart sounds, no rubs or gallops, no murmur Abdomen: no tenderness or distention, no masses by palpation, no abnormal pulsatility or arterial bruits, normal bowel sounds, no hepatosplenomegaly Extremities: no clubbing, cyanosis or edema; 2+ radial, ulnar and brachial pulses bilaterally; 2+ right femoral, posterior tibial and dorsalis pedis pulses; 2+ left femoral, posterior tibial and dorsalis pedis pulses; no subclavian or femoral bruits Neurological: grossly nonfocal  LABS  CBC  Recent Labs  11/29/15 0327 11/30/15 0351  WBC 10.7* 15.4*  HGB 12.1* 12.3*  HCT 36.9* 36.4*  MCV 96.1 96.3  PLT 282 298   Basic  Metabolic Panel  Recent Labs  11/29/15 0327 11/30/15 0351  NA 138 136  K 3.3* 4.0  CL 103 105  CO2 27 22  GLUCOSE 93 94  BUN 5* 6  CREATININE 1.04 1.08  CALCIUM 8.0* 8.4*  MG 0.8* 1.7   Fasting Lipid Panel  Recent Labs  11/28/15 1106  CHOL 117  HDL 42  LDLCALC 60  TRIG 73  CHOLHDL 2.8    TELE NSR  ASSESSMENT AND PLAN  1. CAD s/p LAD BMS ASA and Brilinta x 30 days - told him about potentially fatal MI due to stent thrombosis if he is not compliant. LDL is not high, would reduce statin dose due to liver disease. After 30 days, would switch to DOAC for atrial fibrillation. 2. PAF - currently in sinus rhythm.  This patients CHA2DS2-VASc Score and unadjusted Ischemic Stroke Rate (% per year) is equal to 4.8 % stroke rate/year from a score of 4, but the risk of full antithrombotic therapy is high, especially while on antiplatelet therapy. Would reassess anticoagulation after we stop DAPT, in about 30 days 3. 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 LVEDP low at cath. Does not need diuretics. Add ACEi. Reevaluate EF in 3 months. 4. Syncope - etiology unclear; event monitor scheduled for Monday at [facility] office. 5. PAD 6. Cirrhosis  7. Alcohol abuse 8. Tobacco abuse. Discussed stopping both for heart and liver health.  Have made arrangements for event monitor and early Cardiology follow-up.   Thurmon Fair, MD, [facility] [phone] office [phone] pager 11/30/2015 8:42 AM

## 2015-11-30 NOTE — Progress Notes (Signed)
CARDIAC REHAB PHASE I   PRE:  Rate/Rhythm: 77 SR    BP: sitting 164/80    SaO2:   MODE:  Ambulation: 480 ft   POST:  Rate/Rhythm: 84 SR    BP: sitting 162/91     SaO2:   Tolerated well, steadier today than he has been. I used gait belt but no LOB. BP elevated. No c/o. Ed completed with daughter present. Voiced understanding. He is thinking about quitting smoking but made no comment on quitting drinking. Gave resources for quitting smoking, he declined fake cigarette. Will send referral to [place] [facility]. He understands taking meds are important and daughter sts he is good about taking his meds.  [id number]   Harriet Masson CES, ACSM 11/30/2015 10:28 AM

## 2015-11-30 NOTE — Progress Notes (Addendum)
PROGRESS NOTE   Derrick Ramos  BMW:[id number] DOB: 1945/09/07 DOA: 11/21/2015 PCP: Pcp Not In System   Brief Narrative:  Derrick Ramos is an 70 y.o. male gentleman with a history of cirrhosis (self-reported), daily EtOH use, active tobacco use, PVD, and HTN who presents to the ED accompanied by his wife and daughter who has had recurrent syncopal episodes. Cardiology consulted and he underwent cardiac cath on 7/7 showing LAD disease. CVT surgery consulted and recommendations given.  7/10 EGD.  Assessment & Plan   Syncope -possibly from ETOH abuse/ arrhythmias/ orthostatics  -EP consulted and appreciated- recommended cardiac event monitor: pickup scheduled for Monday 12/04/15 at 2:30pm [facility] office).  -Follow up with Dr. Allyson Sabal 01/09/16 at 10:15am   Anemia of blood loss/ GI bleed/ Co  -Hemoglobin on admission was 15.8 and trended downward to 12.5.  -FOBT + -old records from one year ago. He underwent EGD showing an antral ulcer and antral erosions. Colonoscopy shows tubular adenoma in the rectum and descending colon which were removed. Recommended to continue with BID PPI.  -GI consulted and recommended to get EGD done, showing normal esophagus, gastritis, antral erosions, erythematous duodenopathy . -hemoglobin currently 12.3  New onset / Paroxysmal atrial fibrillation: -Echo shows Systolic function was mildly to moderately reduced. The estimated ejection fraction was in the range of 40% to 45%. There is severe hypokinesis of the entire inferolateral myocardium. There is akinesis of the basalinferior myocardium. The study was not technically sufficient to allow evaluation of LV diastolic dysfunction due to atrial fibrillation. -[place] score is 4.  -Due to ETOH abuse, not a good candidate for anticoagulation.  -Reassess after dual antiplatelet therapy as been completed (30days)  Coronary artery disease  -Cardiology consulted, underwent cath shows 80% LAD stenosis, circumflex and  irght coronary are non obstructive.  -Cardiothoracic surgery consulted , recommended EGD prior to CABG. Feels patient is not a candidate for CABG -EGD done on 7/10 showing gastritis and some antral erosions, no frank bleeding ulcers. -S/p LAD BMS. Continue aspirin and Brilinta for 30 days -Continue statin (close monitoring of LFTs) -F/u appt @ [facility] office - Thursday 7/27 at 10am with Azalee Course, PA-C  Acute kidney injury -Improved with gentle hydration. Stopped IV fluids.  -Creatinine 1.04  Hypertension  -Not well controlled.  -PRN hydralazine and on amlodipine.  Liver Cirrhosis -Liver function panel wnl.   ETOH abuse -On CIWA protocol.  -No signs of withdrawal.   Left rib pain -Rib series ordered and negative for fracture.  -Pain control .   Abnormal UA -Urine cultures not sent on admission. He is asymptomatic. Denies any dysuria.  -Continue to monitor.  -IV cipro started by CVTS. Completed 4 days of treatment.   Fall and scalp wound -No bleeding.   Left upper extremity pain -suspect due to old IV site -LUE [place] ordered, negative for DVT or superficial vein thrombosis -Continue to monitor  Leukocytosis -Likely reactive -Continue to monitor CBC. Currently afebrile  Tobacco abuse -Discussed smoking cessation   DVT Prophylaxis  Heparin  Code Status: Full  Family Communication: None at bedside  Disposition Plan: Admitted for observation. Monitor CBC  Consultants Gastroenterology Cardiology/EP Cardiothoracic surgery  Procedures  Echocardiogram EGD Cardiac catheterization  Antibiotics   Anti-infectives    Start     Dose/Rate Route Frequency Ordered Stop   11/28/15 1100  ciprofloxacin (CIPRO) tablet 500 mg     500 mg Oral 2 times daily 11/28/15 0956     11/24/15 1400  ciprofloxacin (CIPRO)  IVPB 400 mg  Status:  Discontinued     400 mg 200 mL/hr over 60 Minutes Intravenous Every 12 hours 11/24/15 1330 11/28/15 0956      Subjective:    Derrick Ramos seen and examined today. Patient has no complaints today. Denies chest pain, shortness of breath, abdominal pain, dizziness, headache.    Objective:   Filed Vitals:   11/29/15 2012 11/30/15 0455 11/30/15 0755 11/30/15 0830  BP: 150/54 146/52  150/79  Pulse: 78 74  81  Temp: 97.6 F (36.4 C) 98.5 F (36.9 C)  99.2 F (37.3 C)  TempSrc: Axillary Axillary  Oral  Resp: Height:      Weight:  59 kg (130 lb 1.1 oz)    SpO2: 100% 99%  100%    Intake/Output Summary (Last 24 hours) at 11/30/15 1041 Last data filed at 11/30/15 0832  Gross per 24 hour  Intake  382.5 ml  Output    600 ml  Net -217.5 ml   Filed Weights   11/28/15 0446 11/29/15 0425 11/30/15 0455  Weight: 59.376 kg (130 lb 14.4 oz) 59.149 kg (130 lb 6.4 oz) 59 kg (130 lb 1.1 oz)    Exam  General: Well developed, well nourished, NAD  HEENT: NCAT, mucous membranes moist.   Cardiovascular: S1 S2 auscultated, no murmurs, RRR  Respiratory: Clear to auscultation bilaterally with equal chest rise  Abdomen: Soft, nontender, nondistended, + bowel sounds  Extremities: warm dry without cyanosis clubbing or edema  Neuro: AAOx3, nonfocal  Psych: Flat affect   Data Reviewed: I have personally reviewed following labs and imaging studies  CBC:  Recent Labs Lab 11/24/15 1113  11/25/15 0709  11/27/15 0843 11/27/15 1847 11/28/15 0630 11/29/15 0327 11/30/15 0351  WBC 7.9  --  7.4  --   --   --   --  10.7* 15.4*  HGB 13.4  < > 12.7*  < > 13.6 12.7* 12.6* 12.1* 12.3*  HCT 40.1  < > 38.8*  < > 40.0 37.3* 37.2* 36.9* 36.4*  MCV 97.1  --  96.8  --   --   --   --  96.1 96.3  PLT 161  --  160  166  --   --   --   --  282 298  < > = values in this interval not displayed. Basic Metabolic Panel:  Recent Labs Lab 11/25/15 0709  11/27/15 0709 11/27/15 0843 11/28/15 1106 11/29/15 0327 11/30/15 0351  NA 139  --  135 139 137 138 136  K 3.4*  < > 2.8* 3.5 3.3* 3.3* 4.0  CL 107  --  101   --  102 103 105  CO2 26  --  24  --  GLUCOSE 97  --  97 92 105* 93 94  BUN <5*  --  <5*  --  <5* 5* 6  CREATININE 0.95  --  0.85  --  0.99 1.04 1.08  CALCIUM 8.0*  --  8.2*  --  8.1* 8.0* 8.4*  MG  --   --   --   --   --  0.8* 1.7  < > = values in this interval not displayed. GFR: Estimated Creatinine Clearance: 52.4 mL/min (by C-G formula based on Cr of 1.08). Liver Function Tests:  Recent Labs Lab 11/25/15 0709  AST 17  ALT 13*  ALKPHOS 71  BILITOT 0.8  PROT 5.5*  ALBUMIN 2.6*   No results  for input(s): LIPASE, AMYLASE in the last 168 hours.  Recent Labs Lab 11/25/15 0709  AMMONIA 17   Coagulation Profile:  Recent Labs Lab 11/25/15 0709  INR 1.00   Cardiac Enzymes: No results for input(s): CKTOTAL, CKMB, CKMBINDEX, TROPONINI in the last 168 hours. BNP (last 3 results) No results for input(s): PROBNP in the last 8760 hours. HbA1C: No results for input(s): HGBA1C in the last 72 hours. CBG: No results for input(s): GLUCAP in the last 168 hours. Lipid Profile:  Recent Labs  11/28/15 1106  CHOL 117  HDL 42  LDLCALC 60  TRIG 73  CHOLHDL 2.8   Thyroid Function Tests: No results for input(s): TSH, T4TOTAL, FREET4, T3FREE, THYROIDAB in the last 72 hours. Anemia Panel: No results for input(s): VITAMINB12, FOLATE, FERRITIN, TIBC, IRON, RETICCTPCT in the last 72 hours. Urine analysis:    Component Value Date/Time   COLORURINE ORANGE* 11/21/2015 2148   APPEARANCEUR TURBID* 11/21/2015 2148   LABSPEC 1.026 11/21/2015 2148   PHURINE 5.0 11/21/2015 2148   GLUCOSEU 100* 11/21/2015 2148   HGBUR NEGATIVE 11/21/2015 2148   BILIRUBINUR MODERATE* 11/21/2015 2148   KETONESUR 15* 11/21/2015 2148   PROTEINUR 100* 11/21/2015 2148   NITRITE POSITIVE* 11/21/2015 2148   LEUKOCYTESUR SMALL* 11/21/2015 2148   Sepsis Labs: (procalcitonin:4,lacticidven:4)  ) Recent Results (from the past 240 hour(s))  Surgical pcr screen     Status: None    Collection Time: 11/26/15 10:02 PM  Result Value Ref Range Status   MRSA, PCR NEGATIVE NEGATIVE Final   Staphylococcus aureus NEGATIVE NEGATIVE Final    Comment:        The Xpert SA Assay (FDA approved for NASAL specimens in patients over 56 years of age), is one component of a comprehensive surveillance program.  Test performance has been validated by [facility] for patients greater than or equal to 14 year old. It is not intended to diagnose infection nor to guide or monitor treatment.       Radiology Studies: No results found.   Scheduled Meds: . amLODipine  10 mg Oral Daily  . aspirin EC  81 mg Oral Daily  . atorvastatin  20 mg Oral q1800  . ciprofloxacin  500 mg Oral BID  . feeding supplement  1 Container Oral TID BM  . folic acid  1 mg Oral Daily  . lisinopril  5 mg Oral Daily  . metoprolol tartrate  75 mg Oral BID  . multivitamin with minerals  1 tablet Oral Daily  . pantoprazole  40 mg Oral BID  . potassium chloride  20 mEq Oral Daily  . sodium chloride flush  3 mL Intravenous Q12H  . sodium chloride flush  3 mL Intravenous Q12H  . thiamine  100 mg Oral Daily  . ticagrelor  90 mg Oral BID   Continuous Infusions: . lactated ringers 10 mL/hr at 11/27/15 0855    Time Spent in minutes   30 minutes  Jenkins Risdon D.O. on 11/30/2015 at 10:41 AM  Between 7am to 7pm - Pager - [phone]  After 7pm go to [email or web address] - password [email or web address]  And look for the night coverage person covering for me after hours  [facility] Office  [phone]

## 2015-11-30 NOTE — Progress Notes (Signed)
Physical Therapy Treatment Patient Details Name: Derrick Ramos MRN: [id number] DOB: Dec 29, 1945 Today's Date: 11/30/2015    History of Present Illness 70 y.o. gentleman with a history of cirrhosis (self-reported), daily EtOH use, active tobacco use, PVD, and HTN who presented to the ED after multiple episodes of syncope. Syncopal episode on 11-21-15 resulted in head trauma and laceration requiring staple in the ED.  Pt underwent cardiac cath on 7/7 showing LAD disease.    PT Comments    Derrick Ramos demonstrates instability when not using RW to ambulate, requiring min assist to steady due to LOB x1 ambulating to bathroom.  Once RW introduced pt at supervision level with cues provided for safe technique use of RW.     Follow Up Recommendations  No PT follow up;Supervision for mobility/OOB     Equipment Recommendations  Rolling walker with 5" wheels    Recommendations for Other Services       Precautions / Restrictions Precautions Precautions: Fall Restrictions Weight Bearing Restrictions: No    Mobility  Bed Mobility Overal bed mobility: Modified Independent Bed Mobility: Supine to Sit     Supine to sit: Modified independent (Device/Increase time);HOB elevated     General bed mobility comments: HOB elevated; increased time  Transfers Overall transfer level: Needs assistance Equipment used: None Transfers: Sit to/from Stand Sit to Stand: Min guard         General transfer comment: Min guard for safety as pt is impulsive and quickly stands from bed to get to bathroom without RW.  Ambulation/Gait Ambulation/Gait assistance: Min assist;Supervision Ambulation Distance (Feet): 400 Feet Assistive device: None;Rolling walker (2 wheeled) Gait Pattern/deviations: Step-through pattern;Decreased stride length;Staggering right     General Gait Details: Pt impulsive and stands quickly and begins ambulating to bathroom without RW with LOB to Rt x1, requiring min assist to steady.   Pt steady while ambulating straight ahead with RW.  Instability with turns and requires cues to keep feet within RW. Cues for upright posture and forward gaze.   Stairs            Wheelchair Mobility    Modified Rankin (Stroke Patients Only)       Balance Overall balance assessment: Needs assistance Sitting-balance support: No upper extremity supported;Feet supported Sitting balance-Leahy Scale: Good     Standing balance support: During functional activity;Bilateral upper extremity supported Standing balance-Leahy Scale: Fair Standing balance comment: Unsteady without RW             High level balance activites: Backward walking;Direction changes;Turns;Sudden stops;Head turns;Other (comment) (stepping over objects) High Level Balance Comments: Min instability while turning requring cues for proper technique using RW.    Cognition Arousal/Alertness: Awake/alert Behavior During Therapy: Flat affect Overall Cognitive Status: Within Functional Limits for tasks assessed                      Exercises      General Comments        Pertinent Vitals/Pain Pain Assessment: No/denies pain    Home Living                      Prior Function            PT Goals (current goals can now be found in the care plan section) Acute Rehab PT Goals Patient Stated Goal: none stated PT Goal Formulation: With patient Time For Goal Achievement: 12/07/15 Potential to Achieve Goals: Good Progress towards PT goals: Progressing toward goals  Frequency  Min 3X/week    PT Plan Current plan remains appropriate    Co-evaluation             End of Session Equipment Utilized During Treatment: Gait belt Activity Tolerance: Patient tolerated treatment well Patient left: in bed;Other (comment) (pt sitting EOB)     Time: 1610-9604 PT Time Calculation (min) (ACUTE ONLY): 19 min  Charges:  $Gait Training: 8-22 mins                    G Codes:       Encarnacion Chu PT, DPT  Pager: [phone] Phone: [phone] 11/30/2015, 4:38 PM

## 2015-11-30 NOTE — Progress Notes (Addendum)
Cardiac event monitor pickup scheduled for Monday 12/04/15 at  2:30pm [facility] office). F/u appt @ [facility] office - Thursday 7/27 at 10am with Azalee Course, PA-C. (no availability for NL appts in next 3 weeks). Per d/w Dr. Salena Saner will also arrange 5 week f/u to review monitor - scheduled with Dr. Allyson Sabal 01/09/16 at 10:15am - This will be at the [facility] LOCATION.  I personally told the patient about the location differences. Dayna Dunn PA-C

## 2015-12-01 ENCOUNTER — Inpatient Hospital Stay (HOSPITAL_COMMUNITY): Payer: Medicare Other

## 2015-12-01 DIAGNOSIS — E440 Moderate protein-calorie malnutrition: Secondary | ICD-10-CM

## 2015-12-01 DIAGNOSIS — Z955 Presence of coronary angioplasty implant and graft: Secondary | ICD-10-CM

## 2015-12-01 DIAGNOSIS — D72829 Elevated white blood cell count, unspecified: Secondary | ICD-10-CM

## 2015-12-01 LAB — BASIC METABOLIC PANEL
Anion gap: 8 (ref 5–15)
BUN: 8 mg/dL (ref 6–20)
CALCIUM: 8.7 mg/dL — AB (ref 8.9–10.3)
CO2: 22 mmol/L (ref 22–32)
CREATININE: 1.01 mg/dL (ref 0.61–1.24)
Chloride: 105 mmol/L (ref 101–111)
GFR calc Af Amer: >60 mL/min (ref >60)
GFR calc non Af Amer: >60 mL/min (ref >60)
GLUCOSE: 129 mg/dL — AB (ref 65–99)
Potassium: 3.2 mmol/L — ABNORMAL LOW (ref 3.5–5.1)
Sodium: 135 mmol/L (ref 135–145)

## 2015-12-01 LAB — MAGNESIUM: Magnesium: 1.6 mg/dL — ABNORMAL LOW (ref 1.7–2.4)

## 2015-12-01 LAB — URINALYSIS, ROUTINE W REFLEX MICROSCOPIC
Bilirubin Urine: NEGATIVE
Glucose, UA: NEGATIVE mg/dL
HGB URINE DIPSTICK: NEGATIVE
Ketones, ur: NEGATIVE mg/dL
Leukocytes, UA: NEGATIVE
Nitrite: NEGATIVE
PH: 6 (ref 5.0–8.0)
Protein, ur: NEGATIVE mg/dL
SPECIFIC GRAVITY, URINE: 1.013 (ref 1.005–1.030)

## 2015-12-01 LAB — CBC
HEMATOCRIT: 34.7 % — AB (ref 39.0–52.0)
HEMOGLOBIN: 11.6 g/dL — AB (ref 13.0–17.0)
MCH: 32 pg (ref 26.0–34.0)
MCHC: 33.4 g/dL (ref 30.0–36.0)
MCV: 95.9 fL (ref 78.0–100.0)
Platelets: 334 10*3/uL (ref 150–400)
RBC: 3.62 MIL/uL — AB (ref 4.22–5.81)
RDW: 12.8 % (ref 11.5–15.5)
WBC: 15.4 10*3/uL — ABNORMAL HIGH (ref 4.0–10.5)

## 2015-12-01 MED ORDER — PANTOPRAZOLE SODIUM 40 MG PO TBEC: 40.0000 mg | DELAYED_RELEASE_TABLET | Freq: Two times a day (BID) | ORAL | Status: AC

## 2015-12-01 MED ORDER — TICAGRELOR 90 MG PO TABS: 90.0000 mg | ORAL_TABLET | Freq: Two times a day (BID) | ORAL | Status: DC

## 2015-12-01 MED ORDER — POTASSIUM CHLORIDE CRYS ER 20 MEQ PO TBCR: 20.0000 meq | EXTENDED_RELEASE_TABLET | Freq: Every day | ORAL | Status: DC

## 2015-12-01 MED ORDER — ASPIRIN 81 MG PO TBEC: 81.0000 mg | DELAYED_RELEASE_TABLET | Freq: Every day | ORAL | Status: AC

## 2015-12-01 MED ORDER — LISINOPRIL 5 MG PO TABS: 5.0000 mg | ORAL_TABLET | Freq: Every day | ORAL | Status: DC

## 2015-12-01 MED ORDER — BOOST / RESOURCE BREEZE PO LIQD: 1.0000 | Freq: Three times a day (TID) | ORAL | Status: DC

## 2015-12-01 MED ORDER — POTASSIUM CHLORIDE CRYS ER 20 MEQ PO TBCR
40.0000 meq | EXTENDED_RELEASE_TABLET | Freq: Once | ORAL | Status: AC
  Administered 2015-12-01: 40 meq via ORAL

## 2015-12-01 MED ORDER — ATORVASTATIN CALCIUM 20 MG PO TABS: 20.0000 mg | ORAL_TABLET | Freq: Every day | ORAL | Status: DC

## 2015-12-01 MED ORDER — ADULT MULTIVITAMIN W/MINERALS CH
1.0000 | ORAL_TABLET | Freq: Every day | ORAL | Status: DC
Start: 2015-12-01 — End: 2016-05-29

## 2015-12-01 MED ORDER — FOLIC ACID 1 MG PO TABS: 1.0000 mg | ORAL_TABLET | Freq: Every day | ORAL | Status: DC

## 2015-12-01 MED ORDER — NITROGLYCERIN 0.4 MG SL SUBL: 0.4000 mg | SUBLINGUAL_TABLET | SUBLINGUAL | Status: DC | PRN

## 2015-12-01 MED ORDER — THIAMINE HCL 100 MG PO TABS: 100.0000 mg | ORAL_TABLET | Freq: Every day | ORAL | Status: DC

## 2015-12-01 MED ORDER — METOPROLOL TARTRATE 75 MG PO TABS: 75.0000 mg | ORAL_TABLET | Freq: Two times a day (BID) | ORAL | Status: DC

## 2015-12-01 NOTE — Care Management Important Message (Signed)
Important Message  Patient Details  Name: Derrick Ramos MRN: [id number] Date of Birth: 1946/02/02   Medicare Important Message Given:  Yes    [name] 12/01/2015, 11:34 AM

## 2015-12-01 NOTE — Discharge Instructions (Signed)
Atrial Fibrillation °Atrial fibrillation is a type of irregular or rapid heartbeat (arrhythmia). In atrial fibrillation, the heart quivers continuously in a chaotic pattern. This occurs when parts of the heart receive disorganized signals that make the heart unable to pump blood normally. This can increase the risk for stroke, heart failure, and other heart-related conditions. There are different types of atrial fibrillation, including: °· Paroxysmal atrial fibrillation. This type starts suddenly, and it usually stops on its own shortly after it starts. °· Persistent atrial fibrillation. This type often lasts longer than a week. It may stop on its own or with treatment. °· Long-lasting persistent atrial fibrillation. This type lasts longer than 12 months. °· Permanent atrial fibrillation. This type does not go away. °Talk with your health care provider to learn about the type of atrial fibrillation that you have. °CAUSES °This condition is caused by some heart-related conditions or procedures, including: °· A heart attack. °· Coronary artery disease. °· Heart failure. °· Heart valve conditions. °· High blood pressure. °· Inflammation of the sac that surrounds the heart (pericarditis). °· Heart surgery. °· Certain heart rhythm disorders, such as Wolf-Parkinson-White syndrome. °Other causes include: °· Pneumonia. °· Obstructive sleep apnea. °· Blockage of an artery in the lungs (pulmonary embolism, or PE). °· Lung cancer. °· Chronic lung disease. °· Thyroid problems, especially if the thyroid is overactive (hyperthyroidism). °· Caffeine. °· Excessive alcohol use or illegal drug use. °· Use of some medicines, including certain decongestants and diet pills. °Sometimes, the cause cannot be found. °RISK FACTORS °This condition is more likely to develop in: °· People who are older in age. °· People who smoke. °· People who have diabetes mellitus. °· People who are overweight (obese). °· Athletes who exercise  vigorously. °SYMPTOMS °Symptoms of this condition include: °· A feeling that your heart is beating rapidly or irregularly. °· A feeling of discomfort or pain in your chest. °· Shortness of breath. °· Sudden light-headedness or weakness. °· Getting tired easily during exercise. °In some cases, there are no symptoms. °DIAGNOSIS °Your health care provider may be able to detect atrial fibrillation when taking your pulse. If detected, this condition may be diagnosed with: °· An electrocardiogram (ECG). °· A Holter monitor test that records your heartbeat patterns over a 24-hour period. °· Transthoracic echocardiogram (TTE) to evaluate how blood flows through your heart. °· Transesophageal echocardiogram (TEE) to view more detailed images of your heart. °· A stress test. °· Imaging tests, such as a CT scan or chest X-ray. °· Blood tests. °TREATMENT °The main goals of treatment are to prevent blood clots from forming and to keep your heart beating at a normal rate and rhythm. The type of treatment that you receive depends on many factors, such as your underlying medical conditions and how you feel when you are experiencing atrial fibrillation. °This condition may be treated with: °· Medicine to slow down the heart rate, bring the heart's rhythm back to normal, or prevent clots from forming. °· Electrical cardioversion. This is a procedure that resets your heart's rhythm by delivering a controlled, low-energy shock to the heart through your skin. °· Different types of ablation, such as catheter ablation, catheter ablation with pacemaker, or surgical ablation. These procedures destroy the heart tissues that send abnormal signals. When the pacemaker is used, it is placed under your skin to help your heart beat in a regular rhythm. °HOME CARE INSTRUCTIONS °· Take over-the counter and prescription medicines only as told by your health care provider. °·   If your health care provider prescribed a blood-thinning medicine  (anticoagulant), take it exactly as told. Taking too much blood-thinning medicine can cause bleeding. If you do not take enough blood-thinning medicine, you will not have the protection that you need against stroke and other problems.  Do not use tobacco products, including cigarettes, chewing tobacco, and e-cigarettes. If you need help quitting, ask your health care provider.  If you have obstructive sleep apnea, manage your condition as told by your health care provider.  Do not drink alcohol.  Do not drink beverages that contain caffeine, such as coffee, soda, and tea.  Maintain a healthy weight. Do not use diet pills unless your health care provider approves. Diet pills may make heart problems worse.  Follow diet instructions as told by your health care provider.  Exercise regularly as told by your health care provider.  Keep all follow-up visits as told by your health care provider. This is important. PREVENTION  Avoid drinking beverages that contain caffeine or alcohol.  Avoid certain medicines, especially medicines that are used for breathing problems.  Avoid certain herbs and herbal medicines, such as those that contain ephedra or ginseng.  Do not use illegal drugs, such as cocaine and amphetamines.  Do not smoke.  Manage your high blood pressure. SEEK MEDICAL CARE IF:  You notice a change in the rate, rhythm, or strength of your heartbeat.  You are taking an anticoagulant and you notice increased bruising.  You tire more easily when you exercise or exert yourself. SEEK IMMEDIATE MEDICAL CARE IF:  You have chest pain, abdominal pain, sweating, or weakness.  You feel nauseous.  You notice blood in your vomit, bowel movement, or urine.  You have shortness of breath.  You suddenly have swollen feet and ankles.  You feel dizzy.  You have sudden weakness or numbness of the face, arm, or leg, especially on one side of the body.  You have trouble speaking,  trouble understanding, or both (aphasia).  Your face or your eyelid droops on one side. These symptoms may represent a serious problem that is an emergency. Do not wait to see if the symptoms will go away. Get medical help right away. Call your local emergency services (911 in the [place] Do not drive yourself to the hospital.   This information is not intended to replace advice given to you by your health care provider. Make sure you discuss any questions you have with your health care provider.   Document Released: 05/06/2005 Document Revised: 01/25/2015 Document Reviewed: 08/31/2014 Elsevier Interactive Patient Education 2016 [employer]. Syncope Syncope is a medical term for fainting or passing out. This means you lose consciousness and drop to the ground. People are generally unconscious for less than 5 minutes. You may have some muscle twitches for up to 15 seconds before waking up and returning to normal. Syncope occurs more often in older adults, but it can happen to anyone. While most causes of syncope are not dangerous, syncope can be a sign of a serious medical problem. It is important to seek medical care.  CAUSES  Syncope is caused by a sudden drop in blood flow to the brain. The specific cause is often not determined. Factors that can bring on syncope include:  Taking medicines that lower blood pressure.  Sudden changes in posture, such as standing up quickly.  Taking more medicine than prescribed.  Standing in one place for too long.  Seizure disorders.  Dehydration and excessive exposure to heat.  Low blood sugar (hypoglycemia).  Straining to have a bowel movement.  Heart disease, irregular heartbeat, or other circulatory problems.  Fear, emotional distress, seeing blood, or severe pain. SYMPTOMS  Right before fainting, you may:  Feel dizzy or light-headed.  Feel nauseous.  See all white or all black in your field of vision.  Have cold, clammy  skin. DIAGNOSIS  Your health care provider will ask about your symptoms, perform a physical exam, and perform an electrocardiogram (ECG) to record the electrical activity of your heart. Your health care provider may also perform other heart or blood tests to determine the cause of your syncope which may include:  Transthoracic echocardiogram (TTE). During echocardiography, sound waves are used to evaluate how blood flows through your heart.  Transesophageal echocardiogram (TEE).  Cardiac monitoring. This allows your health care provider to monitor your heart rate and rhythm in real time.  Holter monitor. This is a portable device that records your heartbeat and can help diagnose heart arrhythmias. It allows your health care provider to track your heart activity for several days, if needed.  Stress tests by exercise or by giving medicine that makes the heart beat faster. TREATMENT  In most cases, no treatment is needed. Depending on the cause of your syncope, your health care provider may recommend changing or stopping some of your medicines. HOME CARE INSTRUCTIONS  Have someone stay with you until you feel stable.  Do not drive, use machinery, or play sports until your health care provider says it is okay.  Keep all follow-up appointments as directed by your health care provider.  Lie down right away if you start feeling like you might faint. Breathe deeply and steadily. Wait until all the symptoms have passed.  Drink enough fluids to keep your urine clear or pale yellow.  If you are taking blood pressure or heart medicine, get up slowly and take several minutes to sit and then stand. This can reduce dizziness. SEEK IMMEDIATE MEDICAL CARE IF:   You have a severe headache.  You have unusual pain in the chest, abdomen, or back.  You are bleeding from your mouth or rectum, or you have black or tarry stool.  You have an irregular or very fast heartbeat.  You have pain with  breathing.  You have repeated fainting or seizure-like jerking during an episode.  You faint when sitting or lying down.  You have confusion.  You have trouble walking.  You have severe weakness.  You have vision problems. If you fainted, call your local emergency services (911 in [place] Do not drive yourself to the hospital.    This information is not intended to replace advice given to you by your health care provider. Make sure you discuss any questions you have with your health care provider.   Document Released: 05/06/2005 Document Revised: 09/20/2014 Document Reviewed: 07/05/2011 Elsevier Interactive Patient Education 2016 [employer] Coronary Artery Disease, Male Coronary artery disease (CAD) is a process in which the blood vessels of the heart (coronary arteries) become narrow or blocked. The narrowing or blockage can lead to decreased blood flow to the heart muscle (angina). Prolonged reduced blood flow can cause a heart attack (myocardial infarction, MI). Because CAD is the leading cause of death in men, it is important to understand what causes this condition and how it is treated. CAUSES Atherosclerosis is the cause of CAD. This is the buildup of fat and cholesterol (plaque) on the inside of the arteries. Over time, the plaque may  narrow or block the artery, and this will lessen blood flow to the heart. Plaque can also become weak and break off within a coronary artery to form a clot and cause a sudden blockage. RISK FACTORS Many risk factors increase your chances of getting CAD, including:  High cholesterol levels.  High blood pressure (hypertension).  Tobacco use.  Diabetes.  Age. Men over age 16 are at a greater risk of CAD.  Gender. Men often develop CAD earlier in life than women.  Family history of CAD.  Obesity.  Lack of exercise.  A diet high in saturated fats. SYMPTOMS  Many people do not experience any symptoms during the early stages of CAD. As  the condition progresses, symptoms may include:   Chest pain.  The pain can be described as a crushing or squeezing in the chest, or a tightness, pressure, fullness, or heaviness in the chest.  The pain can last more than a few minutes or can stop and recur.  Pain in the arms, neck, jaw, or back.  Unexplained heartburn or indigestion.  Shortness of breath.  Nausea.  Sudden cold sweats. Less common symptoms of CAD in men can include:  Fatigue.  Unexplained feelings of nervousness or anxiety.  Weakness.  Diarrhea.  Sudden light-headedness. DIAGNOSIS  Tests to diagnose CAD may include:  ECG (electrocardiogram).  Exercise stress test. This looks for signs of blockage when the heart is being exercised.  Pharmacologic stress test. This test looks for signs of blockage when the heart is being stressed with a medicine.  Blood tests.  Coronary angiogram. This is a procedure to look at the coronary arteries to see if there is any blockage. TREATMENT The treatment of CAD may include the following:  Healthy behavioral changes to reduce or control risk factors.  Medicine.  Coronary stenting.A stent helps to keep an artery open.  Coronary angioplasty. This procedure widens a narrowed or blocked artery.  Coronary arterybypass surgery. This will allow your blood to pass the blockage (bypass) to reach your heart. HOME CARE INSTRUCTIONS  Take medicines only as directed by your health care provider.  Do not take the following medicines unless your health care provider approves:  Nonsteroidal anti-inflammatory drugs (NSAIDs), such as ibuprofen, naproxen, or celecoxib.  Vitamin supplements that contain vitamin A, vitamin E, or both.  Manage other health conditions such as hypertension and diabetes as directed by your health care provider.  Follow a heart-healthy diet. A dietitian can help to educate you about healthy food options and changes.  Use healthy cooking  methods such as roasting, grilling, broiling, baking, poaching, steaming, or stir-frying. Talk to a dietitian to learn more about healthy cooking methods.  Follow an exercise program approved by your health care provider.  Maintain a healthy weight. Lose weight as approved by your health care provider.  Plan rest periods when fatigued.  Learn to manage stress.  Do not use any tobacco products, including cigarettes, chewing tobacco, or electronic cigarettes. If you need help quitting, ask your health care provider.  If you drink alcohol, and your health care provider approves, limit your alcohol intake to no more than 1 drink per day. One drink equals 12 ounces of beer, 5 ounces of wine, or 1 ounces of hard liquor.  Stop illegal drug use.  Your health care provider may ask you to monitor your blood pressure. A blood pressure reading consists of a higher number over a lower number, such as 110 over 72, which is written as 110/72.  Ideally, your blood pressure should be:  Below 140/90 if you have no other medical conditions.  Below 130/80 if you have diabetes or kidney disease.  Keep all follow-up visits as directed by your health care provider. This is important. SEEK IMMEDIATE MEDICAL CARE IF:  You have pain in your chest, neck, arm, jaw, stomach, or back that lasts more than a few minutes, is recurring, or is unrelieved by taking medicine under your tongue (sublingualnitroglycerin).  You have profuse sweating without cause.  You have unexplained:  Heartburn or indigestion.  Shortness of breath or difficulty breathing.  Nausea or vomiting.  Fatigue.  Feelings of nervousness or anxiety.  Weakness.  Diarrhea.  You have sudden light-headedness or dizziness.  You faint. These symptoms may represent a serious problem that is an emergency. Do not wait to see if the symptoms will go away. Get medical help right away. Call your local emergency services (911 in the [place] Do  not drive yourself to the hospital.   This information is not intended to replace advice given to you by your health care provider. Make sure you discuss any questions you have with your health care provider.   Document Released: 12/01/2013 Document Reviewed: 12/01/2013 Elsevier Interactive Patient Education [employer]

## 2015-12-01 NOTE — Progress Notes (Signed)
Pt. ready for d/c; alert and oriented w/o complaints- just ready to get home per pt. Pt. and daughter explained and given d/c instructions, and daughter signed d/c instructions; pt. accompanied downstairs for d/c via w/c by Sierrah, sec. No problems noted.

## 2015-12-01 NOTE — Discharge Summary (Addendum)
Physician Discharge Summary  Derrick Ramos ZOX:[id number] DOB: 1945/09/16 DOA: 11/21/2015  PCP: Pcp Not In System  Admit date: 11/21/2015 Discharge date: 12/01/2015  Time spent: 45 minutes  Recommendations for Outpatient Follow-up:  Patient will be discharged to home.  Patient will need to follow up with primary care provider within one week of discharge, repeat CBC.  Patient willNeed to follow-up with cardiology on 12/04/2015 at 2:30 PM to pick up his event monitor. He will also need follow-up with cardiology on 12/14/2015 at 10 AM and with Dr. Allyson Sabal, on 01/09/2016 at 10:15 AM for follow-up appointments. Patient should continue medications as prescribed.  Patient should follow a heart healthy diet.   Discharge Diagnoses:  Syncope Anemia of blood loss/ GI bleed/  New onset / Paroxysmal atrial fibrillation: Coronary artery disease  Acute kidney injury Hypertension  Liver Cirrhosis ETOH abuse Left rib pain Abnormal UA Fall and scalp wound Left upper extremity pain Leukocytosis Tobacco abuse Moderate malnutrition  Discharge Condition: Stable  Diet recommendation: heart healthy  Filed Weights   11/29/15 0425 11/30/15 0455 12/01/15 0548  Weight: 59.149 kg (130 lb 6.4 oz) 59 kg (130 lb 1.1 oz) 58.469 kg (128 lb 14.4 oz)    History of present illness:  Derrick Ramos is a 70 y.o. gentleman with a history of cirrhosis (self-reported), daily EtOH use, active tobacco use, PVD, and HTN who presents to the ED accompanied by his wife and daughter who has had recurrent syncopal episodes. The episode today resulted in head trauma and laceration requiring staples in the ED. EKG also shows atrial fibrillation (rate controlled), which is new for him.  He denies chest pain, pressure, or tightness. No shortness of breath. He has had nausea and vomiting and reports dark coffee ground emesis over the past two days. He has had 3-4 episodes. He did not tell his family. He denies melena but says  that his stools have been loose. No abdominal pain, fullness, or new distention. No leg swelling.   Hospital Course:  Syncope -possibly from underlying coronary artery disease/ETOH abuse/ arrhythmias/ orthostatics  -EP consulted and appreciated- recommended cardiac event monitor: pickup scheduled for Monday 12/04/15 at 2:30pm [facility] office).  -Follow up with Dr. Allyson Sabal 01/09/16 at 10:15am   Anemia of blood loss/ GI bleed -Hemoglobin on admission was 15.8 and trended downward to 12.5.  -FOBT + -old records from one year ago. He underwent EGD showing an antral ulcer and antral erosions. Colonoscopy shows tubular adenoma in the rectum and descending colon which were removed. Recommended to continue with BID PPI.  -GI consulted and recommended to get EGD done, showing normal esophagus, gastritis, antral erosions, erythematous duodenopathy . -hemoglobin currently 11.6  New onset / Paroxysmal atrial fibrillation: -Echo shows Systolic function was mildly to moderately reduced. The estimated ejection fraction was in the range of 40% to 45%. There is severe hypokinesis of the entire inferolateral myocardium. There is akinesis of the basalinferior myocardium. The study was not technically sufficient to allow evaluation of LV diastolic dysfunction due to atrial fibrillation. -[place] score is 4.  -Due to ETOH abuse, not a good candidate for anticoagulation.  -Reassess after dual antiplatelet therapy as been completed (30days)  Coronary artery disease  -Cardiology consulted, underwent cath shows 80% LAD stenosis, circumflex and irght coronary are non obstructive.  -Cardiothoracic surgery consulted , recommended EGD prior to CABG. Feels patient is not a candidate for CABG -EGD done on 7/10 showing gastritis and some antral erosions, no frank bleeding ulcers. -S/p LAD  BMS. Continue aspirin and Brilinta for 30 days -Continue statin (close monitoring of LFTs) -F/u appt @ [facility] office -  Thursday 7/27 at 10am with Azalee Course, PA-C  Acute kidney injury -Improved with gentle hydration. Stopped IV fluids.  -Creatinine 1.08  Hypertension  -Not well controlled.  -PRN hydralazine and on amlodipine.  Liver Cirrhosis -Liver function panel wnl.   ETOH abuse -On CIWA protocol.  -No signs of withdrawal.   Left rib pain -Rib series ordered and negative for fracture.  -Pain control .   Abnormal UA -Urine cultures not sent on admission. He is asymptomatic. Denies any dysuria.  -Continue to monitor.  -IV cipro started by CVTS. Completed 4 days of treatment.   Fall and scalp wound -No bleeding.   Left upper extremity pain -suspect due to old IV site -LUE [place] ordered, negative for DVT or superficial vein thrombosis -Continue to monitor  Leukocytosis -Likely reactive -Continue to monitor CBC. Currently afebrile -Denies cough, shortness of breath, problems urinating.   Tobacco abuse -Discussed smoking cessation   Moderate malnutrition -continue feeding supplements  Consultants Gastroenterology Cardiology/EP Cardiothoracic surgery  Procedures  Echocardiogram EGD Cardiac catheterization  Discharge Exam: Filed Vitals:   12/01/15 0548 12/01/15 1007  BP: 131/70 125/61  Pulse: 70 71  Temp: 99 F (37.2 C)   Resp: 20    Exam  General: Well developed, well nourished, NAD  HEENT: NCAT, mucous membranes moist.   Cardiovascular: S1 S2 auscultated, no murmurs, RRR  Respiratory: Clear to auscultation bilaterally with equal chest rise  Abdomen: Soft, nontender, nondistended, + bowel sounds  Extremities: warm dry without cyanosis clubbing or edema  Neuro: AAOx3, nonfocal  Psych: Flat affect  Discharge Instructions      Discharge Instructions    Amb Referral to Cardiac Rehabilitation    Complete by:  As directed   Diagnosis:   PTCA Coronary Stents       Discharge instructions    Complete by:  As directed   Patient will be discharged to  home.  Patient will need to follow up with primary care provider within one week of discharge, repeat CBC.  Patient willNeed to follow-up with cardiology on 12/04/2015 at 2:30 PM to pick up his event monitor. He will also need follow-up with cardiology on 12/14/2015 at 10 AM and with Dr. Allyson Sabal, on 01/09/2016 at 10:15 AM for follow-up appointments. Patient should continue medications as prescribed.  Patient should follow a heart healthy diet.            Medication List    STOP taking these medications        ranitidine 150 MG capsule  Commonly known as:  ZANTAC      TAKE these medications        amLODipine 10 MG tablet  Commonly known as:  NORVASC  Take 1 tablet by mouth daily.     aspirin 81 MG EC tablet  Take 1 tablet (81 mg total) by mouth daily.     atorvastatin 20 MG tablet  Commonly known as:  LIPITOR  Take 1 tablet (20 mg total) by mouth daily at 6 PM.     feeding supplement Liqd  Take 1 Container by mouth 3 (three) times daily between meals.     folic acid 1 MG tablet  Commonly known as:  FOLVITE  Take 1 tablet (1 mg total) by mouth daily.     lisinopril 5 MG tablet  Commonly known as:  PRINIVIL,ZESTRIL  Take 1 tablet (5  mg total) by mouth daily.     loratadine 10 MG tablet  Commonly known as:  CLARITIN  Take 1 tablet by mouth daily.     Metoprolol Tartrate 75 MG Tabs  Take 75 mg by mouth 2 (two) times daily.     multivitamin with minerals Tabs tablet  Take 1 tablet by mouth daily.     nitroGLYCERIN 0.4 MG SL tablet  Commonly known as:  NITROSTAT  Place 1 tablet (0.4 mg total) under the tongue every 5 (five) minutes as needed for chest pain.     pantoprazole 40 MG tablet  Commonly known as:  PROTONIX  Take 1 tablet (40 mg total) by mouth 2 (two) times daily.     potassium chloride SA 20 MEQ tablet  Commonly known as:  K-DUR,KLOR-CON  Take 1 tablet (20 mEq total) by mouth daily.     thiamine 100 MG tablet  Take 1 tablet (100 mg total) by mouth daily.      ticagrelor 90 MG Tabs tablet  Commonly known as:  BRILINTA  Take 1 tablet (90 mg total) by mouth 2 (two) times daily.       Allergies  Allergen Reactions  . No Known Allergies    Follow-up Information    Follow up with [facility].   Specialty:  Cardiology   Why:  [facility] - please pick up heart monitor on Monday 12/04/15 at 2:30pm - [address] LOCATION   Contact information:   [address] [place] [address] [phone]      Follow up with Azalee Course, PA.   Specialties:  Cardiology, Radiology   Why:  [facility] - follow-up appointment with one of our PAs on Thursday 12/14/15 at [facility] LOCATION   Contact information:   [address] [place] [address] [phone]       Follow up with [name], MD.   Specialties:  Cardiology, Radiology   Why:  [facility] - 01/09/16 at 10:15am - This will be at the [facility] information:   [address] [place] [address] [phone]       Follow up with Primary care physician. Schedule an appointment as soon as possible for a visit in 1 week.   Why:  Hospital follow up, repeat CBC       The results of significant diagnostics from this hospitalization (including imaging, microbiology, ancillary and laboratory) are listed below for reference.    Significant Diagnostic Studies: Dg Chest 2 View  12/01/2015  CLINICAL DATA:  Leukocytosis. History of cirrhosis. Recurrent syncopal episodes. EXAM: CHEST  2 VIEW COMPARISON:  Chest CT 11/24/2015 and chest x-ray 11/21/2015 FINDINGS: The cardiac silhouette, mediastinal and hilar contours are within normal limits and stable. There is tortuosity and calcification of the thoracic aorta. 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 No acute overlying pulmonary process. No pleural effusion. The bony thorax is intact.  IMPRESSION: No acute cardiopulmonary findings. Calcified pleural plaques. Tortuosity and calcification of the thoracic aorta is stable. Electronically Signed   By: Rudie Meyer M.D.   On: 12/01/2015 09:39   Dg Chest 2 View  11/21/2015  CLINICAL DATA:  Multiple syncopal episodes. Patient fell, striking head. Initial encounter. EXAM: CHEST  2 VIEW COMPARISON:  None. FINDINGS: 1513 hours. The heart is mildly enlarged. There is aortic tortuosity. The lungs are mildly hyperinflated but clear.  There is some calcification along the right hemidiaphragm. No pleural effusion or pneumothorax is seen. Mild degenerative changes are present throughout the spine. IMPRESSION: No active cardiopulmonary process or acute posttraumatic findings demonstrated. Mild cardiomegaly. Electronically Signed   By: Carey Bullocks M.D.   On: 11/21/2015 15:12   Dg Ribs Unilateral Left  11/22/2015  CLINICAL DATA:  Fall on left side 2 days ago, left lower rib pain EXAM: LEFT RIBS - 2 VIEW COMPARISON:  11/21/2015 FINDINGS: Two views left ribs submitted. No infiltrate or pulmonary edema. No rib fracture is identified. There is no pneumothorax. IMPRESSION: Negative. Electronically Signed   By: Natasha Mead M.D.   On: 11/22/2015 12:38   Ct Head Wo Contrast  11/21/2015  CLINICAL DATA:  Fall, neck pain EXAM: CT HEAD WITHOUT CONTRAST CT CERVICAL SPINE WITHOUT CONTRAST TECHNIQUE: Multidetector CT imaging of the head and cervical spine was performed following the standard protocol without intravenous contrast. Multiplanar CT image reconstructions of the cervical spine were also generated. COMPARISON:  None. FINDINGS: CT HEAD FINDINGS No skull fracture is noted. Paranasal sinuses and mastoid air cells are unremarkable. Atherosclerotic calcifications of carotid siphon are noted. No intracranial hemorrhage, mass effect or midline shift. Moderate cerebral atrophy. Mild periventricular and patchy subcortical white matter decreased attenuation probable due to  chronic small vessel ischemic changes. No acute cortical infarction. No mass lesion is noted on this unenhanced scan. CT CERVICAL SPINE FINDINGS Axial images of the cervical spine shows no acute fracture or subluxation. Atherosclerotic calcifications of vertebral arteries are noted. Computer processed images shows no acute fracture or subluxation. Mild degenerative changes C1-C2 articulation. Mild posterior disc bulge at C3-C4 level. Mild anterior spurring lower endplate of C4 and C5 vertebral body. Mild disc space flattening with minimal posterior disc bulge as at C6-C7 level. No prevertebral soft tissue swelling. Cervical airway is patent. There is no pneumothorax in visualized lung apices. IMPRESSION: 1. No acute intracranial abnormality. There is moderate cerebral atrophy. Mild periventricular and patchy subcortical white matter decreased attenuation probable due to chronic small vessel ischemic changes. 2. No cervical spine acute fracture or subluxation. Mild degenerative changes as described above. Electronically Signed   By: Natasha Mead M.D.   On: 11/21/2015 15:09   Ct Chest Wo Contrast  11/24/2015  CLINICAL DATA:  CHF EXAM: CT CHEST WITHOUT CONTRAST TECHNIQUE: Multidetector CT imaging of the chest was performed following the standard protocol without IV contrast. COMPARISON:  Chest radiographs dated 11/21/2015 FINDINGS: Cardiovascular: The heart is normal in size. No pericardial effusion. Three vessel coronary atherosclerosis. Atherosclerotic calcifications of the aortic arch. Mediastinum/Nodes: No suspicious mediastinal lymphadenopathy. No evidence of axillary lymphadenopathy. Visualized thyroid is unremarkable. Lungs/Pleura: Small bilateral pleural effusions, left greater than right. Associated mild patchy bilateral lower lobe opacities, likely atelectasis. No frank interstitial edema. No suspicious pulmonary nodules. Pleural-based calcifications along the right hemithorax. Mild pleural-based nodularity  along the fissures bilaterally. Mild emphysematous changes No pneumothorax. Upper Abdomen: Visualized upper abdomen is notable for contrast within the bilateral renal collecting systems, likely related to recent cardiac catheterization. Musculoskeletal: Degenerative changes of the visualized thoracolumbar spine. IMPRESSION: Small bilateral pleural effusions, left greater than right. Associated mild patchy bilateral lower lobe opacities, likely atelectasis. No frank interstitial edema. Electronically Signed   By: Charline Bills M.D.   On: 11/24/2015 21:29   Ct Cervical Spine Wo Contrast  11/21/2015  CLINICAL DATA:  Fall, neck pain EXAM: CT HEAD WITHOUT CONTRAST CT CERVICAL SPINE WITHOUT CONTRAST TECHNIQUE: Multidetector CT imaging of the head  and cervical spine was performed following the standard protocol without intravenous contrast. Multiplanar CT image reconstructions of the cervical spine were also generated. COMPARISON:  None. FINDINGS: CT HEAD FINDINGS No skull fracture is noted. Paranasal sinuses and mastoid air cells are unremarkable. Atherosclerotic calcifications of carotid siphon are noted. No intracranial hemorrhage, mass effect or midline shift. Moderate cerebral atrophy. Mild periventricular and patchy subcortical white matter decreased attenuation probable due to chronic small vessel ischemic changes. No acute cortical infarction. No mass lesion is noted on this unenhanced scan. CT CERVICAL SPINE FINDINGS Axial images of the cervical spine shows no acute fracture or subluxation. Atherosclerotic calcifications of vertebral arteries are noted. Computer processed images shows no acute fracture or subluxation. Mild degenerative changes C1-C2 articulation. Mild posterior disc bulge at C3-C4 level. Mild anterior spurring lower endplate of C4 and C5 vertebral body. Mild disc space flattening with minimal posterior disc bulge as at C6-C7 level. No prevertebral soft tissue swelling. Cervical airway is  patent. There is no pneumothorax in visualized lung apices. IMPRESSION: 1. No acute intracranial abnormality. There is moderate cerebral atrophy. Mild periventricular and patchy subcortical white matter decreased attenuation probable due to chronic small vessel ischemic changes. 2. No cervical spine acute fracture or subluxation. Mild degenerative changes as described above. Electronically Signed   By: Natasha Mead M.D.   On: 11/21/2015 15:09   US Renal  11/21/2015  CLINICAL DATA:  Acute kidney injury. EXAM: RENAL / URINARY TRACT ULTRASOUND COMPLETE COMPARISON:  None. FINDINGS: Right Kidney: Length: 11.3 cm. Echogenicity within normal limits. No mass or hydronephrosis visualized. Left Kidney: Length: 11.4 cm. Echogenicity within normal limits. No mass or hydronephrosis visualized. Bladder: Appears normal for degree of bladder distention. IMPRESSION: Normal exam. Electronically Signed   By: Signa Kell M.D.   On: 11/21/2015 19:22    Microbiology: Recent Results (from the past 240 hour(s))  Surgical pcr screen     Status: None   Collection Time: 11/26/15 10:02 PM  Result Value Ref Range Status   MRSA, PCR NEGATIVE NEGATIVE Final   Staphylococcus aureus NEGATIVE NEGATIVE Final    Comment:        The Xpert SA Assay (FDA approved for NASAL specimens in patients over 55 years of age), is one component of a comprehensive surveillance program.  Test performance has been validated by [facility] for patients greater than or equal to 19 year old. It is not intended to diagnose infection nor to guide or monitor treatment.      Labs: Basic Metabolic Panel:  Recent Labs Lab 11/27/15 0709 11/27/15 0843 11/28/15 1106 11/29/15 0327 11/30/15 0351 12/01/15 1058  NA 135 139 137 138 136 135  K 2.8* 3.5 3.3* 3.3* 4.0 3.2*  CL 101  --  102 103 105 105  CO2 24  --  27 27 22 22   GLUCOSE 97 92 105* 93 94 129*  BUN <5*  --  <5* 5* 6 8  CREATININE 0.85  --  0.99 1.04 1.08 1.01  CALCIUM 8.2*  --   8.1* 8.0* 8.4* 8.7*  MG  --   --   --  0.8* 1.7  --    Liver Function Tests:  Recent Labs Lab 11/25/15 0709  AST 17  ALT 13*  ALKPHOS 71  BILITOT 0.8  PROT 5.5*  ALBUMIN 2.6*   No results for input(s): LIPASE, AMYLASE in the last 168 hours.  Recent Labs Lab 11/25/15 0709  AMMONIA 17   CBC:  Recent Labs Lab 11/25/15  7673  11/27/15 1847 11/28/15 0630 11/29/15 0327 11/30/15 0351 12/01/15 0414  WBC 7.4  --   --   --  10.7* 15.4* 15.4*  HGB 12.7*  < > 12.7* 12.6* 12.1* 12.3* 11.6*  HCT 38.8*  < > 37.3* 37.2* 36.9* 36.4* 34.7*  MCV 96.8  --   --   --  96.1 96.3 95.9  PLT 160  166  --   --   --  282 298 334  < > = values in this interval not displayed. Cardiac Enzymes: No results for input(s): CKTOTAL, CKMB, CKMBINDEX, TROPONINI in the last 168 hours. BNP: BNP (last 3 results) No results for input(s): BNP in the last 8760 hours.  ProBNP (last 3 results) No results for input(s): PROBNP in the last 8760 hours.  CBG:  Recent Labs Lab 11/30/15 1602  GLUCAP 104*       Signed:  Edsel Petrin  [facility] 12/01/2015, 12:42 PM

## 2015-12-04 ENCOUNTER — Ambulatory Visit (INDEPENDENT_AMBULATORY_CARE_PROVIDER_SITE_OTHER): Payer: Medicare Other

## 2015-12-04 DIAGNOSIS — R55 Syncope and collapse: Secondary | ICD-10-CM

## 2015-12-07 ENCOUNTER — Telehealth: Payer: Self-pay | Admitting: Physician Assistant

## 2015-12-07 NOTE — Telephone Encounter (Signed)
Called pt and left message for pt to call back to update Fm and medical Hx. °

## 2015-12-13 ENCOUNTER — Encounter: Payer: Self-pay | Admitting: *Deleted

## 2015-12-14 ENCOUNTER — Encounter: Payer: Self-pay | Admitting: Physician Assistant

## 2015-12-14 ENCOUNTER — Ambulatory Visit (INDEPENDENT_AMBULATORY_CARE_PROVIDER_SITE_OTHER): Payer: Medicare Other | Admitting: Physician Assistant

## 2015-12-14 VITALS — BP 142/68 | HR 74 | Ht 66.0 in | Wt 127.6 lb

## 2015-12-14 DIAGNOSIS — R55 Syncope and collapse: Secondary | ICD-10-CM

## 2015-12-14 DIAGNOSIS — I480 Paroxysmal atrial fibrillation: Secondary | ICD-10-CM | POA: Diagnosis not present

## 2015-12-14 DIAGNOSIS — I10 Essential (primary) hypertension: Secondary | ICD-10-CM | POA: Diagnosis not present

## 2015-12-14 DIAGNOSIS — I2510 Atherosclerotic heart disease of native coronary artery without angina pectoris: Secondary | ICD-10-CM | POA: Diagnosis not present

## 2015-12-14 DIAGNOSIS — E876 Hypokalemia: Secondary | ICD-10-CM

## 2015-12-14 DIAGNOSIS — D649 Anemia, unspecified: Secondary | ICD-10-CM

## 2015-12-14 LAB — BASIC METABOLIC PANEL
BUN: 26 mg/dL — AB (ref 7–25)
CHLORIDE: 105 mmol/L (ref 98–110)
CO2: 18 mmol/L — AB (ref 20–31)
CREATININE: 2 mg/dL — AB (ref 0.70–1.18)
Calcium: 9.1 mg/dL (ref 8.6–10.3)
GLUCOSE: 77 mg/dL (ref 65–99)
POTASSIUM: 5.3 mmol/L (ref 3.5–5.3)
Sodium: 134 mmol/L — ABNORMAL LOW (ref 135–146)

## 2015-12-14 LAB — CBC
HCT: 31.7 % — ABNORMAL LOW (ref 38.5–50.0)
Hemoglobin: 10.3 g/dL — ABNORMAL LOW (ref 13.2–17.1)
MCH: 30.9 pg (ref 27.0–33.0)
MCHC: 32.5 g/dL (ref 32.0–36.0)
MCV: 95.2 fL (ref 80.0–100.0)
MPV: 9.4 fL (ref 7.5–12.5)
PLATELETS: 490 10*3/uL — AB (ref 140–400)
RBC: 3.33 MIL/uL — AB (ref 4.20–5.80)
RDW: 13.5 % (ref 11.0–15.0)
WBC: 8.2 10*3/uL (ref 3.8–10.8)

## 2015-12-14 NOTE — Patient Instructions (Signed)
Medication Instructions:  Your physician recommends that you continue on your current medications as directed. Please refer to the Current Medication list given to you today.  Labwork: TODAY: BMET AND CBC  Testing/Procedures: NONE  Follow-Up: Your physician recommends that you schedule a follow-up WITH DR BERRY AS SCHEDULED.  Any Other Special Instructions Will Be Listed Below (If Applicable).  A REQUEST TO RECEIVE RECORDS FROM [facility] FROM DR CRUZ FOR YOUR ARTERIAL ULTRASOUND HAS BEEN FILLED OUT   If you need a refill on your cardiac medications before your next appointment, please call your pharmacy.

## 2015-12-14 NOTE — Progress Notes (Signed)
Cardiology Office Note    Date:  12/14/2015   ID:  Derrick Ramos, DOB 10-25-1945, MRN [id number]  PCP:  Renold Don, MD  Cardiologist:  Dr. Allyson Sabal   Chief Complaint  Patient presents with  . Hospitalization Follow-up    seen for Dr. Allyson Sabal    History of Present Illness:  Derrick Ramos is a 70 y.o. male with past medical history of hypertension, self-reported cirrhosis, daily alcohol use, tobacco abuse, and PAD who was brought to the hospital on 11/21/2015 after having 2 syncope. He was recently diagnosed with severe lower extremity arterial atherosclerosis with intermittent claudication. On the day of arrival, he had a first episode of syncope when he was walking to his front door. When he woke up by EMS, he initially declined to come in for further evaluation, however when he tried to stand up, he passed out again. He had head trauma requiring staples in the ED. CT of head and cervical spine did not show any acute abnormality. He was also seen by GI for dark emesis and a positive stool guaiac. EKG on presentation showed atrial fibrillation with heart rate of 80s. Echocardiogram obtained showed EF 40-45%, severe hypokinesis of the entire inferolateral myocardium, akinesis of basal inferior myocardium, mild AR, mildly dilated left atrium. Creatinine was initially elevated however later improved with hydration.   Given the LV dysfunction, it was recommended for him to undergo cardiac catheterization. He was also seen by Dr. Elberta Fortis with electrophysiology on 7/6 who recommended event monitor to evaluate arrhythmogenic causes of syncope. Patient underwent cardiac catheterization on 11/24/2015 which showed 80% ostial to proximal LAD lesion, 50% ostial D1 lesion, 60% mid LAD lesion, 25% proximal left circumflex lesion. He was considered by [facility] for LIMA to LAD bypass surgery, due to the requirement of blood thinners and his drop hemoglobin/hematocrit, GI attempted endoscopy on 11/27/2015  which showed erosive gastropathy, however no other bleeding sources, recommended continue PPI indefinitely as long as he take anticoagulation or antiplatelet therapy. He was seen again by Dr. Donata Clay with cardiothoracic surgery on 7/11, it was felt that he would not be a candidate for general anesthesia and cardiac surgery based on single vessel CAD and also his cirrhosis and extremely heavy alcohol history. After he was turned down by cardiothoracic surgery, he underwent PCI of LAD on 7/12 with bare-metal stent. Postprocedure, it was felt that given his risk of bleeding is too high on for systemic anticoagulation therapy on top of DAPT. Therefore it was recommended to reassess anticoagulation after stopping DAPT based on event monitor result. Patient was discharged on 7/14.  Since he left the hospital, he has been doing relatively well. He denies any chest discomfort or shortness breath. He is no longer drinking. He also quit smoking on July 4. I have discussed his recent PFT result with him and encouraged him to stop smoking. EKG obtained in the office today does not show any significant ST-T wave changes. He is still wearing the monitor for assessment of syncope and to check for recurrent atrial fibrillation. He has not experience any dizziness, presyncope or syncope since hospitalization. He has been compliant with dual antiplatelet therapy. His blood pressure is high today 142/68. I have advised him to obtain daily BP, if BP still high on recheck, we will increase the lisinopril to 10 mg daily. According to the patient, he was previously seen by Dr. Jamison Neighbor with [facility] vascular surgery for PAD, I am unable to see the previous lower extremity  arterial Doppler. We will request records from Dr. Crista Curb office. He will review event monitor result with Dr. Allyson Sabal on follow-up and also depend on his A. fib burden, he will also discussed with Dr. Allyson Sabal if need systemic anticoagulation therapy. Otherwise he  has been doing very well. He does have some hypokalemia in the hospital, I will obtain a BMET today. I will also obtain a CBC given the need for DAPT.    Past Medical History:  Diagnosis Date  . Acute kidney injury (HCC)    [name] 11/21/2015  . Atrial fibrillation (HCC)    [name] 11/21/2015  . GERD (gastroesophageal reflux disease)   . GI bleed 2016   [name] 11/21/2015  . H/O syncope    recurrent episodes/notes 11/21/2015  . Hematemesis    [name] 11/21/2015  . Hepatic cirrhosis (HCC)    [name] 11/21/2015  . History of blood transfusion 2016   related to GI bleed/notes 11/21/2015  . Hypertension   . PVD (peripheral vascular disease) (HCC)    [name] 11/21/2015  . Syncope and collapse    required staples to back of head/notes 11/21/2015    Past Surgical History:  Procedure Laterality Date  . CARDIAC CATHETERIZATION N/A 11/24/2015   Procedure: Left Heart Cath and Coronary Angiography;  Surgeon: Lyn Records, MD;  Location: [facility] INVASIVE CV LAB;  Service: Cardiovascular;  Laterality: N/A;  . CARDIAC CATHETERIZATION N/A 11/29/2015   Procedure: Coronary Stent Intervention ;  Surgeon: Kathleene Hazel, MD;  Location: [facility] INVASIVE CV LAB;  Service: Cardiovascular;  Laterality: N/A;  . COLONOSCOPY    . ESOPHAGOGASTRODUODENOSCOPY    . ESOPHAGOGASTRODUODENOSCOPY (EGD) WITH PROPOFOL N/A 11/27/2015   Procedure: ESOPHAGOGASTRODUODENOSCOPY (EGD) WITH PROPOFOL;  Surgeon: Willis Modena, MD;  Location: [facility] ENDOSCOPY;  Service: Endoscopy;  Laterality: N/A;    Current Medications: Outpatient Medications Prior to Visit  Medication Sig Dispense Refill  . amLODipine (NORVASC) 10 MG tablet Take 1 tablet by mouth daily.    Marland Kitchen aspirin EC 81 MG EC tablet Take 1 tablet (81 mg total) by mouth daily. 30 tablet 0  . atorvastatin (LIPITOR) 20 MG tablet Take 1 tablet (20 mg total) by mouth daily at 6 PM. 30 tablet 0  . feeding supplement (BOOST / RESOURCE BREEZE) LIQD Take 1 Container by mouth 3 (three) times daily between  meals. 90 Container 0  . folic acid (FOLVITE) 1 MG tablet Take 1 tablet (1 mg total) by mouth daily. 30 tablet 0  . lisinopril (PRINIVIL,ZESTRIL) 5 MG tablet Take 1 tablet (5 mg total) by mouth daily. 30 tablet 0  . loratadine (CLARITIN) 10 MG tablet Take 1 tablet by mouth daily.    . metoprolol tartrate 75 MG TABS Take 75 mg by mouth 2 (two) times daily. 60 tablet 0  . Multiple Vitamin (MULTIVITAMIN WITH MINERALS) TABS tablet Take 1 tablet by mouth daily. 30 tablet 0  . nitroGLYCERIN (NITROSTAT) 0.4 MG SL tablet Place 1 tablet (0.4 mg total) under the tongue every 5 (five) minutes as needed for chest pain. 30 tablet 0  . pantoprazole (PROTONIX) 40 MG tablet Take 1 tablet (40 mg total) by mouth 2 (two) times daily. 60 tablet 0  . potassium chloride SA (K-DUR,KLOR-CON) 20 MEQ tablet Take 1 tablet (20 mEq total) by mouth daily. 30 tablet 0  . thiamine 100 MG tablet Take 1 tablet (100 mg total) by mouth daily. 30 tablet 0  . ticagrelor (BRILINTA) 90 MG TABS tablet Take 1 tablet (90 mg total) by mouth  2 (two) times daily. 60 tablet 0   No facility-administered medications prior to visit.      Allergies:   No known allergies   Social History   Social History  . Marital status: Married    Spouse name: N/A  . Number of children: N/A  . Years of education: N/A   Social History Main Topics  . Smoking status: Former Smoker    Packs/day: 0.12    Years: 55.00    Types: Cigarettes    Quit date: 11/21/2015  . Smokeless tobacco: Never Used  . Alcohol use 156.6 oz/week    93 Glasses of wine, 168 Cans of beer per week     Comment: 11/21/2015 daily, 2 - 6 pack tall cans daily beer and gallon wine 2-3 days  . Drug use: No  . Sexual activity: No   Other Topics Concern  . None   Social History Narrative  . None     Family History:  The patient's family history includes Cancer in his brother and mother; Chronic Renal Failure in his sister; Diabetes in his sister.   ROS:   Please see the history  of present illness.    ROS All other systems reviewed and are negative.   PHYSICAL EXAM:   VS:  BP (!) 142/68   Pulse 74   Ht 5\' 6"  (1.676 m)   Wt 127 lb 9.6 oz (57.9 kg)   SpO2 94%   BMI 20.60 kg/m    GEN: Well nourished, well developed, in no acute distress  HEENT: normal  Neck: no JVD, carotid bruits, or masses Cardiac: RRR; no murmurs, rubs, or gallops,no edema  Respiratory:  clear to auscultation bilaterally, normal work of breathing GI: soft, nontender, nondistended, + BS MS: no deformity or atrophy  Skin: warm and dry, no rash Neuro:  Alert and Oriented x 3, Strength and sensation are intact Psych: euthymic mood, full affect  Wt Readings from Last 3 Encounters:  12/14/15 127 lb 9.6 oz (57.9 kg)  12/01/15 128 lb 14.4 oz (58.5 kg)      Studies/Labs Reviewed:   EKG:  EKG is ordered today.  The ekg ordered today demonstrates NSR with TWI in V5-V6  Recent Labs: 11/25/2015: ALT 13; TSH 1.401 12/01/2015: BUN 8; Creatinine, Ser 1.01; Hemoglobin 11.6; Magnesium 1.6; Platelets 334; Potassium 3.2; Sodium 135   Lipid Panel    Component Value Date/Time   CHOL 117 11/28/2015 1106   TRIG 73 11/28/2015 1106   HDL 42 11/28/2015 1106   CHOLHDL 2.8 11/28/2015 1106   VLDL 15 11/28/2015 1106   LDLCALC 60 11/28/2015 1106    Additional studies/ records that were reviewed today include:    Echo 11/22/2015 LV EF: 40% -   45%  ------------------------------------------------------------------- Indications:      Atrial fibrillation - 427.31.  ------------------------------------------------------------------- History:   PMH:  Syncope. Atrial fibrillation. AKI. Hypertension. H/O cirrhosis. Daily smoker. Daily drinker.  ------------------------------------------------------------------- Study Conclusions  - Left ventricle: The cavity size was normal. Systolic function was   mildly to moderately reduced. The estimated ejection fraction was   in the range of 40% to 45%. There  is severe hypokinesis of the   entireinferolateral myocardium. There is akinesis of the   basalinferior myocardium. The study was not technically   sufficient to allow evaluation of LV diastolic dysfunction due to   atrial fibrillation. - Aortic valve: Moderate diffuse thickening and calcification.   There was mild regurgitation. - Mitral valve: Calcified annulus. There was  mild regurgitation. - Left atrium: The atrium was mildly dilated.   Cath 11/24/2015 Conclusion   1. Ost 1st Diag to 1st Diag lesion, 50% stenosed. 2. Ost LAD to Prox LAD lesion, 80% stenosed. 3. Mid LAD lesion, 60% stenosed. 4. Prox Cx to Dist Cx lesion, 25% stenosed.    Ostial to proximal calcified eccentric 75-80% LAD stenosis. LAD is large and wraps around the left ventricular apex.  Circumflex and right coronary nonobstructive minimal atherosclerosis.  Inferobasal akinesis. Ejection fraction 45-50%. Normal left ventricular filling pressures.  RECOMMENDATIONS:   Best treatment option in this patient would be off pump LIMA to LAD but he may not be considered a surgical candidate because of his lifestyle which includes heavy alcohol drinking and cirrhosis. Interventional approach would be rotational atherectomy followed by stenting into the left main. Unfortunately, with his lifestyle and potential for GI bleeding, long-term antiplatelet therapy will be a problem from the standpoint of compliance and or bleeding.  We will discuss with treating team.  Needs GI workup, which I think will be safe to do. The LAD lesion is severe but not unstable. He actually has no symptoms that would suggest angina/ischemia at this time as best I can tell. The concern that led to coronary angiography was the finding of an inferobasal wall motion abnormality on echocardiography.  Aggressive risk factor modification as tolerated.     Endoscopy 11/27/2015 - Normal esophagus. - Gastritis. - Erosive gastropathy. - Erythematous  duodenopathy. - The examination was otherwise normal. Doubt there is any significance to the erythema and erosions seen on endoscopy, and doubt there is any significant risk for bleeding from these areas. Impression: - Return patient to hospital ward for ongoing care. - Clear liquid diet today. - Continue present medications. - Continue PPI (Protonix 40 mg po qd, or the equivalent) indefinitely, so long as patient is on anticoagulation or antiplatelet thereapy.   Cath 11/29/2015 Conclusion   1. Severe stenosis ostium of the large caliber LAD.  2. Successful PTCA/bare metal stent placement x 1 ostium of the LAD  Note: A full diagnostic catheterization was not repeated today. For details of the stenoses present in the remainder of the LAD, Circumflex and RCA, please refer to the Diagnostic report from 11/24/15.   Recommendations: Continue ASA and Brilinta for at least one month but preferably longer if he tolerates well. Continue statin and beta blocker.       ASSESSMENT:    1. Coronary artery disease involving native coronary artery of native heart without angina pectoris   2. Hypokalemia   3. Paroxysmal atrial fibrillation (HCC)   4. Essential hypertension   5. Anemia, unspecified anemia type   6. Syncope, unspecified syncope type      PLAN:  In order of problems listed above:  1. CAD s/p BMS to LAD  - Denies any chest pain or shortness breath. EKG was nonischemic. Emphasized on the need for DAPT given BMS, will defer to Dr. Allyson Sabal to duration of aspirin and brilinta therapy.  2. Transient PAF not on systemic anticoagulation due to bleeding risk  - CHA2DS2-Vasc score 3 (age, CAD, HTN)  - not placed on systemic anticoagulation given the need for DAPT, recent drop in HCT, cirrhosis, ?compliance  - will reassess afib burden on 30 event monitor, Dr. Allyson Sabal to readdress systemic anticuagulation on followup  3. Anemia with gastritis: EGD on recent admission, on PPI indefinitely  as long as he is on blood thinner  - recent CBC given on DAPT  4. H/o EtOH abuse with self reported cirrhosis: quit since recent hospitalization  5. H/o Syncope: seen by Dr. Elberta Fortis, on event monitor  6. PAD: seen by Dr. Jamison Neighbor with [facility], will request record on recent LE doppler  7. Tobacco abuse: he has quit since recent hospitalization, I have repeated discussed with him association of smoking with CAD and PAD.   8. Hypokalemia: K 3.2, will recheck BMET   Medication Adjustments/Labs and Tests Ordered: Current medicines are reviewed at length with the patient today.  Concerns regarding medicines are outlined above.  Medication changes, Labs and Tests ordered today are listed in the Patient Instructions below. Patient Instructions  Medication Instructions:  Your physician recommends that you continue on your current medications as directed. Please refer to the Current Medication list given to you today.  Labwork: TODAY: BMET AND CBC  Testing/Procedures: NONE  Follow-Up: Your physician recommends that you schedule a follow-up WITH DR BERRY AS SCHEDULED.  Any Other Special Instructions Will Be Listed Below (If Applicable).  A REQUEST TO RECEIVE RECORDS FROM [facility] FROM DR CRUZ FOR YOUR ARTERIAL ULTRASOUND HAS BEEN FILLED OUT   If you need a refill on your cardiac medications before your next appointment, please call your pharmacy.      Ramond Dial, [place]  12/14/2015 1:38 PM    [facility] [address] [place], [place], [place]  [address] Phone: [phone]; Fax: [phone]

## 2015-12-15 ENCOUNTER — Telehealth: Payer: Self-pay | Admitting: Cardiovascular Disease

## 2015-12-15 NOTE — Telephone Encounter (Signed)
Continue current medications and will review prescription and medication needs at his next office visit

## 2015-12-15 NOTE — Telephone Encounter (Signed)
New message       Pt c/o medication issue:  1. Name of Medication: Metoprolol  2. How are you currently taking this medication (dosage and times per day)? 25 mg po twice daily before the hospital adm, then 75 mg po after the discharge from the hospital  3. Are you having a reaction (difficulty breathing--STAT)? no  4. What is your medication issue? The daughter is confused on which medication her Dad is suppose to take    The blood pressure was fine yesterday

## 2015-12-15 NOTE — Telephone Encounter (Signed)
Caller has been made aware of recommendations and voiced understanding.

## 2015-12-15 NOTE — Telephone Encounter (Signed)
Spoke to daughter, DPR on file.  She reports that the patient has been taking everything on medication list since his hospital discharge 2 weeks ago, with exception of the metoprolol. She states he was initially on metoprolol tart 25mg  BID, then 75mg  BID was prescribed on hospital discharge but that the prescription was not sent to patient's regular pharmacy and he never picked up.  Daughter became aware of the mistake today and called their regular pharmacy and got this fixed, called [place] to make [place] aware that he's not been on this med.  She is unsure of what to do - if we recommend starting med and if so, what dose to give to the patient. Notes that she does not have any BP or HR readings from home but notes he was seen by [facility] yesterday in office.  I have made her aware I will have to submit this to provider to review.  Pt has return appt sched w Dr. Allyson Sabal on 8/22.

## 2015-12-19 ENCOUNTER — Telehealth: Payer: Self-pay | Admitting: *Deleted

## 2015-12-19 ENCOUNTER — Other Ambulatory Visit: Payer: Self-pay | Admitting: *Deleted

## 2015-12-19 ENCOUNTER — Encounter: Payer: Self-pay | Admitting: *Deleted

## 2015-12-19 ENCOUNTER — Other Ambulatory Visit: Payer: Medicare Other | Admitting: *Deleted

## 2015-12-19 DIAGNOSIS — Z79899 Other long term (current) drug therapy: Secondary | ICD-10-CM

## 2015-12-19 DIAGNOSIS — R079 Chest pain, unspecified: Secondary | ICD-10-CM

## 2015-12-19 LAB — BASIC METABOLIC PANEL
BUN: 11 mg/dL (ref 7–25)
CHLORIDE: 100 mmol/L (ref 98–110)
CO2: 20 mmol/L (ref 20–31)
CREATININE: 1.2 mg/dL — AB (ref 0.70–1.18)
Calcium: 9 mg/dL (ref 8.6–10.3)
GLUCOSE: 68 mg/dL (ref 65–99)
Potassium: 4.2 mmol/L (ref 3.5–5.3)
Sodium: 130 mmol/L — ABNORMAL LOW (ref 135–146)

## 2015-12-19 MED ORDER — METOPROLOL TARTRATE 50 MG PO TABS: 75.0000 mg | ORAL_TABLET | Freq: Two times a day (BID) | ORAL | 3 refills | Status: DC

## 2015-12-19 NOTE — Telephone Encounter (Signed)
-----   Message from [place], [place] sent at 12/19/2015  8:30 AM EDT ----- Please advise patient to restart at discharge dose of 75mg  BID. We will followup on repeat BMET I recommended, if still worsening, he will be referred to a kidney doctor, if it gets better, then we can hold off.

## 2015-12-19 NOTE — Telephone Encounter (Signed)
Patient in today for lab work.  I was called out to lobby to provide work note for patient's daughter.  While there she asked about metoprolol.  Pt has not been taking since dc from hospital.  He was on 25 mg BID prior to admission, sent home on 75 mg BID.  Today: BP 144/72, HR 64.  Per notes recorded by Azalee Course, PA, I advised patient and his daughter that he should restart metoprolol 75 mg twice daily.  He will take at the same time he takes Brilinta.  Strongly advised patient and daughter to obtain BP cuff and check BP/HR at home once a day.

## 2015-12-19 NOTE — Telephone Encounter (Signed)
Take 25 mg twice a day of metoprolol  as she is currently doing until she is seen back in the office for further evaluation

## 2015-12-19 NOTE — Telephone Encounter (Signed)
Notes Recorded by Azalee Course, PA on 12/19/2015 at 8:30 AM EDT Please advise patient to restart at discharge dose of 75mg  BID. We will followup on repeat BMET I recommended, if still worsening, he will be referred to a kidney doctor, if it gets better, then we can hold off. ------  Notes Recorded by Elliot Cousin, RMA on 12/15/2015 at 1:12 PM EDT Pt daughter, Larita F[place], [place] on file, has been made aware of pts lab results. She didn't notice until yesterday that her dad hasn't been taking any metoprolol since he came home from the hospital because they sent it into the wrong pharmacy and when she picked up his meds it was not included. Now, since we are d/c lisinopril, does ;pt need to start metoprolol? If so, what dose?  Please advise! ------  Notes Recorded by Azalee Course, PA on 12/15/2015 at 12:28 PM EDT Kidney function significant worsened, Cr jumped from 1.0 up to 2.0. Recommend discontinue lisinopril and potassium chloride supplement. Recheck BMET next Tuesday, hydrate during mean time, if still high, will refer to nephrology.

## 2015-12-20 MED ORDER — METOPROLOL TARTRATE 50 MG PO TABS: 25.0000 mg | ORAL_TABLET | Freq: Two times a day (BID) | ORAL | 3 refills | Status: DC

## 2015-12-20 NOTE — Telephone Encounter (Signed)
-----   Message from [place], [place] sent at 12/19/2015  6:12 PM EDT ----- Kidney function has significantly improved, essentially back to baseline. Continue current medication. Monitor blood pressure as previously instructed.

## 2015-12-26 ENCOUNTER — Other Ambulatory Visit: Payer: Self-pay | Admitting: *Deleted

## 2015-12-26 MED ORDER — ATORVASTATIN CALCIUM 20 MG PO TABS: 20.0000 mg | ORAL_TABLET | Freq: Every day | ORAL | 0 refills | Status: DC

## 2015-12-26 MED ORDER — AMLODIPINE BESYLATE 10 MG PO TABS: 10.0000 mg | ORAL_TABLET | Freq: Every day | ORAL | 0 refills | Status: DC

## 2015-12-26 MED ORDER — TICAGRELOR 90 MG PO TABS: 90.0000 mg | ORAL_TABLET | Freq: Two times a day (BID) | ORAL | 0 refills | Status: DC

## 2016-01-05 ENCOUNTER — Telehealth: Payer: Self-pay | Admitting: Cardiovascular Disease

## 2016-01-05 MED ORDER — TICAGRELOR 90 MG PO TABS: 90.0000 mg | ORAL_TABLET | Freq: Two times a day (BID) | ORAL | 0 refills | Status: DC

## 2016-01-05 MED ORDER — AMLODIPINE BESYLATE 10 MG PO TABS: 10.0000 mg | ORAL_TABLET | Freq: Every day | ORAL | 0 refills | Status: DC

## 2016-01-05 MED ORDER — METOPROLOL TARTRATE 50 MG PO TABS: 25.0000 mg | ORAL_TABLET | Freq: Two times a day (BID) | ORAL | 0 refills | Status: DC

## 2016-01-05 MED ORDER — ATORVASTATIN CALCIUM 20 MG PO TABS
20.0000 mg | ORAL_TABLET | Freq: Every day | ORAL | 0 refills | Status: DC
Start: 2016-01-05 — End: 2016-05-29

## 2016-01-05 NOTE — Telephone Encounter (Signed)
New Message   *STAT* If patient is at the pharmacy, call can be transferred to refill team.   1. Which medications need to be refilled? (please list name of each medication and dose if known) Norvasc, Lipitor, Brilinta, Lopressor  2. Which pharmacy/location (including street and city if local pharmacy) is medication to be sent to? [facility], [address], [place], [place] [address]  3. Do they need a 30 day or 90 day supply? 30 days and possibly 90 days.  Pt voiced the previous prescription went to [facility] and she wasn't able to pickup the prescription.  Pt voiced she'll be going out of town on 8.22.17 and informed pt she'll be okay if she was to go to the pharmacy now to pick up the medication for her dad.  Pt voiced while she's out of town her dad would run out of medication.  Please follow up with pt if needed. Thanks!

## 2016-01-05 NOTE — Telephone Encounter (Signed)
Returned call - refills sent. Patient aware these have been sent to correct pharmacy for 90 day dispense as requested. Pt reminded to follow up at next appt.

## 2016-01-09 ENCOUNTER — Ambulatory Visit: Payer: Medicare Other | Admitting: Cardiovascular Disease

## 2016-01-26 ENCOUNTER — Ambulatory Visit: Payer: Medicare Other | Admitting: Cardiology

## 2016-01-26 DIAGNOSIS — I5020 Unspecified systolic (congestive) heart failure: Secondary | ICD-10-CM | POA: Insufficient documentation

## 2016-02-14 ENCOUNTER — Encounter: Payer: Self-pay | Admitting: Cardiology

## 2016-02-14 ENCOUNTER — Ambulatory Visit (INDEPENDENT_AMBULATORY_CARE_PROVIDER_SITE_OTHER): Payer: Medicare Other | Admitting: Cardiology

## 2016-02-14 VITALS — BP 178/82 | HR 74 | Ht 66.0 in | Wt 126.8 lb

## 2016-02-14 DIAGNOSIS — I255 Ischemic cardiomyopathy: Secondary | ICD-10-CM | POA: Diagnosis not present

## 2016-02-14 DIAGNOSIS — I2510 Atherosclerotic heart disease of native coronary artery without angina pectoris: Secondary | ICD-10-CM | POA: Diagnosis not present

## 2016-02-14 DIAGNOSIS — I10 Essential (primary) hypertension: Secondary | ICD-10-CM

## 2016-02-14 DIAGNOSIS — K279 Peptic ulcer, site unspecified, unspecified as acute or chronic, without hemorrhage or perforation: Secondary | ICD-10-CM

## 2016-02-14 DIAGNOSIS — R55 Syncope and collapse: Secondary | ICD-10-CM

## 2016-02-14 DIAGNOSIS — K7030 Alcoholic cirrhosis of liver without ascites: Secondary | ICD-10-CM | POA: Diagnosis not present

## 2016-02-14 DIAGNOSIS — I480 Paroxysmal atrial fibrillation: Secondary | ICD-10-CM | POA: Diagnosis not present

## 2016-02-14 DIAGNOSIS — Z9861 Coronary angioplasty status: Secondary | ICD-10-CM

## 2016-02-14 MED ORDER — RAMIPRIL 10 MG PO CAPS: 10.0000 mg | ORAL_CAPSULE | Freq: Every day | ORAL | 6 refills | Status: DC

## 2016-02-14 NOTE — Assessment & Plan Note (Signed)
Asymptomatic AF with CVR noted on admission for syncope 11/21/15. He later converted to NSR spontaneously. CHADs VASc=3 but felt to be high risk for anticoagulation secondary to ETOH history  NSR today in the office

## 2016-02-14 NOTE — Assessment & Plan Note (Signed)
Endoscopy showed gastritis- no obvious bleeding source

## 2016-02-14 NOTE — Assessment & Plan Note (Signed)
S/P proximal LAD PCI with BMS after he was turned down for CABG (ETOH, cirrhosis)

## 2016-02-14 NOTE — Progress Notes (Signed)
02/14/2016 Derrick Ramos   Apr 12, 1946  [id number]  Primary Physician Delbert Harness, MD Primary Cardiologist: Dr Allyson Sabal  HPI:  70 y.o.AA male with a history of HTN, self reported cirrhosis, daily alcohol use, tobacco abue and PAD who brought by EMS 11/21/15 after syncope episode x 2. EKG on admission showed AF with CVR, he has since converted to NSR. There apparently was a history of hematemasis but his Hgb was stable.  Cath done 11/24/15 showed Ca++ 80% proximal LAD. Initially it was felt LIMA-LAD was best option but after review with the cardiac surgeons and in light of the pt's other co morbidities it was decided to proceed with PCI. This was done by Dr Sanjuana Kava with a BMS placed to the LAD 11/29/15. He was seen in f/u as an OP 12/14/15. He was supposed to see Dr Allyson Sabal today but ended up on my schedule. His son accompanied him The pt was actually confused about the dates of his hospitalization and PCI. Otherwise he was oriented and cooperative. He admits he is still drinking beer, I suspect daily. He denies any chest pain, SOB, or GI bleeding. His son says he is taking his medications as prescribed.    Current Outpatient Prescriptions  Medication Sig Dispense Refill  . amLODipine (NORVASC) 10 MG tablet Take 1 tablet (10 mg total) by mouth daily. 90 tablet 0  . aspirin EC 81 MG EC tablet Take 1 tablet (81 mg total) by mouth daily. 30 tablet 0  . atorvastatin (LIPITOR) 20 MG tablet Take 1 tablet (20 mg total) by mouth daily at 6 PM. 90 tablet 0  . feeding supplement (BOOST / RESOURCE BREEZE) LIQD Take 1 Container by mouth 3 (three) times daily between meals. 90 Container 0  . folic acid (FOLVITE) 1 MG tablet Take 1 tablet (1 mg total) by mouth daily. 30 tablet 0  . metoprolol (LOPRESSOR) 50 MG tablet Take 0.5 tablets (25 mg total) by mouth 2 (two) times daily. 90 tablet 0  . Multiple Vitamin (MULTIVITAMIN WITH MINERALS) TABS tablet Take 1 tablet by mouth daily. 30 tablet 0  . nitroGLYCERIN  (NITROSTAT) 0.4 MG SL tablet Place 1 tablet (0.4 mg total) under the tongue every 5 (five) minutes as needed for chest pain. 30 tablet 0  . pantoprazole (PROTONIX) 40 MG tablet Take 1 tablet (40 mg total) by mouth 2 (two) times daily. 60 tablet 0  . thiamine 100 MG tablet Take 1 tablet (100 mg total) by mouth daily. 30 tablet 0  . ticagrelor (BRILINTA) 90 MG TABS tablet Take 1 tablet (90 mg total) by mouth 2 (two) times daily. 180 tablet 0  . loratadine (CLARITIN) 10 MG tablet Take 1 tablet by mouth daily.    . ramipril (ALTACE) 10 MG capsule Take 1 capsule (10 mg total) by mouth daily. 30 capsule 6   No current facility-administered medications for this visit.     Allergies  Allergen Reactions  . No Known Allergies     Social History   Social History  . Marital status: Married    Spouse name: N/A  . Number of children: N/A  . Years of education: N/A   Occupational History  . Not on file.   Social History Main Topics  . Smoking status: Former Smoker    Packs/day: 0.12    Years: 55.00    Types: Cigarettes    Quit date: 11/21/2015  . Smokeless tobacco: Never Used  . Alcohol use 156.6 oz/week    93  Glasses of wine, 168 Cans of beer per week     Comment: 11/21/2015 daily, 2 - 6 pack tall cans daily beer and gallon wine 2-3 days  . Drug use: No  . Sexual activity: No   Other Topics Concern  . Not on file   Social History Narrative  . No narrative on file     Review of Systems: General: negative for chills, fever, night sweats or weight changes.  Cardiovascular: negative for chest pain, dyspnea on exertion, edema, orthopnea, palpitations, paroxysmal nocturnal dyspnea or shortness of breath Dermatological: negative for rash Respiratory: negative for cough or wheezing Urologic: negative for hematuria Abdominal: negative for nausea, vomiting, diarrhea, bright red blood per rectum, melena, or hematemesis Neurologic: negative for visual changes, syncope, or dizziness All other  systems reviewed and are otherwise negative except as noted above.    Blood pressure (!) 178/82, pulse 74, height 5\' 6"  (1.676 m), weight 126 lb 12.8 oz (57.5 kg).  General appearance: alert, cooperative and no distress Neck: no carotid bruit and no JVD Lungs: clear to auscultation bilaterally Heart: regular rate and rhythm Extremities: extremities normal, atraumatic, no cyanosis or edema Skin: Skin color, texture, turgor normal. No rashes or lesions Neurologic: Grossly normal  EKG NSR, PVCs, poor anterior RW  ASSESSMENT AND PLAN:   CAD -S/P PCI 11/29/15 S/P proximal LAD PCI with BMS after he was turned down for CABG (ETOH, cirrhosis)  Atrial fibrillation (HCC) Asymptomatic AF with CVR noted on admission for syncope 11/21/15. He later converted to NSR spontaneously. CHADs VASc=3 but felt to be high risk for anticoagulation secondary to ETOH history  NSR today in the office  Cardiomyopathy, ischemic EF 40-45% with WMA by echo  PUD (peptic ulcer disease) Endoscopy showed gastritis- no obvious bleeding source  Hepatic cirrhosis (HCC) Pt is still drinking  HTN (hypertension) Sub optimal control  Syncope and collapse Admitted 11/21/15 with syncope and collapse. No clear etiology. Holter 12/04/15- NSR, PVCs EF 40-45% by echo 11/22/15 No further syncope   PLAN  Discussed with Dr Allyson SabalBerry today in the office. Add ACE for HTN. He did have transient renal insufficiency during his hospitalization. Check BMP, CBC in two weeks. For now continue Brilinta and ASA for total of 6 months and then consider changing to ASA and Plavix.   Corine ShelterLuke Lars Jeziorski PA-C 02/14/2016 4:41 PM

## 2016-02-14 NOTE — Assessment & Plan Note (Signed)
Sub optimal control 

## 2016-02-14 NOTE — Assessment & Plan Note (Signed)
Pt is still drinking

## 2016-02-14 NOTE — Assessment & Plan Note (Signed)
EF 40-45% with WMA by echo

## 2016-02-14 NOTE — Patient Instructions (Addendum)
Medication Instructions:  START Altace 10mg  Take 1 tab by mouth daily.  Labwork: Your physician recommends that you return for lab work in: CBC and BMP in 2 weeks   Testing/Procedures: None   Follow-Up: Your physician recommends that you schedule a follow-up appointment in: [name], PA-C or Dr Allyson Sabal. If neither are available patient can call and ask to speak with a nurse and report blood pressure to [facility]   Any Other Special Instructions Will Be Listed Below (If Applicable).   BLOOD PRESSURE CHECK if available  DATE:______________________________________  TIME:______________________________________AM/PM   Please record your blood pressure for 2 weeks if no appointment is available then no appointment is needed, contact office and give the nurse your blood pressure readings.   If you need a refill on your cardiac medications before your next appointment, please call your pharmacy.  NEXT APPOINTMENT  DATE:______________________________________  TIME:______________________________________AM/PM

## 2016-02-14 NOTE — Assessment & Plan Note (Signed)
Admitted 11/21/15 with syncope and collapse. No clear etiology. Holter 12/04/15- NSR, PVCs EF 40-45% by echo 11/22/15 No further syncope

## 2016-04-02 ENCOUNTER — Other Ambulatory Visit: Payer: Self-pay | Admitting: Physician Assistant

## 2016-04-03 NOTE — Telephone Encounter (Signed)
Please review for refill. Thanks!  

## 2016-04-08 ENCOUNTER — Other Ambulatory Visit: Payer: Self-pay

## 2016-04-08 MED ORDER — METOPROLOL TARTRATE 25 MG PO TABS: 12.5000 mg | ORAL_TABLET | Freq: Two times a day (BID) | ORAL | 11 refills | Status: DC

## 2016-04-18 ENCOUNTER — Telehealth: Payer: Self-pay

## 2016-04-18 NOTE — Telephone Encounter (Signed)
Follow Up:; ° ° °Returning your call. °

## 2016-04-18 NOTE — Telephone Encounter (Signed)
Tried to call patients daughter and his wife to confirm dosage of metoprolol he is taking but no answer either number.

## 2016-04-22 ENCOUNTER — Other Ambulatory Visit: Payer: Self-pay | Admitting: *Deleted

## 2016-04-22 MED ORDER — METOPROLOL TARTRATE 25 MG PO TABS: 25.0000 mg | ORAL_TABLET | Freq: Two times a day (BID) | ORAL | 11 refills | Status: DC

## 2016-04-22 NOTE — Telephone Encounter (Signed)
Rx has been sent to the pharmacy electronically. ° °

## 2016-05-27 ENCOUNTER — Telehealth: Payer: Self-pay | Admitting: Cardiology

## 2016-05-27 NOTE — Telephone Encounter (Signed)
When is pt supposed to have his next office visit and with who?. It is not in our recall.Please call me and I will call the daughter.

## 2016-05-28 NOTE — Telephone Encounter (Signed)
Returned call to daughter (ok per DPR).  Advised that OV does not clearly specify when to return. (OV note states patient should call in 2 weeks for appointment for f/u BP).  Also advised patient needed blood work that has not been completed (per OV note should have been completed two weeks after medication change on 9/27).    F/U Appt scheduled for tomorrow 1/10 at 3pm with Sherre Poot, advised to have blood work completed tomorrow as well.    Address and location of lab given.  Daughter verbalized understanding.

## 2016-05-29 ENCOUNTER — Ambulatory Visit (INDEPENDENT_AMBULATORY_CARE_PROVIDER_SITE_OTHER): Payer: Medicare Other | Admitting: Cardiology

## 2016-05-29 ENCOUNTER — Encounter: Payer: Self-pay | Admitting: Cardiology

## 2016-05-29 VITALS — BP 155/78 | HR 74 | Ht 66.0 in | Wt 134.0 lb

## 2016-05-29 DIAGNOSIS — I739 Peripheral vascular disease, unspecified: Secondary | ICD-10-CM | POA: Diagnosis not present

## 2016-05-29 DIAGNOSIS — Z79899 Other long term (current) drug therapy: Secondary | ICD-10-CM

## 2016-05-29 DIAGNOSIS — D649 Anemia, unspecified: Secondary | ICD-10-CM

## 2016-05-29 DIAGNOSIS — I255 Ischemic cardiomyopathy: Secondary | ICD-10-CM | POA: Diagnosis not present

## 2016-05-29 MED ORDER — ATORVASTATIN CALCIUM 20 MG PO TABS: 20.0000 mg | ORAL_TABLET | Freq: Every day | ORAL | 3 refills | Status: DC

## 2016-05-29 MED ORDER — TICAGRELOR 90 MG PO TABS: 90.0000 mg | ORAL_TABLET | Freq: Two times a day (BID) | ORAL | 3 refills | Status: DC

## 2016-05-29 NOTE — Patient Instructions (Signed)
Today have lab done--CBC--AT [address]     IN 6 MONTH PLEASE HAVE LABS DONE -- CBC,CMP,LIPID (BEFORE APPOINTMENT WITH DR BERRY) NOTHING TO EAT OR DRINK THE MORNING OF THE LABS WILL MAIL YOU LETTER AND LAB SLIP AT THAT TIME  NO MEDICATION CHANGES REFILLED BRILINTA, ATORVASTATIN   CALL OFFICE IF YOU HAVE ANYMORE PASSING OUT SPELLS,  WE PLACE A EVENT MONITOR TO SEE IF WE CAN CAPTURE THE PROBLEM   Your physician wants you to follow-up in 6 MONTHS WITH DR Allyson Sabal.You will receive a reminder letter in the mail two months in advance. If you don't receive a letter, please call our office to schedule the follow-up appointment.  If you need a refill on your cardiac medications before your next appointment, please call your pharmacy.

## 2016-05-29 NOTE — Progress Notes (Signed)
05/29/2016 Derrick Ramos   01-Jan-1946  [id number]  Primary Physician Delbert Harness, MD Primary Cardiologist: Dr Allyson Sabal  HPI:  71 y.o.AA male with a history of HTN, self reported cirrhosis, daily alcohol use, former tobacco abue (quit since LOV) and PAD who was brought by EMS to the ED 11/21/15 after a syncope episode x 2. EKG on admission showed AF with CVR, he has since converted to NSR. There apparently was a history of hematemasis but his Hgb was stable. Cath done 11/24/15 showed Ca++ 80% proximal LAD. Initially it was felt LIMA-LAD was best option but after review with the cardiac surgeons and in light of the pt's other co morbidities it was decided to proceed with PCI. This was done by Dr Sanjuana Kava with a BMS placed to the LAD 11/29/15. He was seen in f/u as an OP 12/14/15. He was supposed to see Dr Allyson Sabal in Sept 2017 but ended up on my schedule. His son in law accompanied him then.  He is in the office today with his daughter for follow up. Again there was some confusion and he never received a follow up up or perhaps he didn't remember. He lives in his own home with his wife. He still drinks beer daily.  The pt's daughter relates a history of near syncope, twice in the same day, on the day before Thanksgiving. The pt says he got weak and sweaty. He was brought home and had another episode standing in the kitchen. He declined hospitalization and was doing better the next day. He has not had any further episodes but has felt "weak" at times. He denies chest pain.  Current Outpatient Prescriptions  Medication Sig Dispense Refill  . aspirin EC 81 MG EC tablet Take 1 tablet (81 mg total) by mouth daily. 30 tablet 0  . atorvastatin (LIPITOR) 20 MG tablet Take 1 tablet (20 mg total) by mouth daily at 6 PM. 90 tablet 3  . metoprolol tartrate (LOPRESSOR) 25 MG tablet Take 1 tablet (25 mg total) by mouth 2 (two) times daily. 60 tablet 11  . nitroGLYCERIN (NITROSTAT) 0.4 MG SL tablet Place 1 tablet (0.4 mg  total) under the tongue every 5 (five) minutes as needed for chest pain. 30 tablet 0  . pantoprazole (PROTONIX) 40 MG tablet Take 1 tablet (40 mg total) by mouth 2 (two) times daily. 60 tablet 0  . thiamine 100 MG tablet Take 1 tablet (100 mg total) by mouth daily. 30 tablet 0  . ticagrelor (BRILINTA) 90 MG TABS tablet Take 1 tablet (90 mg total) by mouth 2 (two) times daily. 180 tablet 3  . ramipril (ALTACE) 10 MG capsule Take 1 capsule (10 mg total) by mouth daily. 30 capsule 6   No current facility-administered medications for this visit.     Allergies  Allergen Reactions  . No Known Allergies     Social History   Social History  . Marital status: Married    Spouse name: N/A  . Number of children: N/A  . Years of education: N/A   Occupational History  . Not on file.   Social History Main Topics  . Smoking status: Former Smoker    Packs/day: 0.12    Years: 55.00    Types: Cigarettes    Quit date: 11/21/2015  . Smokeless tobacco: Never Used  . Alcohol use 156.6 oz/week    93 Glasses of wine, 168 Cans of beer per week     Comment: 11/21/2015 daily, 2 - 6  pack tall cans daily beer and gallon wine 2-3 days  . Drug use: No  . Sexual activity: No   Other Topics Concern  . Not on file   Social History Narrative  . No narrative on file     Review of Systems: General: negative for chills, fever, night sweats or weight changes.  Cardiovascular: negative for chest pain, dyspnea on exertion, edema, orthopnea, palpitations, paroxysmal nocturnal dyspnea or shortness of breath Dermatological: negative for rash Respiratory: negative for cough or wheezing Urologic: negative for hematuria Abdominal: negative for nausea, vomiting, diarrhea, bright red blood per rectum, melena, or hematemesis Neurologic: negative for visual changes, syncope, or dizziness All other systems reviewed and are otherwise negative except as noted above.    Blood pressure (!) 155/78, pulse 74, height 5\' 6"   (1.676 m), weight 134 lb (60.8 kg).  General appearance: alert, cooperative, no distress and poor dentition Neck: no carotid bruit and no JVD Lungs: clear to auscultation bilaterally Heart: regular rate and rhythm Extremities: extremities normal, atraumatic, no cyanosis or edema Skin: Skin color, texture, turgor normal. No rashes or lesions Neurologic: Grossly normal  EKG NSR, TWI inferior lateral leads. QTc 488, HR 72  ASSESSMENT AND PLAN:   CAD -S/P PCI 11/29/15 S/P proximal LAD PCI with BMS after he was turned down for CABG (ETOH, cirrhosis)  Atrial fibrillation (HCC) Asymptomatic AF with CVR noted on admission for syncope 11/21/15. He later converted to NSR spontaneously. CHADs VASc=3 but felt to be high risk for anticoagulation secondary to ETOH history  NSR today in the office  Cardiomyopathy, ischemic EF 40-45% with WMA by echo  PUD (peptic ulcer disease) Endoscopy showed gastritis- no obvious bleeding source  Hepatic cirrhosis (HCC) Pt is still drinking  HTN (hypertension) Fair control- 138/70 by me. Hesitant to increase Rx with a history of syncope-possibly orthostatic.   Syncope and collapse Recurrent episode before Thanksgiving-none since Admitted 11/21/15 with syncope and collapse. No clear etiology. Holter 12/04/15- NSR, PVCs EF 40-45% by echo 11/22/15  Claudication:  Pt does have a history of claudication- previously evaluated by a physician in [facility]. The pt declines further work up or intervention. He says he can walk about a mile before his legs hurt.    PLAN  He had a BMP 05/27/16 and this was normal, I'll check a CBC today. B/P by me 138/70 Lt arm sitting. I did not change his medications. I congratulated him on smoking cessation but suggested he now give up ETOH. If he has another syncopal spell I would go ahead and get an echo and an event monitor. F/U with Dr Allyson Sabal in 6 months.   [name] PA-C 05/29/2016 4:10 PM

## 2016-05-29 NOTE — Assessment & Plan Note (Signed)
Pt does have a history of claudication- previously evaluated by a physician in [facility]. The pt declines further work up or intervention. He says he can walk about a mile before his legs hurt.

## 2016-05-30 ENCOUNTER — Ambulatory Visit: Payer: Medicare Other | Admitting: Cardiology

## 2016-07-10 ENCOUNTER — Encounter: Payer: Self-pay | Admitting: Cardiovascular Disease

## 2016-07-10 ENCOUNTER — Ambulatory Visit (INDEPENDENT_AMBULATORY_CARE_PROVIDER_SITE_OTHER): Payer: Medicare Other | Admitting: Cardiovascular Disease

## 2016-07-10 VITALS — BP 162/75 | HR 88 | Ht 66.0 in | Wt 129.8 lb

## 2016-07-10 DIAGNOSIS — F17200 Nicotine dependence, unspecified, uncomplicated: Secondary | ICD-10-CM | POA: Diagnosis not present

## 2016-07-10 DIAGNOSIS — I480 Paroxysmal atrial fibrillation: Secondary | ICD-10-CM

## 2016-07-10 DIAGNOSIS — I10 Essential (primary) hypertension: Secondary | ICD-10-CM | POA: Diagnosis not present

## 2016-07-10 DIAGNOSIS — R0602 Shortness of breath: Secondary | ICD-10-CM

## 2016-07-10 DIAGNOSIS — I2510 Atherosclerotic heart disease of native coronary artery without angina pectoris: Secondary | ICD-10-CM

## 2016-07-10 DIAGNOSIS — Z9861 Coronary angioplasty status: Secondary | ICD-10-CM

## 2016-07-10 DIAGNOSIS — I70219 Atherosclerosis of native arteries of extremities with intermittent claudication, unspecified extremity: Secondary | ICD-10-CM

## 2016-07-10 DIAGNOSIS — I739 Peripheral vascular disease, unspecified: Secondary | ICD-10-CM | POA: Diagnosis not present

## 2016-07-10 MED ORDER — CLOPIDOGREL BISULFATE 75 MG PO TABS: 75.0000 mg | ORAL_TABLET | Freq: Every day | ORAL | 3 refills | Status: DC

## 2016-07-10 NOTE — Progress Notes (Signed)
07/10/2016 Derrick Ramos   [birth year]  [id number]  Primary Physician Derrick Harness, MD Primary Cardiologist: Derrick Gess MD Derrick Ramos  HPI:  Derrick Ramos is a 71 year old thin and frail-appearing married African-American male father of 4, grandfather greater than 10 grandchildren and is accompanied by his daughter Derrick Ramos today. He was last seen in the office by Derrick Ramos 05/29/16. He has a history of treated hypertension and hyperlipidemia. He also has a history of syncope and has had a Holter monitor that has been unrevealing. He was admitted back in July of last year and had a cardiac catheterization performed by Dr. Katrinka Ramos which showed a high-grade calcified proximal LAD lesion which underwent rotational atherectomy and bare metal stenting by Dr. Sanjuana Ramos  on 11/29/15. His other problems include a long history of tobacco abuse having recently quit in July of last year. His ongoing alcohol abuse with documented cirrhosis. He has PAF which spontaneously converted during that hospitalization. Anticoagulation was not pursued because of his history of alcohol use and cirrhosis. Recently has had increasing episodic shortness of breath. He also complains of claudication.   Current Outpatient Prescriptions  Medication Sig Dispense Refill  . aspirin EC 81 MG EC tablet Take 1 tablet (81 mg total) by mouth daily. 30 tablet 0  . atorvastatin (LIPITOR) 20 MG tablet Take 1 tablet (20 mg total) by mouth daily at 6 PM. 90 tablet 3  . loratadine (CLARITIN) 10 MG tablet Take 1 tablet by mouth daily.    . metoprolol tartrate (LOPRESSOR) 25 MG tablet Take 1 tablet (25 mg total) by mouth 2 (two) times daily. 60 tablet 11  . nitroGLYCERIN (NITROSTAT) 0.4 MG SL tablet Place 1 tablet (0.4 mg total) under the tongue every 5 (five) minutes as needed for chest pain. 30 tablet 0  . pantoprazole (PROTONIX) 40 MG tablet Take 1 tablet (40 mg total) by mouth 2 (two) times daily. 60 tablet 0  . thiamine 100  MG tablet Take 1 tablet (100 mg total) by mouth daily. 30 tablet 0  . clopidogrel (PLAVIX) 75 MG tablet Take 1 tablet (75 mg total) by mouth daily. 90 tablet 3  . ramipril (ALTACE) 10 MG capsule Take 1 capsule (10 mg total) by mouth daily. 30 capsule 6   No current facility-administered medications for this visit.     Allergies  Allergen Reactions  . No Known Allergies     Social History   Social History  . Marital status: Married    Spouse name: N/A  . Number of children: N/A  . Years of education: N/A   Occupational History  . Not on file.   Social History Main Topics  . Smoking status: Former Smoker    Packs/day: 0.12    Years: 55.00    Types: Cigarettes    Quit date: 11/21/2015  . Smokeless tobacco: Never Used  . Alcohol use 156.6 oz/week    93 Glasses of wine, 168 Cans of beer per week     Comment: 11/21/2015 daily, 2 - 6 pack tall cans daily beer and gallon wine 2-3 days  . Drug use: No  . Sexual activity: No   Other Topics Concern  . Not on file   Social History Narrative  . No narrative on file     Review of Systems: General: negative for chills, fever, night sweats or weight changes.  Cardiovascular: negative for chest pain, dyspnea on exertion, edema, orthopnea, palpitations, paroxysmal nocturnal dyspnea or shortness of  breath Dermatological: negative for rash Respiratory: negative for cough or wheezing Urologic: negative for hematuria Abdominal: negative for nausea, vomiting, diarrhea, bright red blood per rectum, melena, or hematemesis Neurologic: negative for visual changes, syncope, or dizziness All other systems reviewed and are otherwise negative except as noted above.    Blood pressure (!) 162/75, pulse 88, height 5\' 6"  (1.676 m), weight 129 lb 12.8 oz (58.9 kg), SpO2 98 %.  General appearance: alert and no distress Neck: no adenopathy, no carotid bruit, no JVD, supple, symmetrical, trachea midline and thyroid not enlarged, symmetric, no  tenderness/mass/nodules Lungs: clear to auscultation bilaterally Heart: regular rate and rhythm, S1, S2 normal, no murmur, click, rub or gallop Extremities: extremities normal, atraumatic, no cyanosis or edema  EKG not performed today  ASSESSMENT AND PLAN:   Syncope and collapse Derrick Ramos has had syncope in the past for unclear reasons. He did have a Holter monitor that showed sinus rhythm with PVCs and echo that showed an EF of 45%. He said no further syncopal Derrick Ramos back last month.  Atrial fibrillation (HCC) History of atrial fibrillation with RVR converted spontaneously to sinus rhythm. He was felt not to be a good anticoagulation candidate because of ethanol abuse and cirrhosis.  HTN (hypertension) History of hypertension blood pressure measured 162/75. He is on metoprolol and ramipril. Continue current meds are current dosing  Tobacco use disorder Lifelong smoker, discontinued in July of last year at the time of his hospitalization.  CAD -S/P PCI 11/29/15 History of CAD status post rotational atherectomy, PCI and bare metal stenting by Dr. Fredricka Ramos 11/29/15 to the proximal LAD. He had noncritical disease otherwise and EF of 45% and inferobasal hypokinesia. He does complain of episodic shortness of breath. He is on dual antibiotic therapy including aspirin and Brilenta.  Atherosclerotic PVD with intermittent claudication (HCC) History of claudication which is been progressive. We will get lower extremity arterial Doppler studies.  Shortness of breath Derrick Ramos complains of episodic shortness of breath. He is on Brilenta. I'm going to discontinue Toprol and Start Plavix. If This Does Not Improve His Symptoms We Will Proceed with 2-D Echo and Pharmacologic Myoview Stress Testing.      Derrick Gess MD FACP,FACC,FAHA, [facility] 07/10/2016 5:02 PM

## 2016-07-10 NOTE — Assessment & Plan Note (Addendum)
History of hypertension blood pressure measured 162/75. He is on metoprolol and ramipril. Continue current meds are current dosing

## 2016-07-10 NOTE — Assessment & Plan Note (Signed)
History of atrial fibrillation with RVR converted spontaneously to sinus rhythm. He was felt not to be a good anticoagulation candidate because of ethanol abuse and cirrhosis.

## 2016-07-10 NOTE — Assessment & Plan Note (Signed)
History of claudication which is been progressive. We will get lower extremity arterial Doppler studies.

## 2016-07-10 NOTE — Assessment & Plan Note (Signed)
Derrick Ramos has had syncope in the past for unclear reasons. He did have a Holter monitor that showed sinus rhythm with PVCs and echo that showed an EF of 45%. He said no further syncopal [name] back last month.

## 2016-07-10 NOTE — Assessment & Plan Note (Signed)
Lifelong smoker, discontinued in July of last year at the time of his hospitalization.

## 2016-07-10 NOTE — Assessment & Plan Note (Signed)
Derrick Ramos complains of episodic shortness of breath. He is on Brilenta. I'm going to discontinue Toprol and Start Plavix. If This Does Not Improve His Symptoms We Will Proceed with 2-D Echo and Pharmacologic Myoview Stress Testing.

## 2016-07-10 NOTE — Assessment & Plan Note (Signed)
History of CAD status post rotational atherectomy, PCI and bare metal stenting by Dr. Fredricka Bonine 11/29/15 to the proximal LAD. He had noncritical disease otherwise and EF of 45% and inferobasal hypokinesia. He does complain of episodic shortness of breath. He is on dual antibiotic therapy including aspirin and Brilenta.

## 2016-07-10 NOTE — Patient Instructions (Signed)
Medication Instructions: STOP Brilinta  START Plavix---first dose 4 tablets (300 mg); then take 1 tablet (75 mg) daily.  Testing/Procedures: Your physician has requested that you have a lower extremity arterial duplex. During this test, ultrasound is used to evaluate arterial blood flow in the legs. Allow one hour for this exam. There are no restrictions or special instructions.  Your physician has requested that you have an ankle brachial index (ABI). During this test an ultrasound and blood pressure cuff are used to evaluate the arteries that supply the arms and legs with blood. Allow thirty minutes for this exam. There are no restrictions or special instructions.  Follow-Up: Your physician recommends that you schedule a follow-up appointment in: 1 month with Dr. Allyson Sabal.  If you need a refill on your cardiac medications before your next appointment, please call your pharmacy.

## 2016-08-07 ENCOUNTER — Telehealth: Payer: Self-pay | Admitting: Cardiovascular Disease

## 2016-08-07 ENCOUNTER — Ambulatory Visit (HOSPITAL_COMMUNITY)
Admission: RE | Admit: 2016-08-07 | Discharge: 2016-08-07 | Disposition: A | Discharge status: Home / Self Care | Payer: Medicare Other | Source: Ambulatory Visit | Attending: Cardiovascular Disease | Admitting: Cardiovascular Disease

## 2016-08-07 DIAGNOSIS — I743 Embolism and thrombosis of arteries of the lower extremities: Secondary | ICD-10-CM | POA: Diagnosis not present

## 2016-08-07 DIAGNOSIS — I700 Atherosclerosis of aorta: Secondary | ICD-10-CM | POA: Diagnosis not present

## 2016-08-07 DIAGNOSIS — R9439 Abnormal result of other cardiovascular function study: Secondary | ICD-10-CM | POA: Diagnosis not present

## 2016-08-07 DIAGNOSIS — I739 Peripheral vascular disease, unspecified: Secondary | ICD-10-CM | POA: Insufficient documentation

## 2016-08-07 NOTE — Telephone Encounter (Signed)
Pt came in today with his daughter for LEA Doppler and ABI. Pt then stayed as a walk-in and spoke with Fabiola Backer, LPN who brought this to my attention.  I got the preliminary report from Dondra Prader, who performed the doppler, for Dr. Allyson Sabal to review at pt next appointment scheduled for 08/09/16. Then spoke with pt and his daughter.  Pt daughter stated that 2 weeks ago they went to PCP for office visit. They started pt on Claritin 10 mg daily and Allopurinol 100 mg daily. On Tuesday, 07/30/16, pt daughter noticed her father's feet were severely swollen--was unable to put socks on--and pt complaining of pain in right big toe. Pt daughter contacted PCP office who advised her to contact Cardiologist concerning this. She advised her father to keep legs elevated. The next day, swelling had not come down, pt was still complaining of pain in right big toe. Pt daughter stated she stopped giving him the Allopurinol last Friday, 08/02/16. Swelling has come down since then and pt is able to put on shoes. Pt stated there is still some mild swelling, new itching on left foot (wound from scratching), and still pain in toe.   I told pt I will send a message to Dr. Allyson Sabal to make him aware and call them with/if any recommendations. Also reminded them to keep appt scheduled on 08/09/16 to discuss these issues and results of doppler. Pt and his daughter verbalized understanding and gratitude.

## 2016-08-09 ENCOUNTER — Ambulatory Visit (INDEPENDENT_AMBULATORY_CARE_PROVIDER_SITE_OTHER): Payer: Medicare Other | Admitting: Cardiovascular Disease

## 2016-08-09 ENCOUNTER — Encounter: Payer: Self-pay | Admitting: Cardiovascular Disease

## 2016-08-09 VITALS — BP 147/89 | HR 88 | Ht 66.0 in | Wt 144.0 lb

## 2016-08-09 DIAGNOSIS — R0602 Shortness of breath: Secondary | ICD-10-CM | POA: Diagnosis not present

## 2016-08-09 NOTE — Patient Instructions (Addendum)
Medication Instructions:  Your physician recommends that you continue on your current medications as directed. Please refer to the Current Medication list given to you today.  Labwork: NONE   Testing/Procedures: Your physician has requested that you have an echocardiogram. Echocardiography is a painless test that uses sound waves to create images of your heart. It provides your doctor with information about the size and shape of your heart and how well your heart's chambers and valves are working. This procedure takes approximately one hour. There are no restrictions for this procedure.  Your physician has requested that you have a lexiscan myoview. For further information please visit [email or web address] Please follow instruction sheet, as given.  A chest x-ray takes a picture of the organs and structures inside the chest, including the heart, lungs, and blood vessels. This test can show several things, including, whether the heart is enlarges; whether fluid is building up in the lungs; and whether pacemaker / defibrillator leads are still in place.  Follow-Up: Your physician recommends that you schedule a follow-up appointment in: AFTER ALL TESTING FOLLOW UP WITH DR BERRY.  Any Other Special Instructions Will Be Listed Below (If Applicable).  If you need a refill on your cardiac medications before your next appointment, please call your pharmacy.

## 2016-08-09 NOTE — Assessment & Plan Note (Signed)
Mr. Decaprio complains of progressive dyspnea over the last several months. He has smoked over the last 50 years and stopped in July of last year. Shortness of breath against several months ago. I did stop the Brilenta and begin clopidogrel hoping that this was the cause for her shortness of breath persists. I'm going to obtain a 2-D echo and a formal neurologic Myoview stress test to rule out ischemic etiology.

## 2016-08-09 NOTE — Progress Notes (Signed)
08/09/2016 Derrick Ramos   20-Jun-1945  [id number]  Primary Physician Derrick Harness, MD Primary Cardiologist: Derrick Gess MD Derrick Ramos  HPI:   Derrick Ramos is a 71 year old thin and frail-appearing married African-American male father of 4, grandfather greater than 10 grandchildren and is accompanied by his daughter Derrick Ramos today. I last saw him in the office 07/10/16. He has a history of treated hypertension and hyperlipidemia. He also has a history of syncope and has had a Holter monitor that has been unrevealing. He was admitted back in July of last year and had a cardiac catheterization performed by Derrick Ramos which showed a high-grade calcified proximal LAD lesion which underwent rotational atherectomy and bare metal stenting by Derrick Ramos  on 11/29/15. His other problems include a long history of tobacco abuse having recently quit in July of last year. His ongoing alcohol abuse with documented cirrhosis. He has PAF which spontaneously converted during that hospitalization. Anticoagulation was not pursued because of his history of alcohol use and cirrhosis. Recently has had increasing episodic shortness of breath. He also complains of claudication. I obtain Doppler studies on him 08/07/16 revealing a right ABI 0.64 and left ABI 0.9. He did have SFA disease bilaterally, and occluded right popliteal artery as well. In addition, I did stop his Brilenta and transition to Plavix hoping this was the cause of his shortness of breath however it did not affect the quality of his breathing at all. I'm worried that his shortness of breath may be related to myocardial ischemia.   Current Outpatient Prescriptions  Medication Sig Dispense Refill  . aspirin EC 81 MG EC tablet Take 1 tablet (81 mg total) by mouth daily. 30 tablet 0  . atorvastatin (LIPITOR) 20 MG tablet Take 1 tablet (20 mg total) by mouth daily at 6 PM. 90 tablet 3  . clopidogrel (PLAVIX) 75 MG tablet Take 1 tablet (75 mg total)  by mouth daily. 90 tablet 3  . loratadine (CLARITIN) 10 MG tablet Take 1 tablet by mouth daily.    . metoprolol tartrate (LOPRESSOR) 25 MG tablet Take 1 tablet (25 mg total) by mouth 2 (two) times daily. 60 tablet 11  . nitroGLYCERIN (NITROSTAT) 0.4 MG SL tablet Place 1 tablet (0.4 mg total) under the tongue every 5 (five) minutes as needed for chest pain. 30 tablet 0  . pantoprazole (PROTONIX) 40 MG tablet Take 1 tablet (40 mg total) by mouth 2 (two) times daily. 60 tablet 0  . thiamine 100 MG tablet Take 1 tablet (100 mg total) by mouth daily. 30 tablet 0  . ramipril (ALTACE) 10 MG capsule Take 1 capsule (10 mg total) by mouth daily. 30 capsule 6   No current facility-administered medications for this visit.     Allergies  Allergen Reactions  . No Known Allergies     Social History   Social History  . Marital status: Married    Spouse name: N/A  . Number of children: N/A  . Years of education: N/A   Occupational History  . Not on file.   Social History Main Topics  . Smoking status: Former Smoker    Packs/day: 0.12    Years: 55.00    Types: Cigarettes    Quit date: 11/21/2015  . Smokeless tobacco: Never Used  . Alcohol use 156.6 oz/week    93 Glasses of wine, 168 Cans of beer per week     Comment: 11/21/2015 daily, 2 - 6 pack tall cans  daily beer and gallon wine 2-3 days  . Drug use: No  . Sexual activity: No   Other Topics Concern  . Not on file   Social History Narrative  . No narrative on file     Review of Systems: General: negative for chills, fever, night sweats or weight changes.  Cardiovascular: negative for chest pain, dyspnea on exertion, edema, orthopnea, palpitations, paroxysmal nocturnal dyspnea or shortness of breath Dermatological: negative for rash Respiratory: negative for cough or wheezing Urologic: negative for hematuria Abdominal: negative for nausea, vomiting, diarrhea, bright red blood per rectum, melena, or hematemesis Neurologic: negative  for visual changes, syncope, or dizziness All other systems reviewed and are otherwise negative except as noted above.    Blood pressure (!) 147/89, pulse 88, height 5\' 6"  (1.676 m), weight 144 lb (65.3 kg), SpO2 97 %.  General appearance: alert and no distress Neck: no adenopathy, no carotid bruit, no JVD, supple, symmetrical, trachea midline and thyroid not enlarged, symmetric, no tenderness/mass/nodules Lungs: clear to auscultation bilaterally Heart: regular rate and rhythm, S1, S2 normal, no murmur, click, rub or gallop Extremities: extremities normal, atraumatic, no cyanosis or edema  EKG not performed today  ASSESSMENT AND PLAN:   Claudication Derrick Ramos) Derrick Ramos returns safer follow-up of his Doppler studies performed 08/07/16 for the evaluation of claudication. His right ABI was 0.64 with a high-grade lesion in his mid right SFA, occluded right popliteal artery with occluded posterior tibial. Left ABI was 0.9 the high-frequency signal in the origin of his left SFA with one-vessel runoff via a posterior tibial. He does complain otherwise following claudication. We talked about diagnostic and therapeutic options and he agrees to proceed with angiography and potential intervention.  Shortness of breath Derrick Ramos complains of progressive dyspnea over the last several months. He has smoked over the last 50 years and stopped in July of last year. Shortness of breath against several months ago. I did stop the Brilenta and begin clopidogrel hoping that this was the cause for her shortness of breath persists. I'm going to obtain a 2-D echo and a formal neurologic Myoview stress test to rule out ischemic etiology.      Derrick Gess MD FACP,FACC,FAHA, Derrick Ramos 08/09/2016 4:35 PM

## 2016-08-09 NOTE — Assessment & Plan Note (Signed)
Derrick Ramos returns safer follow-up of his Doppler studies performed 08/07/16 for the evaluation of claudication. His right ABI was 0.64 with a high-grade lesion in his mid right SFA, occluded right popliteal artery with occluded posterior tibial. Left ABI was 0.9 the high-frequency signal in the origin of his left SFA with one-vessel runoff via a posterior tibial. He does complain otherwise following claudication. We talked about diagnostic and therapeutic options and he agrees to proceed with angiography and potential intervention.

## 2016-08-15 ENCOUNTER — Other Ambulatory Visit: Payer: Self-pay | Admitting: Cardiovascular Disease

## 2016-08-18 DIAGNOSIS — I509 Heart failure, unspecified: Secondary | ICD-10-CM

## 2016-08-18 DIAGNOSIS — D649 Anemia, unspecified: Secondary | ICD-10-CM

## 2016-08-18 HISTORY — DX: Anemia, unspecified: D64.9

## 2016-08-18 HISTORY — DX: Heart failure, unspecified: I50.9

## 2016-08-22 ENCOUNTER — Telehealth (HOSPITAL_COMMUNITY): Payer: Self-pay

## 2016-08-22 NOTE — Telephone Encounter (Signed)
Encounter complete. 

## 2016-08-27 ENCOUNTER — Other Ambulatory Visit: Payer: Self-pay

## 2016-08-27 ENCOUNTER — Ambulatory Visit (HOSPITAL_BASED_OUTPATIENT_CLINIC_OR_DEPARTMENT_OTHER)
Admission: RE | Admit: 2016-08-27 | Discharge: 2016-08-27 | Disposition: A | Discharge status: Home / Self Care | Payer: Medicare Other | Source: Ambulatory Visit | Attending: Cardiovascular Disease | Admitting: Cardiovascular Disease

## 2016-08-27 ENCOUNTER — Emergency Department (HOSPITAL_COMMUNITY): Payer: Medicare Other

## 2016-08-27 ENCOUNTER — Inpatient Hospital Stay (HOSPITAL_COMMUNITY)
Admission: EM | Admit: 2016-08-27 | Discharge: 2016-08-31 | DRG: 287 | Disposition: A | Discharge status: Home / Self Care | Payer: Medicare Other | Attending: Student in an Organized Health Care Education/Training Program | Admitting: Student in an Organized Health Care Education/Training Program

## 2016-08-27 ENCOUNTER — Encounter (HOSPITAL_COMMUNITY): Payer: Self-pay

## 2016-08-27 ENCOUNTER — Ambulatory Visit (HOSPITAL_BASED_OUTPATIENT_CLINIC_OR_DEPARTMENT_OTHER): Payer: Medicare Other

## 2016-08-27 DIAGNOSIS — Z955 Presence of coronary angioplasty implant and graft: Secondary | ICD-10-CM | POA: Diagnosis not present

## 2016-08-27 DIAGNOSIS — I110 Hypertensive heart disease with heart failure: Secondary | ICD-10-CM | POA: Diagnosis present

## 2016-08-27 DIAGNOSIS — R160 Hepatomegaly, not elsewhere classified: Secondary | ICD-10-CM | POA: Diagnosis not present

## 2016-08-27 DIAGNOSIS — K7460 Unspecified cirrhosis of liver: Secondary | ICD-10-CM | POA: Diagnosis present

## 2016-08-27 DIAGNOSIS — D649 Anemia, unspecified: Secondary | ICD-10-CM | POA: Diagnosis not present

## 2016-08-27 DIAGNOSIS — I361 Nonrheumatic tricuspid (valve) insufficiency: Secondary | ICD-10-CM | POA: Diagnosis present

## 2016-08-27 DIAGNOSIS — E872 Acidosis: Secondary | ICD-10-CM | POA: Diagnosis present

## 2016-08-27 DIAGNOSIS — R0602 Shortness of breath: Secondary | ICD-10-CM | POA: Diagnosis not present

## 2016-08-27 DIAGNOSIS — I272 Pulmonary hypertension, unspecified: Secondary | ICD-10-CM | POA: Diagnosis present

## 2016-08-27 DIAGNOSIS — F1010 Alcohol abuse, uncomplicated: Secondary | ICD-10-CM | POA: Diagnosis present

## 2016-08-27 DIAGNOSIS — I50810 Right heart failure, unspecified: Secondary | ICD-10-CM | POA: Diagnosis present

## 2016-08-27 DIAGNOSIS — Z8719 Personal history of other diseases of the digestive system: Secondary | ICD-10-CM | POA: Diagnosis not present

## 2016-08-27 DIAGNOSIS — J90 Pleural effusion, not elsewhere classified: Secondary | ICD-10-CM

## 2016-08-27 DIAGNOSIS — Z87891 Personal history of nicotine dependence: Secondary | ICD-10-CM

## 2016-08-27 DIAGNOSIS — I472 Ventricular tachycardia: Secondary | ICD-10-CM | POA: Diagnosis not present

## 2016-08-27 DIAGNOSIS — Z841 Family history of disorders of kidney and ureter: Secondary | ICD-10-CM

## 2016-08-27 DIAGNOSIS — Z7902 Long term (current) use of antithrombotics/antiplatelets: Secondary | ICD-10-CM | POA: Diagnosis not present

## 2016-08-27 DIAGNOSIS — E876 Hypokalemia: Secondary | ICD-10-CM | POA: Diagnosis present

## 2016-08-27 DIAGNOSIS — I493 Ventricular premature depolarization: Secondary | ICD-10-CM | POA: Diagnosis not present

## 2016-08-27 DIAGNOSIS — I501 Left ventricular failure: Secondary | ICD-10-CM

## 2016-08-27 DIAGNOSIS — I255 Ischemic cardiomyopathy: Secondary | ICD-10-CM | POA: Diagnosis present

## 2016-08-27 DIAGNOSIS — K219 Gastro-esophageal reflux disease without esophagitis: Secondary | ICD-10-CM | POA: Diagnosis present

## 2016-08-27 DIAGNOSIS — Z7982 Long term (current) use of aspirin: Secondary | ICD-10-CM

## 2016-08-27 DIAGNOSIS — R9431 Abnormal electrocardiogram [ECG] [EKG]: Secondary | ICD-10-CM | POA: Insufficient documentation

## 2016-08-27 DIAGNOSIS — F1020 Alcohol dependence, uncomplicated: Secondary | ICD-10-CM | POA: Diagnosis not present

## 2016-08-27 DIAGNOSIS — N179 Acute kidney failure, unspecified: Secondary | ICD-10-CM | POA: Diagnosis present

## 2016-08-27 DIAGNOSIS — D509 Iron deficiency anemia, unspecified: Secondary | ICD-10-CM | POA: Diagnosis present

## 2016-08-27 DIAGNOSIS — I5020 Unspecified systolic (congestive) heart failure: Secondary | ICD-10-CM | POA: Diagnosis not present

## 2016-08-27 DIAGNOSIS — D638 Anemia in other chronic diseases classified elsewhere: Secondary | ICD-10-CM | POA: Diagnosis present

## 2016-08-27 DIAGNOSIS — I071 Rheumatic tricuspid insufficiency: Secondary | ICD-10-CM | POA: Diagnosis not present

## 2016-08-27 DIAGNOSIS — I2510 Atherosclerotic heart disease of native coronary artery without angina pectoris: Secondary | ICD-10-CM

## 2016-08-27 DIAGNOSIS — R339 Retention of urine, unspecified: Secondary | ICD-10-CM | POA: Diagnosis present

## 2016-08-27 DIAGNOSIS — K7030 Alcoholic cirrhosis of liver without ascites: Secondary | ICD-10-CM | POA: Diagnosis present

## 2016-08-27 DIAGNOSIS — Z79899 Other long term (current) drug therapy: Secondary | ICD-10-CM

## 2016-08-27 DIAGNOSIS — Z8711 Personal history of peptic ulcer disease: Secondary | ICD-10-CM

## 2016-08-27 DIAGNOSIS — E785 Hyperlipidemia, unspecified: Secondary | ICD-10-CM | POA: Diagnosis present

## 2016-08-27 DIAGNOSIS — I5023 Acute on chronic systolic (congestive) heart failure: Secondary | ICD-10-CM | POA: Diagnosis not present

## 2016-08-27 DIAGNOSIS — I5043 Acute on chronic combined systolic (congestive) and diastolic (congestive) heart failure: Secondary | ICD-10-CM | POA: Diagnosis present

## 2016-08-27 DIAGNOSIS — I480 Paroxysmal atrial fibrillation: Secondary | ICD-10-CM | POA: Diagnosis present

## 2016-08-27 DIAGNOSIS — I5033 Acute on chronic diastolic (congestive) heart failure: Secondary | ICD-10-CM

## 2016-08-27 DIAGNOSIS — I2583 Coronary atherosclerosis due to lipid rich plaque: Secondary | ICD-10-CM | POA: Diagnosis not present

## 2016-08-27 DIAGNOSIS — I739 Peripheral vascular disease, unspecified: Secondary | ICD-10-CM | POA: Diagnosis present

## 2016-08-27 HISTORY — DX: Heart failure, unspecified: I50.9

## 2016-08-27 HISTORY — DX: Anemia, unspecified: D64.9

## 2016-08-27 HISTORY — DX: Dyspnea, unspecified: R06.00

## 2016-08-27 LAB — CBC WITH DIFFERENTIAL/PLATELET
BASOS ABS: 0 10*3/uL (ref 0.0–0.1)
BASOS PCT: 0 %
EOS ABS: 0 10*3/uL (ref 0.0–0.7)
Eosinophils Relative: 0 %
HEMATOCRIT: 23.1 % — AB (ref 39.0–52.0)
HEMOGLOBIN: 6.6 g/dL — AB (ref 13.0–17.0)
LYMPHS PCT: 15 %
Lymphs Abs: 1 10*3/uL (ref 0.7–4.0)
MCH: 17.6 pg — AB (ref 26.0–34.0)
MCHC: 28.6 g/dL — ABNORMAL LOW (ref 30.0–36.0)
MCV: 61.4 fL — ABNORMAL LOW (ref 78.0–100.0)
MONOS PCT: 15 %
Monocytes Absolute: 1 10*3/uL (ref 0.1–1.0)
NEUTROS ABS: 4.9 10*3/uL (ref 1.7–7.7)
NEUTROS PCT: 70 %
Platelets: 383 10*3/uL (ref 150–400)
RBC: 3.76 MIL/uL — ABNORMAL LOW (ref 4.22–5.81)
RDW: 21.1 % — AB (ref 11.5–15.5)
WBC: 6.9 10*3/uL (ref 4.0–10.5)

## 2016-08-27 LAB — COMPREHENSIVE METABOLIC PANEL
ALBUMIN: 3.4 g/dL — AB (ref 3.5–5.0)
ALK PHOS: 124 U/L (ref 38–126)
ALT: 46 U/L (ref 17–63)
AST: 52 U/L — AB (ref 15–41)
Anion gap: 17 — ABNORMAL HIGH (ref 5–15)
BILIRUBIN TOTAL: 1.2 mg/dL (ref 0.3–1.2)
BUN: 21 mg/dL — AB (ref 6–20)
CALCIUM: 6.6 mg/dL — AB (ref 8.9–10.3)
CO2: 16 mmol/L — ABNORMAL LOW (ref 22–32)
CREATININE: 1.84 mg/dL — AB (ref 0.61–1.24)
Chloride: 106 mmol/L (ref 101–111)
GFR calc Af Amer: 41 mL/min — ABNORMAL LOW (ref >60)
GFR, EST NON AFRICAN AMERICAN: 35 mL/min — AB (ref >60)
GLUCOSE: 93 mg/dL (ref 65–99)
Potassium: 3.7 mmol/L (ref 3.5–5.1)
Sodium: 139 mmol/L (ref 135–145)
TOTAL PROTEIN: 7.3 g/dL (ref 6.5–8.1)

## 2016-08-27 LAB — ECHOCARDIOGRAM COMPLETE
Height: 66 in
WEIGHTICAEL: 2304 [oz_av]

## 2016-08-27 LAB — URINALYSIS, ROUTINE W REFLEX MICROSCOPIC
Bilirubin Urine: NEGATIVE
GLUCOSE, UA: NEGATIVE mg/dL
Hgb urine dipstick: NEGATIVE
KETONES UR: NEGATIVE mg/dL
LEUKOCYTES UA: NEGATIVE
NITRITE: NEGATIVE
PROTEIN: NEGATIVE mg/dL
Specific Gravity, Urine: 1.006 (ref 1.005–1.030)
pH: 5 (ref 5.0–8.0)

## 2016-08-27 LAB — MYOCARDIAL PERFUSION IMAGING
CHL CUP NUCLEAR SDS: 0
CHL CUP NUCLEAR SRS: 0
CSEPPHR: 92 {beats}/min
LV dias vol: 156 mL (ref 62–150)
LV sys vol: 124 mL
Rest HR: 85 {beats}/min
SSS: 0
TID: 1.06

## 2016-08-27 LAB — RAPID URINE DRUG SCREEN, HOSP PERFORMED
AMPHETAMINES: NOT DETECTED
Barbiturates: NOT DETECTED
Benzodiazepines: NOT DETECTED
Cocaine: NOT DETECTED
OPIATES: NOT DETECTED
TETRAHYDROCANNABINOL: NOT DETECTED

## 2016-08-27 LAB — BRAIN NATRIURETIC PEPTIDE: B NATRIURETIC PEPTIDE 5: 2433 pg/mL — AB (ref 0.0–100.0)

## 2016-08-27 LAB — ABO/RH: ABO/RH(D): O POS

## 2016-08-27 LAB — POC OCCULT BLOOD, ED: FECAL OCCULT BLD: NEGATIVE

## 2016-08-27 LAB — I-STAT TROPONIN, ED: Troponin i, poc: 0 ng/mL (ref 0.00–0.08)

## 2016-08-27 LAB — PREPARE RBC (CROSSMATCH)

## 2016-08-27 MED ORDER — ADULT MULTIVITAMIN W/MINERALS CH
1.0000 | ORAL_TABLET | Freq: Every day | ORAL | Status: DC
  Administered 2016-08-28 – 2016-08-31 (×3): 1 via ORAL
  Filled 2016-08-27 (×3): qty 1

## 2016-08-27 MED ORDER — PANTOPRAZOLE SODIUM 40 MG PO TBEC
40.0000 mg | DELAYED_RELEASE_TABLET | Freq: Two times a day (BID) | ORAL | Status: DC
  Administered 2016-08-27 – 2016-08-31 (×7): 40 mg via ORAL
  Filled 2016-08-27 (×7): qty 1

## 2016-08-27 MED ORDER — THIAMINE HCL 100 MG/ML IJ SOLN
100.0000 mg | Freq: Every day | INTRAMUSCULAR | Status: DC
  Filled 2016-08-27: qty 2

## 2016-08-27 MED ORDER — VITAMIN B-1 100 MG PO TABS
100.0000 mg | ORAL_TABLET | Freq: Every day | ORAL | Status: DC
  Administered 2016-08-28 – 2016-08-31 (×3): 100 mg via ORAL
  Filled 2016-08-27 (×3): qty 1

## 2016-08-27 MED ORDER — LORATADINE 10 MG PO TABS
10.0000 mg | ORAL_TABLET | Freq: Every day | ORAL | Status: DC
  Administered 2016-08-28 – 2016-08-31 (×3): 10 mg via ORAL
  Filled 2016-08-27 (×3): qty 1

## 2016-08-27 MED ORDER — VITAMIN B-1 100 MG PO TABS: 100.0000 mg | ORAL_TABLET | Freq: Every day | ORAL | Status: DC

## 2016-08-27 MED ORDER — ACETAMINOPHEN 325 MG PO TABS: 650.0000 mg | ORAL_TABLET | ORAL | Status: DC | PRN

## 2016-08-27 MED ORDER — SODIUM CHLORIDE 0.9% FLUSH
3.0000 mL | Freq: Two times a day (BID) | INTRAVENOUS | Status: DC
  Administered 2016-08-28 (×2): 3 mL via INTRAVENOUS
  Administered 2016-08-29: 6 mL via INTRAVENOUS
  Administered 2016-08-30 – 2016-08-31 (×2): 3 mL via INTRAVENOUS

## 2016-08-27 MED ORDER — ATORVASTATIN CALCIUM 20 MG PO TABS
20.0000 mg | ORAL_TABLET | Freq: Every day | ORAL | Status: DC
  Administered 2016-08-28 – 2016-08-30 (×3): 20 mg via ORAL
  Filled 2016-08-27 (×3): qty 1

## 2016-08-27 MED ORDER — TECHNETIUM TC 99M TETROFOSMIN IV KIT
30.5000 | PACK | Freq: Once | INTRAVENOUS | Status: AC | PRN
  Administered 2016-08-27: 30.5 via INTRAVENOUS
  Filled 2016-08-27: qty 31

## 2016-08-27 MED ORDER — METOPROLOL TARTRATE 25 MG PO TABS
25.0000 mg | ORAL_TABLET | Freq: Two times a day (BID) | ORAL | Status: DC
  Administered 2016-08-27: 25 mg via ORAL
  Filled 2016-08-27: qty 1

## 2016-08-27 MED ORDER — SODIUM CHLORIDE 0.9% FLUSH: 3.0000 mL | INTRAVENOUS | Status: DC | PRN

## 2016-08-27 MED ORDER — FOLIC ACID 1 MG PO TABS
1.0000 mg | ORAL_TABLET | Freq: Every day | ORAL | Status: DC
  Administered 2016-08-28 – 2016-08-31 (×3): 1 mg via ORAL
  Filled 2016-08-27 (×3): qty 1

## 2016-08-27 MED ORDER — POTASSIUM CHLORIDE CRYS ER 20 MEQ PO TBCR
20.0000 meq | EXTENDED_RELEASE_TABLET | Freq: Every day | ORAL | Status: DC
  Administered 2016-08-27 – 2016-08-28 (×2): 20 meq via ORAL
  Filled 2016-08-27 (×2): qty 1

## 2016-08-27 MED ORDER — TECHNETIUM TC 99M TETROFOSMIN IV KIT
10.1000 | PACK | Freq: Once | INTRAVENOUS | Status: AC | PRN
  Administered 2016-08-27: 10.1 via INTRAVENOUS
  Filled 2016-08-27: qty 11

## 2016-08-27 MED ORDER — FUROSEMIDE 10 MG/ML IJ SOLN
40.0000 mg | Freq: Once | INTRAMUSCULAR | Status: AC
  Administered 2016-08-27: 40 mg via INTRAVENOUS
  Filled 2016-08-27: qty 4

## 2016-08-27 MED ORDER — ONDANSETRON HCL 4 MG/2ML IJ SOLN: 4.0000 mg | Freq: Four times a day (QID) | INTRAMUSCULAR | Status: DC | PRN

## 2016-08-27 MED ORDER — FUROSEMIDE 10 MG/ML IJ SOLN
40.0000 mg | Freq: Two times a day (BID) | INTRAMUSCULAR | Status: DC
  Administered 2016-08-28 – 2016-08-31 (×7): 40 mg via INTRAVENOUS
  Filled 2016-08-27 (×7): qty 4

## 2016-08-27 MED ORDER — REGADENOSON 0.4 MG/5ML IV SOLN
0.4000 mg | Freq: Once | INTRAVENOUS | Status: AC
  Administered 2016-08-27: 0.4 mg via INTRAVENOUS

## 2016-08-27 MED ORDER — SODIUM CHLORIDE 0.9 % IV SOLN: 250.0000 mL | INTRAVENOUS | Status: DC | PRN

## 2016-08-27 MED ORDER — NITROGLYCERIN 0.4 MG SL SUBL: 0.4000 mg | SUBLINGUAL_TABLET | SUBLINGUAL | Status: DC | PRN

## 2016-08-27 MED ORDER — RAMIPRIL 10 MG PO CAPS
10.0000 mg | ORAL_CAPSULE | Freq: Every day | ORAL | Status: DC
  Administered 2016-08-28 – 2016-08-31 (×4): 10 mg via ORAL
  Filled 2016-08-27 (×4): qty 1

## 2016-08-27 MED ORDER — SODIUM CHLORIDE 0.9 % IV SOLN
10.0000 mL/h | Freq: Once | INTRAVENOUS | Status: AC
  Administered 2016-08-27: 10 mL/h via INTRAVENOUS

## 2016-08-27 MED ORDER — AMINOPHYLLINE 25 MG/ML IV SOLN
75.0000 mg | Freq: Once | INTRAVENOUS | Status: AC
  Administered 2016-08-27: 75 mg via INTRAVENOUS

## 2016-08-27 NOTE — ED Notes (Signed)
Admitting at bedside 

## 2016-08-27 NOTE — ED Notes (Signed)
Pt reports need to void but unable, MD made aware.

## 2016-08-27 NOTE — Progress Notes (Addendum)
Dr. Ranae Palms called to state that patient's Hbg came back at 6.6 and likely the etiology of his CHF and his SOB.  Will have medicine service admit and we will follow in consult.  He has a history of PCI but with bare metal stent so ok to hold ASA and Plavix for now.  Will stop SQ lovenox and change to SCDs

## 2016-08-27 NOTE — ED Notes (Signed)
ED Provider at bedside. 

## 2016-08-27 NOTE — ED Notes (Signed)
Patient transported to X-ray 

## 2016-08-27 NOTE — H&P (Signed)
Date: 08/27/2016               Patient Name:  Derrick Ramos MRN: [id number]  DOB: October 29, 1945 Age / Sex: 71 y.o., male   PCP: Macy Mis, MD         Medical Service: Internal Medicine Teaching Service         Attending Physician: Dr. Tyson Alias, MD    First Contact: Dr. Alm Bustard Pager: [phone]  Second Contact: Dr. Deneise Lever Pager: [phone]       After Hours (After 5p/  First Contact Pager: [phone]  weekends / holidays): Second Contact Pager: [phone]   Chief Complaint: Shortness of Breath  History of Present Illness: The patient is a 71 year old male with a past medical history of atrial fibrillation, left ventricular dysfunction with most recent EF of 20-25%, CAD with PCI to LAD in 11/2015 on DAPT (aspirin and Plavix) hypertension, self-reported history of cirrhosis, daily alcohol abuse, tobacco abuse, gastroesophageal reflux disease and severe peripheral arterial disease who presents as a referral from his cardiology office where he was noted to have progressive shortness of breath and edema. Briefly, the patient had an appointment today with cardiology for a scheduled stress test and echocardiogram to evaluate a hx of progressive shortness of breath and edema. His stress test showed a markedly reduced ejection fraction and a follow-up complete echocardiogram confirmed this result showing an ejection fraction of 20-25%. Prior ejection fraction of 40-45%. Per chart review, at that visit the patient had endorsed a 1 month history of progressively worsening shortness of breath and lower extremity edema. The provider notes that today he was short of breath while walking from the exam room to the lobby. At his visit he denied chest pain. Given his symptomatic shortness of breath and new reduced ejection fraction on echocardiogram along with a high risk nuclear stress test he was referred to the emergency department for inpatient admission.Speaking with the patient in  the emergency department his story is consistent with above. He has had progressive shortness of breath, dyspnea on exertion and orthopnea over the previous month. He now has 3 pillow orthopnea. He has not had any chest pain and denies chest pain currently. On review of systems he denies headache, fever, night sweats, nausea, vomiting or abdominal pain, diarrhea, constipation, melena or hematochezia. He does endorse  midline lower abdominal pain of several hours duration as he has developed urinary retention this evening. He also endorses intermittent chills over the previous month.  In the emergency department the patient was hypertensive with a blood pressure 155/106, pulse 92, respiratory rate 18 and satting 100% on room air. The patient was afebrile. Laboratory evaluation revealed an elevated BNP at 2433. CBC showed an anemia with a hemoglobin of 6.6. CMP shows a bicarbonate of 16, BUN 21 and creatinine of 1.84. AST slightly elevated at 52. I-STAT troponin negative. Occult blood negative. Chest x-ray showed moderate size right pleural effusion. Atelectasis, infiltrate or consolidation right lower lobe. No pulmonary edema. The patient was given 2 units of packed red blood cells and admitted to the [facility] internal medicine teaching service.  Meds:  Current Meds  Medication Sig  . aspirin EC 81 MG EC tablet Take 1 tablet (81 mg total) by mouth daily.  Marland Kitchen atorvastatin (LIPITOR) 20 MG tablet Take 1 tablet (20 mg total) by mouth daily at 6 PM.  . clopidogrel (PLAVIX) 75 MG tablet Take 1 tablet (75 mg total) by mouth daily.  Marland Kitchen  ibuprofen (ADVIL,MOTRIN) 200 MG tablet Take 400 mg by mouth every 6 (six) hours as needed.  . metoprolol tartrate (LOPRESSOR) 25 MG tablet Take 1 tablet (25 mg total) by mouth 2 (two) times daily.  . nitroGLYCERIN (NITROSTAT) 0.4 MG SL tablet Place 1 tablet (0.4 mg total) under the tongue every 5 (five) minutes as needed for chest pain.  . pantoprazole (PROTONIX) 40 MG tablet Take  1 tablet (40 mg total) by mouth 2 (two) times daily.  . ramipril (ALTACE) 10 MG capsule TAKE 1 CAPSULE (10 MG TOTAL) BY MOUTH DAILY.  Marland Kitchen thiamine 100 MG tablet Take 1 tablet (100 mg total) by mouth daily.     Allergies: Allergies as of 08/27/2016  . (No Known Allergies)   Past Medical History:  Diagnosis Date  . Acute kidney injury (HCC)    [name] 11/21/2015  . Atrial fibrillation (HCC)    [name] 11/21/2015  . GERD (gastroesophageal reflux disease)   . GI bleed 2016   [name] 11/21/2015  . H/O syncope    recurrent episodes/notes 11/21/2015  . Hematemesis    [name] 11/21/2015  . Hepatic cirrhosis (HCC)    [name] 11/21/2015  . History of blood transfusion 2016   related to GI bleed/notes 11/21/2015  . Hypertension   . PVD (peripheral vascular disease) (HCC)    [name] 11/21/2015  . Syncope and collapse    required staples to back of head/notes 11/21/2015    Family History:  Family History  Problem Relation Age of Onset  . Cancer Mother   . Diabetes Sister   . Chronic Renal Failure Sister   . Cancer Brother      Social History:  Social History   Social History  . Marital status: Married    Spouse name: N/A  . Number of children: N/A  . Years of education: N/A   Occupational History  . Not on file.   Social History Main Topics  . Smoking status: Former Smoker    Packs/day: 0.12    Years: 55.00    Types: Cigarettes    Quit date: 11/21/2015  . Smokeless tobacco: Never Used  . Alcohol use 156.6 oz/week    93 Glasses of wine, 168 Cans of beer per week     Comment: 11/21/2015 daily, 2 - 6 pack tall cans daily beer and gallon wine 2-3 days  . Drug use: No  . Sexual activity: No   Other Topics Concern  . Not on file   Social History Narrative  . No narrative on file     Review of Systems: A complete ROS was negative except as per HPI.   Physical Exam: Blood pressure (!) 170/103, pulse 97, temperature 97.3 F (36.3 C), temperature source Oral, resp. rate 18, height   (1.676 m), weight 65.3 kg (144 lb), SpO2 100 %. Physical Exam  Constitutional:  Appears thin and frail  HENT:  Head: Normocephalic and atraumatic.  Jugular venous distention to the mandibular angle  Cardiovascular: Normal rate and regular rhythm.  Exam reveals no gallop and no friction rub.   No murmur heard. Respiratory: Effort normal. He has rales.  Crackles appreciated in the right base  GI: Soft. Bowel sounds are normal. He exhibits no distension. There is no tenderness.  Musculoskeletal: He exhibits edema.  1+ pitting edema bilaterally     EKG: Normal sinus rhythm, no acute ST segment elevation  CXR: Moderate size right pleural effusion. Atelectasis, infiltrate or consolidation right lower lobe.   Assessment &  Plan by Problem: Active Problems:   Symptomatic anemia  The patient is a 71 year old gentleman with multiple medical comorbidities who presents as a referral from his cardiologist's office for increasing dyspnea on exertion, reduced ejection fraction on echocardiogram and high risk nuclear stress test.  # Shortness of breath 2/2 to CHF exacerbation and symptomatic anemia Patient presents from cardiology office with new reduced ejection fraction, high risk nuclear stress test and progressive dyspnea on exertion over the prior month. Physical examination demonstrates jugular venous distention and bilateral lower extremity edema. Chest x-ray shows moderate right-sided pleural effusion. CBC shows hemoglobin of 6.6. I think the most likely etiology of the patient's shortness of breath is multifactorial with a component of heart failure and volume overload and symptomatic anemia from his hemoglobin of 6.6. Review of systems is negative for hematemesis, melena or hematochezia. Fecal occult blood test negative. No identifiable source of blood loss at this time. Anemia most likely a combination of anemia of chronic disease from cirrhosis, heart failure and alcohol abuse along with poor  nutrition. We'll proceed with diuresis, transfuse 2 units of packed red blood cells and follow-up anemia labs. -- Transfuse 2 units of packed red blood cells -- Furosemide 40 mg IV twice a day -- Ferritin, haptoglobin and reticulocyte count  # CAD s/p PCI to LAD ## HFrEF ## HTN Patient has a history of PCI with bare metal stent. Per cardiology will hold aspirin and Plavix given low hemoglobin. We'll also stop subcutaneous prophylactic Lovenox and change to SCDs. Office visit today shows high risk nuclear stress test and reduced ejection fraction on echocardiogram. Cardiology following. Appreciate recommendations. -- Discontinue DAPT -- Follow-up cardiology recommendations -- Atorvastatin 20 mg once daily -- Furosemide 40 mg IV twice a day -- Metoprolol 25 mg twice a day -- Ramipril 10 mg once daily  # Moderate right-sided pleural effusion Two-view diagnostic chest radiograph demonstrates right-sided moderate pleural effusion. Per radiology report no evidence of pulmonary edema. Patient may need thoracentesis for therapeutic and diagnostic purposes while inpatient pending clinical course following diuresis.  # Urinary retention Patient had episode of urinary retention while in the emergency department. Bladder scan showed 550 mL. In and out cath was performed. Patient has not had symptoms like this in the past. Unclear etiology of the patient's urinary retention. Urinalysis normal. -- Monitor clinically  # Anion gap metabolic acidosis Patient's bicarbonate is 16. Anion gap of 17. Currently no clear explanation. No sign of infection. No sign of drug intoxication. He may have an elevated lactate secondary to hypoperfusion in the setting of CHF exacerbation and symptomatic anemia. We'll also do drug screen given unclear history. -- UDS -- Follow-up  # GERD Patient has a history of gastroesophageal reflux disease. Upper endoscopy 11/2015 showed gastritis. Patient was prescribed Protonix while  on DAPT. History of gastritis and erosive gastritis may be cause of patient's anemia. Could consider further workup by GI if clinically indicated. -- Protonix 40 mg twice a day  # Alcohol abuse Patient has a history of alcohol abuse. Endorses drinking a 6 pack daily. He says his last drink was Saturday. No history of withdrawal or DTs in the past. Will monitor clinically. -- CIWA   DVT/PE prophylaxis: SCDs FEN/GI: Heart healthy Code: Full code   Dispo: Admit patient to Inpatient with expected length of stay greater than 2 midnights.  Signed: Thomasene Lot, MD 08/27/2016, 10:09 PM  Pager: [phone]

## 2016-08-27 NOTE — ED Provider Notes (Signed)
[facility]-EMERGENCY DEPT Provider Note   CSN: [id number] Arrival date & time: 08/27/16  1648     History   Chief Complaint Chief Complaint  Patient presents with  . Shortness of Breath    HPI Taedyn Montpetit is a 71 y.o. male.  HPI Patient referred from his cardiologist's office. Has one month of progressive shortness of breath. Shortness breath is worse with exertion. He's had mild nonproductive cough. Denies fevers but has occasional chills. Bilateral lower extremity swelling is chronic but daughter states has recently improved. Has history of severe peripheral vascular disease in both legs and is being followed by vascular surgery. Past Medical History:  Diagnosis Date  . Acute kidney injury (HCC)    [name] 11/21/2015  . Atrial fibrillation (HCC)    [name] 11/21/2015  . GERD (gastroesophageal reflux disease)   . GI bleed 2016   [name] 11/21/2015  . H/O syncope    recurrent episodes/notes 11/21/2015  . Hematemesis    [name] 11/21/2015  . Hepatic cirrhosis (HCC)    [name] 11/21/2015  . History of blood transfusion 2016   related to GI bleed/notes 11/21/2015  . Hypertension   . PVD (peripheral vascular disease) (HCC)    [name] 11/21/2015  . Syncope and collapse    required staples to back of head/notes 11/21/2015    Patient Active Problem List   Diagnosis Date Noted  . Shortness of breath 07/10/2016  . Claudication (HCC) 05/29/2016  . Cardiomyopathy, ischemic 01/26/2016  . Leukocytosis   . Syncope   . CAD -S/P PCI 11/29/15 11/24/2015  . Atherosclerotic PVD with intermittent claudication (HCC) 11/24/2015  . Malnutrition of moderate degree 11/22/2015  . Syncope and collapse 11/21/2015  . Atrial fibrillation (HCC) 11/21/2015  . Hypokalemia 11/21/2015  . HTN (hypertension) 11/21/2015  . Hepatic cirrhosis (HCC) 11/21/2015  . PUD (peptic ulcer disease) 11/21/2015  . AKI (acute kidney injury) (HCC) 11/21/2015  . Laceration of head 11/21/2015  . Tobacco use disorder 11/21/2015  . Alcohol  abuse 11/21/2015    Past Surgical History:  Procedure Laterality Date  . CARDIAC CATHETERIZATION N/A 11/24/2015   Procedure: Left Heart Cath and Coronary Angiography;  Surgeon: Lyn Records, MD;  Location: [facility] INVASIVE CV LAB;  Service: Cardiovascular;  Laterality: N/A;  . CARDIAC CATHETERIZATION N/A 11/29/2015   Procedure: Coronary Stent Intervention ;  Surgeon: Kathleene Hazel, MD;  Location: [facility] INVASIVE CV LAB;  Service: Cardiovascular;  Laterality: N/A;  . COLONOSCOPY    . ESOPHAGOGASTRODUODENOSCOPY    . ESOPHAGOGASTRODUODENOSCOPY (EGD) WITH PROPOFOL N/A 11/27/2015   Procedure: ESOPHAGOGASTRODUODENOSCOPY (EGD) WITH PROPOFOL;  Surgeon: Willis Modena, MD;  Location: [facility] ENDOSCOPY;  Service: Endoscopy;  Laterality: N/A;       Home Medications    Prior to Admission medications   Medication Sig Start Date End Date Taking? Authorizing Provider  aspirin EC 81 MG EC tablet Take 1 tablet (81 mg total) by mouth daily. 12/01/15  Yes Maryann Mikhail, DO  atorvastatin (LIPITOR) 20 MG tablet Take 1 tablet (20 mg total) by mouth daily at 6 PM. 05/29/16  Yes Abelino Derrick, PA-C  clopidogrel (PLAVIX) 75 MG tablet Take 1 tablet (75 mg total) by mouth daily. 07/10/16  Yes Runell Gess, MD  ibuprofen (ADVIL,MOTRIN) 200 MG tablet Take 400 mg by mouth every 6 (six) hours as needed.   Yes Historical Provider, MD  metoprolol tartrate (LOPRESSOR) 25 MG tablet Take 1 tablet (25 mg total) by mouth 2 (two) times daily. 04/22/16  Yes Runell Gess,  MD  nitroGLYCERIN (NITROSTAT) 0.4 MG SL tablet Place 1 tablet (0.4 mg total) under the tongue every 5 (five) minutes as needed for chest pain. 12/01/15  Yes Maryann Mikhail, DO  pantoprazole (PROTONIX) 40 MG tablet Take 1 tablet (40 mg total) by mouth 2 (two) times daily. 12/01/15  Yes Maryann Mikhail, DO  ramipril (ALTACE) 10 MG capsule TAKE 1 CAPSULE (10 MG TOTAL) BY MOUTH DAILY. 08/15/16 11/13/16 Yes Runell Gess, MD  thiamine 100 MG tablet Take 1 tablet  (100 mg total) by mouth daily. 12/01/15  Yes Maryann Mikhail, DO    Family History Family History  Problem Relation Age of Onset  . Cancer Mother   . Diabetes Sister   . Chronic Renal Failure Sister   . Cancer Brother     Social History Social History  Substance Use Topics  . Smoking status: Former Smoker    Packs/day: 0.12    Years: 55.00    Types: Cigarettes    Quit date: 11/21/2015  . Smokeless tobacco: Never Used  . Alcohol use 156.6 oz/week    93 Glasses of wine, 168 Cans of beer per week     Comment: 11/21/2015 daily, 2 - 6 pack tall cans daily beer and gallon wine 2-3 days     Allergies   Patient has no known allergies.   Review of Systems Review of Systems  Constitutional: Positive for chills and fatigue. Negative for fever.  Respiratory: Positive for cough and shortness of breath.   Cardiovascular: Positive for leg swelling. Negative for chest pain and palpitations.  Gastrointestinal: Negative for abdominal pain, constipation, diarrhea, nausea and vomiting.  Genitourinary: Negative for dysuria and flank pain.  Musculoskeletal: Negative for back pain, myalgias, neck pain and neck stiffness.  Skin: Negative for rash and wound.  Neurological: Negative for dizziness, weakness, light-headedness, numbness and headaches.  All other systems reviewed and are negative.    Physical Exam Updated Vital Signs BP (!) 155/106   Pulse 92   Temp 97.3 F (36.3 C) (Oral)   Resp 18   Ht 5\' 6"  (1.676 m)   Wt 144 lb (65.3 kg)   SpO2 100%   BMI 23.24 kg/m   Physical Exam  Constitutional: He is oriented to person, place, and time. He appears well-developed and well-nourished. No distress.  HENT:  Head: Normocephalic and atraumatic.  Mouth/Throat: Oropharynx is clear and moist. No oropharyngeal exudate.  Eyes: EOM are normal. Pupils are equal, round, and reactive to light.  Neck: Normal range of motion. Neck supple. JVD present.  Cardiovascular: Normal rate and regular  rhythm.  Exam reveals no gallop and no friction rub.   Pulmonary/Chest: Effort normal.  Diminished breath sounds in right base.  Abdominal: Soft. Bowel sounds are normal. There is no tenderness. There is no rebound and no guarding.  Musculoskeletal: Normal range of motion. He exhibits edema. He exhibits no tenderness.  3+ bilateral lower extremity pitting edema. Patient has no palpable dorsalis pedis and posterior tibial pulses bilaterally. Both feet are cool to touch. Delayed cap refill.  Neurological: He is alert and oriented to person, place, and time.  Skin: Skin is warm and dry. No rash noted. No erythema.  Psychiatric: He has a normal mood and affect. His behavior is normal.  Stoic mood  Nursing note and vitals reviewed.    ED Treatments / Results  Labs (all labs ordered are listed, but only abnormal results are displayed) Labs Reviewed  CBC WITH DIFFERENTIAL/PLATELET - Abnormal; Notable for the  following:       Result Value   RBC 3.76 (*)    Hemoglobin 6.6 (*)    HCT 23.1 (*)    MCV 61.4 (*)    MCH 17.6 (*)    MCHC 28.6 (*)    RDW 21.1 (*)    All other components within normal limits  COMPREHENSIVE METABOLIC PANEL - Abnormal; Notable for the following:    CO2 16 (*)    BUN 21 (*)    Creatinine, Ser 1.84 (*)    Calcium 6.6 (*)    Albumin 3.4 (*)    AST 52 (*)    GFR calc non Af Amer 35 (*)    GFR calc Af Amer 41 (*)    Anion gap 17 (*)    All other components within normal limits  BRAIN NATRIURETIC PEPTIDE - Abnormal; Notable for the following:    B Natriuretic Peptide 2,433.0 (*)    All other components within normal limits  I-STAT TROPOININ, ED  POC OCCULT BLOOD, ED  TYPE AND SCREEN  PREPARE RBC (CROSSMATCH)    EKG  EKG Interpretation  Date/Time:  Tuesday August 27 2016 16:54:02 EDT Ventricular Rate:  92 PR Interval:  164 QRS Duration: 82 QT Interval:  360 QTC Calculation: 445 R Axis:   138 Text Interpretation:  Normal sinus rhythm Right axis deviation  Anterior infarct , age undetermined Abnormal ECG Confirmed by Ranae Palms  MD, Mya Suell ([id number]) on 08/27/2016 6:09:59 PM       Radiology Dg Chest 2 View  Result Date: 08/27/2016 CLINICAL DATA:  Shortness of breath for 1 week, cough EXAM: CHEST  2 VIEW COMPARISON:  12/01/2015 FINDINGS: There is moderate-sized right pleural effusion. There is atelectasis, consolidation or infiltrate in right lower lobe. No pulmonary edema. Ortho fracture of the left sixth, seventh and eighth ribs. IMPRESSION: Moderate-sized right pleural effusion. Atelectasis, infiltrate or consolidation in right lower lobe. No pulmonary edema. Electronically Signed   By: Natasha Mead M.D.   On: 08/27/2016 17:17    Procedures Procedures (including critical care time)  Medications Ordered in ED Medications  potassium chloride SA (K-DUR,KLOR-CON) CR tablet 20 mEq (20 mEq Oral Given 08/27/16 2019)  furosemide (LASIX) injection 40 mg (40 mg Intravenous Given 08/27/16 1810)  0.9 %  sodium chloride infusion (10 mL/hr Intravenous New Bag/Given 08/27/16 2020)   CRITICAL CARE Performed by: Ranae Palms, Kindal Ponti Total critical care time: 40 minutes Critical care time was exclusive of separately billable procedures and treating other patients. Critical care was necessary to treat or prevent imminent or life-threatening deterioration. Critical care was time spent personally by me on the following activities: development of treatment plan with patient and/or surrogate as well as nursing, discussions with consultants, evaluation of patient's response to treatment, examination of patient, obtaining history from patient or surrogate, ordering and performing treatments and interventions, ordering and review of laboratory studies, ordering and review of radiographic studies, pulse oximetry and re-evaluation of patient's condition.  Initial Impression / Assessment and Plan / ED Course  I have reviewed the triage vital signs and the nursing  notes.  Pertinent labs & imaging results that were available during my care of the patient were reviewed by me and considered in my medical decision making (see chart for details).     Discussed with Dr. Mayford Knife. Aware the patient is anemic and this is likely to his dyspnea. Hemoccult negative. No head and start slowly transfusing patient. Also appears to be fluid overloaded. Given dose of IV Lasix  in the emergency department. He's having trouble urinating. Bladder scan shows greater than 500 mL in the bladder. Will place the catheter. Internal medicine teaching service will admit patient.  Final Clinical Impressions(s) / ED Diagnoses   Final diagnoses:  Anemia, unspecified type  Acute on chronic diastolic congestive heart failure [facility])    New Prescriptions New Prescriptions   No medications on file     Loren Racer, MD 08/27/16 2053

## 2016-08-27 NOTE — H&P (Signed)
History & Physical    Patient ID: Derrick Ramos MRN: [id number], DOB/AGE: 73-15-1947   Admit date: 08/27/2016   Primary Physician: Delbert Harness, MD Primary Cardiologist: Dr Allyson Sabal   History of Present Illness    Derrick Ramos is a 71 y.o. male with past medical history of atrial fibrillation, LV dysfunction with EF 40-45%,  hypertension, self-reported cirrhosis, daily alcohol use, tobacco abuse, GERD and PAD. He presented to the office today for a stress test and echo to evaluate for progressive shortness of breath and edema. The stress test showed a markedly reduced EF. Due to his symptoms and LV dysfunction he will be admitted to the hospital. Echo results are pending.   The patient has had about a 3 month history of progressively worsening shortness of breath and lower extremity edema. Today he was short of breath walking from the exam room to the lobby. He has had no chest pain or orthopnea.  Cardiac catheterization on 11/24/2015 which showed 80% ostial to proximal LAD lesion, 50% ostial D1 lesion, 60% mid LAD lesion, 25% proximal left circumflex lesion. Was not felt to be good surgical candidate due to single vessel CAD and also his cirrhosis and extremely heavy alcohol history. He underwent PCI of LAD 11/29/15. He was put on DAPT and not anticoagulated due to increased bleeding risk. Plan to revisit anticoagulation when comes off DAPT. He quit smoking and drinking at that time. Now back to drinking.   Past Medical History    Past Medical History:  Diagnosis Date  . Acute kidney injury (HCC)    [name] 11/21/2015  . Atrial fibrillation (HCC)    [name] 11/21/2015  . GERD (gastroesophageal reflux disease)   . GI bleed 2016   [name] 11/21/2015  . H/O syncope    recurrent episodes/notes 11/21/2015  . Hematemesis    [name] 11/21/2015  . Hepatic cirrhosis (HCC)    [name] 11/21/2015  . History of blood transfusion 2016   related to GI bleed/notes 11/21/2015  . Hypertension   . PVD (peripheral  vascular disease) (HCC)    [name] 11/21/2015  . Syncope and collapse    required staples to back of head/notes 11/21/2015    Past Surgical History:  Procedure Laterality Date  . CARDIAC CATHETERIZATION N/A 11/24/2015   Procedure: Left Heart Cath and Coronary Angiography;  Surgeon: Lyn Records, MD;  Location: [facility] INVASIVE CV LAB;  Service: Cardiovascular;  Laterality: N/A;  . CARDIAC CATHETERIZATION N/A 11/29/2015   Procedure: Coronary Stent Intervention ;  Surgeon: Kathleene Hazel, MD;  Location: [facility] INVASIVE CV LAB;  Service: Cardiovascular;  Laterality: N/A;  . COLONOSCOPY    . ESOPHAGOGASTRODUODENOSCOPY    . ESOPHAGOGASTRODUODENOSCOPY (EGD) WITH PROPOFOL N/A 11/27/2015   Procedure: ESOPHAGOGASTRODUODENOSCOPY (EGD) WITH PROPOFOL;  Surgeon: Willis Modena, MD;  Location: [facility] ENDOSCOPY;  Service: Endoscopy;  Laterality: N/A;     Allergies  Allergies  Allergen Reactions  . No Known Allergies      Home Medications    Prior to Admission medications   Medication Sig Start Date End Date Taking? Authorizing Provider  aspirin EC 81 MG EC tablet Take 1 tablet (81 mg total) by mouth daily. 12/01/15   Maryann Mikhail, DO  atorvastatin (LIPITOR) 20 MG tablet Take 1 tablet (20 mg total) by mouth daily at 6 PM. 05/29/16   Abelino Derrick, PA-C  clopidogrel (PLAVIX) 75 MG tablet Take 1 tablet (75 mg total) by mouth daily. 07/10/16   Runell Gess, MD  loratadine Tomma Lightning)  10 MG tablet Take 1 tablet by mouth daily. 07/09/16 10/07/16  Historical Provider, MD  metoprolol tartrate (LOPRESSOR) 25 MG tablet Take 1 tablet (25 mg total) by mouth 2 (two) times daily. 04/22/16   Runell Gess, MD  nitroGLYCERIN (NITROSTAT) 0.4 MG SL tablet Place 1 tablet (0.4 mg total) under the tongue every 5 (five) minutes as needed for chest pain. 12/01/15   Maryann Mikhail, DO  pantoprazole (PROTONIX) 40 MG tablet Take 1 tablet (40 mg total) by mouth 2 (two) times daily. 12/01/15   Maryann Mikhail, DO  ramipril (ALTACE)  10 MG capsule TAKE 1 CAPSULE (10 MG TOTAL) BY MOUTH DAILY. 08/15/16 11/13/16  Runell Gess, MD  thiamine 100 MG tablet Take 1 tablet (100 mg total) by mouth daily. 12/01/15   Edsel Petrin, DO    Family History    Family History  Problem Relation Age of Onset  . Cancer Mother   . Diabetes Sister   . Chronic Renal Failure Sister   . Cancer Brother     Social History    Social History   Social History  . Marital status: Married    Spouse name: N/A  . Number of children: N/A  . Years of education: N/A   Occupational History  . Not on file.   Social History Main Topics  . Smoking status: Former Smoker    Packs/day: 0.12    Years: 55.00    Types: Cigarettes    Quit date: 11/21/2015  . Smokeless tobacco: Never Used  . Alcohol use 156.6 oz/week    93 Glasses of wine, 168 Cans of beer per week     Comment: 11/21/2015 daily, 2 - 6 pack tall cans daily beer and gallon wine 2-3 days  . Drug use: No  . Sexual activity: No   Other Topics Concern  . Not on file   Social History Narrative  . No narrative on file     Review of Systems    General:  No chills, fever, night sweats or weight changes.  Cardiovascular:  Positive for dyspnea on exertion, lower extremity edema, No chest pain, orthopnea, palpitations, paroxysmal nocturnal dyspnea. Dermatological: No rash, lesions/masses Respiratory: No cough, positive for dyspnea on exertion Urologic: No hematuria, dysuria Abdominal:   No nausea, vomiting, diarrhea, bright red blood per rectum, melena, or hematemesis Neurologic:  No visual changes, wkns, changes in mental status. All other systems reviewed and are otherwise negative except as noted above.  Physical Exam    Blood pressure (!) 155/91, pulse 88, temperature 97.3 F (36.3 C), temperature source Oral, resp. rate (!) 24, height 5\' 6"  (1.676 m), weight 144 lb (65.3 kg), SpO2 100 %.  General: Well developed, well nourished,male in no acute distress. Head: Normocephalic,  atraumatic, sclera non-icteric, no xanthomas, nares are without discharge. Dentition:  Neck: No carotid bruits. JVD is elevated  Lungs: Respirations regular and unlabored, without wheezes or rales.  Heart: Regular rate and rhythm. Summation gallop.  No murmur, no rubs, or gallops appreciated. Abdomen: Soft, non-tender, non-distended with normoactive bowel sounds. No hepatomegaly. No rebound/guarding. No obvious abdominal masses. Msk:  Strength and tone appear normal for age. No joint deformities or effusions. Extremities: Positive for clubbing of fingers and toes. 2+ pitting edema.  Distal pedal pulses are 2+ bilaterally. Dry lesion on dorsum of left foot Neuro: Alert and oriented X 3. Moves all extremities spontaneously. No focal deficits noted. Psych:  Responds to questions appropriately with a normal affect. Skin: No rashes  or lesions noted  Labs    Troponin (Point of Care Test) No results for input(s): TROPIPOC in the last 72 hours. No results for input(s): CKTOTAL, CKMB, TROPONINI in the last 72 hours. Lab Results  Component Value Date   WBC 8.2 12/14/2015   HGB 10.3 (L) 12/14/2015   HCT 31.7 (L) 12/14/2015   MCV 95.2 12/14/2015   PLT 490 (H) 12/14/2015   No results for input(s): NA, K, CL, CO2, BUN, CREATININE, CALCIUM, PROT, BILITOT, ALKPHOS, ALT, AST, GLUCOSE in the last 168 hours.  Invalid input(s): LABALBU Lab Results  Component Value Date   CHOL 117 11/28/2015   HDL 42 11/28/2015   LDLCALC 60 11/28/2015   TRIG 73 11/28/2015   Lab Results  Component Value Date   DDIMER 1.43 (H) 11/25/2015    No results found for: BNP No results found for: PROBNP No results for input(s): INR in the last 72 hours.    Radiology Studies    Dg Chest 2 View  Result Date: 08/27/2016 CLINICAL DATA:  Shortness of breath for 1 week, cough EXAM: CHEST  2 VIEW COMPARISON:  12/01/2015 FINDINGS: There is moderate-sized right pleural effusion. There is atelectasis, consolidation or  infiltrate in right lower lobe. No pulmonary edema. Ortho fracture of the left sixth, seventh and eighth ribs. IMPRESSION: Moderate-sized right pleural effusion. Atelectasis, infiltrate or consolidation in right lower lobe. No pulmonary edema. Electronically Signed   By: Natasha Mead M.D.   On: 08/27/2016 17:17    EKG & Cardiac Imaging    EKG: Normal sinus rhythm, 92 bpm, late r wave progression. Right axis deviation  ECHOCARDIOGRAM: in progress  Nuclear stess test 08/27/2016 Study Highlights     The left ventricular ejection fraction is severely decreased (<30%).  Nuclear stress EF: 21%.  There was no ST segment deviation noted during stress.  Defect 1: There is a small defect of severe severity present in the apex location.  This is a high risk study.   High risk stress nuclear study with apical thinning but no ischemia; EF 21 with global hypokinesis and severe LVE; study interpreted as high risk due to reduced LV function.      Assessment & Plan    Acute systolic CHF -Pt has mixed ischemic and nonischemic cardiomyopathy. Today was noted to have significant LV dysfunction on out patient stress test in setting of progressively worsening DOE and lower extremity edema. Echo was done at the office, pending results.  -Admitting for diuresis and probable cath when symptoms improve. -Initiating lasix 40 mg IV bid. -SCr 1.84, K+ 3.7. Monitor labs.  CAD -PCI to LAD in 11/2015 -Continue Plavix, aspirin 81 mg, statin, BB  PAF -Hx of PAF which spontaneously converted. Anticoagulation was not pursued because of his history of alcohol use and cirrhosis. -currently in sinus rhythm   Hypertension -home meds include ramipril 10 mg daily, metoprolol 25 mg bid. Will continue.  Hyperlipidemia -On lipitor 20 mg  Oletta Cohn, NP-C 08/27/2016, 5:47 PM Pager: [phone]  Agree with note by Berton Bon NP-C  Pt well known to me with mixed ischemic/ NISCM with long  H/O ETOH abuse and cirrhosis. Had prox LAD BMS last year . 2D echo slowed worsened LV fxn with EF 20-25 % with mod-sev TR. He has had progressive DOE for sev months. Recent MV was non ischemic. Today in ER he is volume overloaded with elevated JVP and 2-3 + pitting BLEE with a summation gallup. Will need to admit for  diuresis.    Runell Gess, M.D., FACP, [facility], [facility] [address] [place], [place]  [address]  [phone] 08/28/2016 11:47 AM

## 2016-08-27 NOTE — ED Triage Notes (Signed)
Per PT, [facility]. Pt reports feeling SOB x 1 week. Pt denies chest pain or cough, congestion.

## 2016-08-27 NOTE — ED Notes (Signed)
Cardiology at bedside.

## 2016-08-28 ENCOUNTER — Inpatient Hospital Stay (HOSPITAL_COMMUNITY): Payer: Medicare Other

## 2016-08-28 ENCOUNTER — Encounter (HOSPITAL_COMMUNITY): Payer: Self-pay | Admitting: General Practice

## 2016-08-28 DIAGNOSIS — Z841 Family history of disorders of kidney and ureter: Secondary | ICD-10-CM

## 2016-08-28 DIAGNOSIS — I5033 Acute on chronic diastolic (congestive) heart failure: Secondary | ICD-10-CM

## 2016-08-28 DIAGNOSIS — K219 Gastro-esophageal reflux disease without esophagitis: Secondary | ICD-10-CM

## 2016-08-28 DIAGNOSIS — E872 Acidosis: Secondary | ICD-10-CM

## 2016-08-28 DIAGNOSIS — I071 Rheumatic tricuspid insufficiency: Secondary | ICD-10-CM

## 2016-08-28 DIAGNOSIS — Z833 Family history of diabetes mellitus: Secondary | ICD-10-CM

## 2016-08-28 DIAGNOSIS — Z955 Presence of coronary angioplasty implant and graft: Secondary | ICD-10-CM

## 2016-08-28 DIAGNOSIS — R0602 Shortness of breath: Secondary | ICD-10-CM

## 2016-08-28 DIAGNOSIS — R160 Hepatomegaly, not elsewhere classified: Secondary | ICD-10-CM

## 2016-08-28 DIAGNOSIS — Z7902 Long term (current) use of antithrombotics/antiplatelets: Secondary | ICD-10-CM

## 2016-08-28 DIAGNOSIS — Z7982 Long term (current) use of aspirin: Secondary | ICD-10-CM

## 2016-08-28 DIAGNOSIS — Z809 Family history of malignant neoplasm, unspecified: Secondary | ICD-10-CM

## 2016-08-28 DIAGNOSIS — F17211 Nicotine dependence, cigarettes, in remission: Secondary | ICD-10-CM

## 2016-08-28 DIAGNOSIS — I110 Hypertensive heart disease with heart failure: Principal | ICD-10-CM

## 2016-08-28 DIAGNOSIS — R339 Retention of urine, unspecified: Secondary | ICD-10-CM

## 2016-08-28 DIAGNOSIS — F1020 Alcohol dependence, uncomplicated: Secondary | ICD-10-CM

## 2016-08-28 DIAGNOSIS — D649 Anemia, unspecified: Secondary | ICD-10-CM

## 2016-08-28 DIAGNOSIS — D509 Iron deficiency anemia, unspecified: Secondary | ICD-10-CM

## 2016-08-28 DIAGNOSIS — J90 Pleural effusion, not elsewhere classified: Secondary | ICD-10-CM

## 2016-08-28 LAB — COMPREHENSIVE METABOLIC PANEL
ALBUMIN: 3.2 g/dL — AB (ref 3.5–5.0)
ALK PHOS: 115 U/L (ref 38–126)
ALT: 47 U/L (ref 17–63)
AST: 51 U/L — AB (ref 15–41)
Anion gap: 13 (ref 5–15)
BUN: 21 mg/dL — ABNORMAL HIGH (ref 6–20)
CHLORIDE: 107 mmol/L (ref 101–111)
CO2: 20 mmol/L — AB (ref 22–32)
CREATININE: 1.76 mg/dL — AB (ref 0.61–1.24)
Calcium: 6.5 mg/dL — ABNORMAL LOW (ref 8.9–10.3)
GFR calc Af Amer: 43 mL/min — ABNORMAL LOW (ref >60)
GFR calc non Af Amer: 37 mL/min — ABNORMAL LOW (ref >60)
GLUCOSE: 88 mg/dL (ref 65–99)
Potassium: 3.6 mmol/L (ref 3.5–5.1)
SODIUM: 140 mmol/L (ref 135–145)
Total Bilirubin: 1.6 mg/dL — ABNORMAL HIGH (ref 0.3–1.2)
Total Protein: 6.9 g/dL (ref 6.5–8.1)

## 2016-08-28 LAB — PROTEIN, PLEURAL OR PERITONEAL FLUID

## 2016-08-28 LAB — CBC
HCT: 27.6 % — ABNORMAL LOW (ref 39.0–52.0)
HEMOGLOBIN: 8.3 g/dL — AB (ref 13.0–17.0)
MCH: 19.8 pg — ABNORMAL LOW (ref 26.0–34.0)
MCHC: 30.1 g/dL (ref 30.0–36.0)
MCV: 65.7 fL — ABNORMAL LOW (ref 78.0–100.0)
PLATELETS: 294 10*3/uL (ref 150–400)
RBC: 4.2 MIL/uL — AB (ref 4.22–5.81)
RDW: 26.1 % — ABNORMAL HIGH (ref 11.5–15.5)
WBC: 6.5 10*3/uL (ref 4.0–10.5)

## 2016-08-28 LAB — RETICULOCYTES
RBC.: 3.73 MIL/uL — AB (ref 4.22–5.81)
RETIC COUNT ABSOLUTE: 29.8 10*3/uL (ref 19.0–186.0)
RETIC CT PCT: 0.8 % (ref 0.4–3.1)

## 2016-08-28 LAB — LACTATE DEHYDROGENASE: LDH: 380 U/L — AB (ref 98–192)

## 2016-08-28 LAB — BODY FLUID CELL COUNT WITH DIFFERENTIAL
Eos, Fluid: 0 %
Lymphs, Fluid: 67 %
Monocyte-Macrophage-Serous Fluid: 30 % — ABNORMAL LOW (ref 50–90)
Neutrophil Count, Fluid: 3 % (ref 0–25)
Total Nucleated Cell Count, Fluid: 210 cu mm (ref 0–1000)

## 2016-08-28 LAB — PROTIME-INR
INR: 1.44
Prothrombin Time: 17.7 seconds — ABNORMAL HIGH (ref 11.4–15.2)

## 2016-08-28 LAB — LACTATE DEHYDROGENASE, PLEURAL OR PERITONEAL FLUID: LD, Fluid: 106 U/L — ABNORMAL HIGH (ref 3–23)

## 2016-08-28 LAB — APTT: aPTT: 32 seconds (ref 24–36)

## 2016-08-28 LAB — TROPONIN I
TROPONIN I: 0.03 ng/mL — AB (ref <0.03)
Troponin I: 0.03 ng/mL (ref <0.03)

## 2016-08-28 LAB — FERRITIN: FERRITIN: 7 ng/mL — AB (ref 24–336)

## 2016-08-28 MED ORDER — GUAIFENESIN 200 MG PO TABS
200.0000 mg | ORAL_TABLET | ORAL | Status: DC | PRN
  Administered 2016-08-28: 200 mg via ORAL
  Filled 2016-08-28 (×2): qty 1

## 2016-08-28 MED ORDER — POLYSACCHARIDE IRON COMPLEX 150 MG PO CAPS
150.0000 mg | ORAL_CAPSULE | Freq: Every day | ORAL | Status: DC
  Administered 2016-08-28 – 2016-08-31 (×4): 150 mg via ORAL
  Filled 2016-08-28 (×4): qty 1

## 2016-08-28 NOTE — Procedures (Signed)
Thoracentesis Procedure Note  Pre-operative Diagnosis:  Pleural Effusion  Indications:  Dyspnea, diagnosis  Procedure Details:  Informed consent was obtained after explanation of the risks and benefits of the procedure, refer to the consent documentation.  Time-out Performed immediately prior to the procedure.  All available chest radiographs were reviewed and the patient was subsequently placed in a sitting position.  Using ultrasound guidance a large) pleural effusion was noted on the right.  The area was prepped with chlorhexadine and draped in sterile fashion.  Following this 1% lidocaine was injected subcutaneously and deep to provide anesthesia.  A small incision was then made parallel and superior to the rib, the thoracentesis needle with catheter was inserted into the chest wall and advanced under constant aspiration.  Upon aspiration of pleural fluid, the catheter was advanced into the pleural space and the needle was removed.  The catheter was then connected to a drainange bag and the fluid was removed under manual drainage. The catheter was then removed during slow forced exhalation and a sterile bandage was placed.  Findings:   1400 mL of pleural fluid was removed.  Fluid was sent for LDH, protein, cell count.  Condition: The patient tolerated the procedure well and remains in the same condition as pre-procedure.  Complications: None; patient tolerated the procedure well.  Plan: A follow up chest x-ray was ordered to evaluate for pneumothorax and change in pleural effusion.

## 2016-08-28 NOTE — Progress Notes (Signed)
Patient's bladder scanned; volume 165.

## 2016-08-28 NOTE — Progress Notes (Signed)
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 Clifton James  11/29/15. He has a history of hypertension, tobacco abuse, GERD and PUD. His EF has been moderately reduced the past because of progressive dyspnea he had a Myoview stress test was nonischemic but had severe LV dysfunction and a 2-D echo showed an EF of 20% which was new. Patient has any symptoms of heart failure he was admitted. He denies chest pain.  Inpatient Medications    Scheduled Meds: . atorvastatin  20 mg Oral q1800  . folic acid  1 mg Oral Daily  . furosemide  40 mg Intravenous BID  . iron polysaccharides  150 mg Oral Daily  . loratadine  10 mg Oral Daily  . multivitamin with minerals  1 tablet Oral Daily  . pantoprazole  40 mg Oral BID  . potassium chloride  20 mEq Oral Daily  . ramipril  10 mg Oral Daily  . sodium chloride flush  3 mL Intravenous Q12H  . thiamine  100 mg Oral Daily   Or  . thiamine  100 mg Intravenous Daily   Continuous Infusions:  PRN Meds: sodium chloride, acetaminophen, nitroGLYCERIN, ondansetron (ZOFRAN) IV, sodium chloride flush   Vital Signs    Vitals:   08/28/16 0431 08/28/16 0446 08/28/16 0527 08/28/16 0745  BP: (!) 152/93 (!) 154/101 (!) 151/92 (!) 157/94  Pulse: 92 89 90 91  Resp: Temp: 98.1 F (36.7 C) 98.5 F (36.9 C) 98.5 F (36.9 C) 98.1 F (36.7 C)  TempSrc: Oral Oral Oral Oral  SpO2: 100%  100% 98%  Weight:      Height:        Intake/Output Summary (Last 24 hours) at 08/28/16 1159 Last data filed at 08/28/16 1157  Gross per 24 hour  Intake              690 ml  Output             1300 ml  Net             -610 ml   Filed Weights   08/27/16 1654 08/27/16 2326  Weight: 144 lb (65.3 kg) 140 lb (63.5 kg)     Telemetry    Normal sinus rhythm - Personally Reviewed  ECG    Not performed today - Personally Reviewed  Physical Exam   GEN: No acute distress.   Neck: Elevated jugular venous pressure Cardiac: RRR, no murmurs, rubs, S3 summation gallop  Respiratory:  decreased breath sounds at the base right greater than left GI: Soft, nontender, non-distended  MS: No edema; No deformity. Neuro:  Nonfocal  Psych: Normal affect  Extremities: 2+ pitting edema bilaterally  Labs    Chemistry Recent Labs Lab 08/27/16 1805 08/28/16 0047  NA 139 140  K 3.7 3.6  CL 106 107  CO2 16* 20*  GLUCOSE 93 88  BUN 21* 21*  CREATININE 1.84* 1.76*  CALCIUM 6.6* 6.5*  PROT 7.3 6.9  ALBUMIN 3.4* 3.2*  AST 52* 51*  ALT 46 47  ALKPHOS 124 115  BILITOT 1.2 1.6*  GFRNONAA 35* 37*  GFRAA 41* 43*  ANIONGAP 17* 13     Hematology Recent Labs Lab 08/27/16 1805 08/28/16 0047 08/28/16 0617  WBC 6.9  --  6.5  RBC  3.76* 3.73* 4.20*  HGB 6.6*  --  8.3*  HCT 23.1*  --  27.6*  MCV 61.4*  --  65.7*  MCH 17.6*  --  19.8*  MCHC 28.6*  --  30.1  RDW 21.1*  --  26.1*  PLT 383  --  294    Cardiac Enzymes Recent Labs Lab 08/28/16 0047 08/28/16 0617  TROPONINI 0.03* 0.03*    Recent Labs Lab 08/27/16 1917  TROPIPOC 0.00     BNP Recent Labs Lab 08/27/16 1750  BNP 2,433.0*     DDimer No results for input(s): DDIMER in the last 168 hours.   Radiology    Dg Chest 2 View  Result Date: 08/27/2016 CLINICAL DATA:  Shortness of breath for 1 week, cough EXAM: CHEST  2 VIEW COMPARISON:  12/01/2015 FINDINGS: There is moderate-sized right pleural effusion. There is atelectasis, consolidation or infiltrate in right lower lobe. No pulmonary edema. Ortho fracture of the left sixth, seventh and eighth ribs. IMPRESSION: Moderate-sized right pleural effusion. Atelectasis, infiltrate or consolidation in right lower lobe. No pulmonary edema. Electronically Signed   By: Natasha Mead M.D.   On: 08/27/2016  17:17    Cardiac Studies   2D Echo-   Study Conclusions  - Left ventricle: The cavity size was normal. Wall thickness was   increased in a pattern of moderate LVH. Systolic function was   severely reduced. The estimated ejection fraction was in the   range of 20% to 25%. Diffuse hypokinesis. The study is not   technically sufficient to allow evaluation of LV diastolic   function. - Aortic valve: Moderately calcified. No stenosis. Mild   regurgitation. - Aorta: Mildly dilated ascending aorta. Aortic root dimension: 39   mm (ED). Ascending aortic diameter: 41 mm (S). - Mitral valve: There was mild regurgitation. - Left atrium: Moderately dilated. - Right ventricle: The cavity size was mildly dilated. - Right atrium: Severely dilated. - Tricuspid valve: There was moderate to severe eccentric   regurgitation, directed at the interatrial septum. - Pulmonary arteries: PA peak pressure: 50 mm Hg (S). - Pericardium, extracardiac: A trivial pericardial effusion was   identified posterior to the heart. Features were not consistent   with tamponade physiology. There was a right pleural effusion.   There was a left pleural effusion.  Impressions:  - LVEF 20-25%, moderate LVH, global hypokinesis, mild AI (probably   mild aortic stenosis), dilated aorta to 4.1 cm, mild MR, moderate   LAE, mild RVE, severe RAE, moderate to severe eccentric TR, RVSP   50 mmHg, bilateral pleural effusions, trivial pericardial   effusion without tamponade features.   Patient Profile     71 y.o. male with a history of alcohol abuse, cirrhosis, mixed ischemic and nonischemic cardiomyopathy status post bare metal stenting of his proximal LAD July 2017. He's had progressive dyspnea with recent Myoview that showed no ischemia but severe LV dysfunction. 2-D echo revealed an EF of 20-25%  with severe TR and RV dilatation. He was admitted because of this and symptoms of heart failure. He had elevated jugular  venous pressure and peripheral edema. Chest x-ray did show a large right pleural effusion and blood work showed a hemoglobin of 6.6. He was transfused 2 units of packed red blood cells with a resulting hemoglobin of 8.3. She is getting Lasix IV twice a day with mild diuresis. Thoracentesis is planned. At some point once he is euvolemic we will plan right and left heart cath to further define his  anatomy and physiology.  Assessment & Plan    1: Ischemic cardiomyopathy-history of mixed ischemic and nonischemic cardiomyopathy probably related to ethanol abuse. His EF has declined to 20-25% range with a recent nonischemic Myoview. He has signs and symptoms of volume overload. We will aggressively diurese him, follow his renal function which is moderately impaired at this point at 1.7. He'll need a right and left heart cath once he is euvolemic.  2: Iron deficiency anemia-probably multifactorial from a GI source however worry about other etiologies such as varices with history of cirrhosis. He has had endoscopy in the past. He's had transfusion 2 units packed red blood cells with a resulting increase in his hemoglobin from 6.6 up to 8.3. Agree with initiation of H2 blockers.  3: Dyspnea on exertion-probably multifactorial from systolic heart failure and volume overload as well as a large right pleural effusion. Agree with diagnostic and therapeutic right thoracentesis.  We will follow with you  Signed, [name], MD  08/28/2016, 11:59 AM

## 2016-08-28 NOTE — Progress Notes (Signed)
CCMD notified patient had 10-beat run of Vtach.  Patient asymptomatic.  MD notified.

## 2016-08-28 NOTE — Progress Notes (Signed)
Subjective: No acute events overnight.  Feeling better this morning, breathing somewhat improved.  Objective:  Vital signs in last 24 hours: Vitals:   08/28/16 0431 08/28/16 0446 08/28/16 0527 08/28/16 0745  BP: (!) 152/93 (!) 154/101 (!) 151/92 (!) 157/94  Pulse: 92 89 90 91  Resp: Temp: 98.1 F (36.7 C) 98.5 F (36.9 C) 98.5 F (36.9 C) 98.1 F (36.7 C)  TempSrc: Oral Oral Oral Oral  SpO2: 100%  100% 98%  Weight:      Height:       Physical Exam  Constitutional: He is oriented to person, place, and time. He appears well-developed and well-nourished. No distress.  Cardiovascular: Normal rate and regular rhythm.   Pulmonary/Chest:  No respiratory distress Reduced breath sounds and dullness to percussion to mid back on right  Abdominal:  Firm, protuberant belly Hepatomegaly No tenderness  Musculoskeletal:  Trace - 1+ bilateral pitting edema to mid shin  Neurological: He is alert and oriented to person, place, and time.  Psychiatric: He has a normal mood and affect. His behavior is normal.   Bedside Ultrasound Large R pleural effusion, simple, free flowing, underlying atelectatic lung Small L pleural effusion Hepatomegaly No ascites IVC without inspiratory collapse  CBC Latest Ref Rng & Units 08/28/2016 08/27/2016 12/14/2015  WBC 4.0 - 10.5 K/uL 6.5 6.9 8.2  Hemoglobin 13.0 - 17.0 g/dL 8.3(L) 6.6(LL) 10.3(L)  Hematocrit 39.0 - 52.0 % 27.6(L) 23.1(L) 31.7(L)  Platelets 150 - 400 K/uL 294 383 490(H)   Reticulocyte Index 0.2%     Component Value Date/Time   FERRITIN 7 (L) 08/28/2016 0047    CMP Latest Ref Rng & Units 08/28/2016 08/27/2016 12/19/2015  Glucose 65 - 99 mg/dL 88 93 68  BUN 6 - 20 mg/dL 16(X) 09(U) 11  Creatinine 0.61 - 1.24 mg/dL 0.45(W) 0.98(J) 1.91(Y)  Sodium 135 - 145 mmol/L 140 139 130(L)  Potassium 3.5 - 5.1 mmol/L 3.6 3.7 4.2  Chloride 101 - 111 mmol/L 107 106 100  CO2 22 - 32 mmol/L 20(L) 16(L) 20  Calcium 8.9 - 10.3 mg/dL 7.8(G)  6.6(L) 9.0  Total Protein 6.5 - 8.1 g/dL 6.9 7.3 -  Total Bilirubin 0.3 - 1.2 mg/dL 9.5(A) 1.2 -  Alkaline Phos 38 - 126 U/L 115 124 -  AST 15 - 41 U/L 51(H) 52(H) -  ALT 17 - 63 U/L 47 46 -   PT 17.7 aPTT 32  Cardiac Panel (last 3 results)  Recent Labs  08/28/16 0047 08/28/16 0617  TROPONINI 0.03* 0.03*    Assessment/Plan:  Active Problems:   Hepatic cirrhosis (HCC)   Heart failure with reduced ejection fraction (HCC)   Symptomatic anemia   Pleural effusion   71 y.o. male with CHF, CAD, and alcoholic cirrhosis who presents with dyspnea due to combination of of pleural effusion, decompensated heart failure, and symptomatic anemia.   His systolic function and right heart function have worsened from prior, concerning for progressive ischemic cardiomyopathy.  #HFrEF EF 20-25% with diffuse hypokinesis, down from 40-45% in 11/2015.  Additionally new tricuspid regurg and RA dilation.  Likely ischemic with known CAD, but also concern for new valvular disease leading to new right sided failure. -ischemic evaluation per cardiology -continue ramipril -hold carvedilol  #Pleural Effusion Right > left.  Likely cardiogenic in the absence of ascites.  He has risk factors for underlying pulmonary malignancy as a former smoker.  His effusion is large and contributing to his dyspnea. -thoracentesis, diagnostic and  therapeutic  #CAD High risk nuclear medicine stress yesterday due to systolic dysfunction.  On atorvastatin 20 mg daily.  PCI and BMS to LAD 11/2015.  Trops low and flat, no MI here. -increase to high intensity statin -cardiology following, possible cath -hold aspirin and plavix  #Symptomatic Anemia #History of Gastritis Hypoproliferative, microcytic, with very low ferritin indicative of iron deficiency.  Received 2U 11/2015. pRBCs with appropriate increase in Hgb.  He has history of GI bleeding but denies melena, hematochezia, or hematemesis, FOBT negative, no clinical evidence  of current bleeding.  EGD in Alcohol abuse and chronic disease may also be contributing, but iron deficiency, likely from chronic GI losses, is most likely. -Follow CBC -Transfuse to >7.0 -PO iron supplementation -f/u haptoglobin -outpatient GI follow-up  #Urinary Retention -Check PVR -Foley if continues to retain  #Alcohol Abuse -CIWA monitoring  Fluids: none Diet:  NPO DVT Prophylaxis: SCDs Code Status: full  Dispo: Anticipated discharge in approximately 1-2 day(s).   Alm Bustard, MD 08/28/2016, 9:38 AM Pager: [phone]

## 2016-08-29 DIAGNOSIS — I2583 Coronary atherosclerosis due to lipid rich plaque: Secondary | ICD-10-CM

## 2016-08-29 DIAGNOSIS — Z8719 Personal history of other diseases of the digestive system: Secondary | ICD-10-CM

## 2016-08-29 DIAGNOSIS — I472 Ventricular tachycardia: Secondary | ICD-10-CM

## 2016-08-29 DIAGNOSIS — E876 Hypokalemia: Secondary | ICD-10-CM

## 2016-08-29 DIAGNOSIS — I5023 Acute on chronic systolic (congestive) heart failure: Secondary | ICD-10-CM

## 2016-08-29 LAB — TYPE AND SCREEN
ABO/RH(D): O POS
ANTIBODY SCREEN: NEGATIVE
Unit division: 0
Unit division: 0

## 2016-08-29 LAB — BASIC METABOLIC PANEL
ANION GAP: 16 — AB (ref 5–15)
ANION GAP: 12 (ref 5–15)
Anion gap: 13 (ref 5–15)
BUN: 18 mg/dL (ref 6–20)
BUN: 17 mg/dL (ref 6–20)
BUN: 15 mg/dL (ref 6–20)
CALCIUM: 6.5 mg/dL — AB (ref 8.9–10.3)
CHLORIDE: 106 mmol/L (ref 101–111)
CHLORIDE: 105 mmol/L (ref 101–111)
CO2: 19 mmol/L — ABNORMAL LOW (ref 22–32)
CO2: 22 mmol/L (ref 22–32)
CO2: 22 mmol/L (ref 22–32)
CREATININE: 1.53 mg/dL — AB (ref 0.61–1.24)
CREATININE: 1.45 mg/dL — AB (ref 0.61–1.24)
CREATININE: 1.35 mg/dL — AB (ref 0.61–1.24)
Calcium: 6.4 mg/dL — CL (ref 8.9–10.3)
Calcium: 6.5 mg/dL — ABNORMAL LOW (ref 8.9–10.3)
Chloride: 106 mmol/L (ref 101–111)
GFR calc Af Amer: 51 mL/min — ABNORMAL LOW (ref >60)
GFR calc non Af Amer: 47 mL/min — ABNORMAL LOW (ref >60)
GFR calc non Af Amer: 51 mL/min — ABNORMAL LOW (ref >60)
GFR, EST AFRICAN AMERICAN: 54 mL/min — AB (ref >60)
GFR, EST AFRICAN AMERICAN: 59 mL/min — AB (ref >60)
GFR, EST NON AFRICAN AMERICAN: 44 mL/min — AB (ref >60)
GLUCOSE: 82 mg/dL (ref 65–99)
Glucose, Bld: 116 mg/dL — ABNORMAL HIGH (ref 65–99)
Glucose, Bld: 102 mg/dL — ABNORMAL HIGH (ref 65–99)
POTASSIUM: 3.1 mmol/L — AB (ref 3.5–5.1)
POTASSIUM: 3.8 mmol/L (ref 3.5–5.1)
Potassium: 3.3 mmol/L — ABNORMAL LOW (ref 3.5–5.1)
Sodium: 141 mmol/L (ref 135–145)
Sodium: 141 mmol/L (ref 135–145)
Sodium: 139 mmol/L (ref 135–145)

## 2016-08-29 LAB — BPAM RBC
Blood Product Expiration Date: 201805092359
Blood Product Expiration Date: 201805092359
ISSUE DATE / TIME: 201804110215
ISSUE DATE / TIME: 201804110459
UNIT TYPE AND RH: 5100
Unit Type and Rh: 5100

## 2016-08-29 LAB — CBC
HCT: 33.5 % — ABNORMAL LOW (ref 39.0–52.0)
HEMATOCRIT: 31 % — AB (ref 39.0–52.0)
HEMOGLOBIN: 9.5 g/dL — AB (ref 13.0–17.0)
Hemoglobin: 10.1 g/dL — ABNORMAL LOW (ref 13.0–17.0)
MCH: 20 pg — AB (ref 26.0–34.0)
MCH: 20.3 pg — AB (ref 26.0–34.0)
MCHC: 30.1 g/dL (ref 30.0–36.0)
MCHC: 30.6 g/dL (ref 30.0–36.0)
MCV: 66.5 fL — AB (ref 78.0–100.0)
MCV: 66.2 fL — AB (ref 78.0–100.0)
PLATELETS: 367 10*3/uL (ref 150–400)
PLATELETS: 339 10*3/uL (ref 150–400)
RBC: 5.04 MIL/uL (ref 4.22–5.81)
RBC: 4.68 MIL/uL (ref 4.22–5.81)
RDW: 25.5 % — AB (ref 11.5–15.5)
RDW: 25.6 % — ABNORMAL HIGH (ref 11.5–15.5)
WBC: 10.1 10*3/uL (ref 4.0–10.5)
WBC: 9.8 10*3/uL (ref 4.0–10.5)

## 2016-08-29 LAB — MAGNESIUM
Magnesium: 0.7 mg/dL — CL (ref 1.7–2.4)
Magnesium: 1.4 mg/dL — ABNORMAL LOW (ref 1.7–2.4)
Magnesium: 2.5 mg/dL — ABNORMAL HIGH (ref 1.7–2.4)

## 2016-08-29 LAB — PATHOLOGIST SMEAR REVIEW

## 2016-08-29 LAB — PHOSPHORUS: Phosphorus: 4.5 mg/dL (ref 2.5–4.6)

## 2016-08-29 LAB — HAPTOGLOBIN: HAPTOGLOBIN: 173 mg/dL (ref 34–200)

## 2016-08-29 MED ORDER — SODIUM CHLORIDE 0.9 % WEIGHT BASED INFUSION: 1.0000 mL/kg/h | INTRAVENOUS | Status: DC

## 2016-08-29 MED ORDER — POTASSIUM CHLORIDE 2 MEQ/ML IV SOLN
30.0000 meq | Freq: Once | INTRAVENOUS | Status: AC
  Administered 2016-08-29: 30 meq via INTRAVENOUS
  Filled 2016-08-29: qty 15

## 2016-08-29 MED ORDER — SODIUM CHLORIDE 0.9 % IV SOLN
30.0000 meq | Freq: Once | INTRAVENOUS | Status: AC
  Administered 2016-08-29: 30 meq via INTRAVENOUS
  Filled 2016-08-29: qty 15

## 2016-08-29 MED ORDER — MAGNESIUM SULFATE 4 GM/100ML IV SOLN
4.0000 g | Freq: Once | INTRAVENOUS | Status: AC
  Administered 2016-08-29: 4 g via INTRAVENOUS
  Filled 2016-08-29: qty 100

## 2016-08-29 MED ORDER — POTASSIUM CHLORIDE CRYS ER 20 MEQ PO TBCR
20.0000 meq | EXTENDED_RELEASE_TABLET | Freq: Once | ORAL | Status: AC
Start: 2016-08-29 — End: 2016-08-29
  Administered 2016-08-29: 20 meq via ORAL
  Filled 2016-08-29: qty 1

## 2016-08-29 MED ORDER — SODIUM CHLORIDE 0.9 % IV SOLN
2.0000 g | Freq: Once | INTRAVENOUS | Status: AC
  Administered 2016-08-29: 2 g via INTRAVENOUS
  Filled 2016-08-29: qty 20

## 2016-08-29 MED ORDER — SODIUM CHLORIDE 0.9 % WEIGHT BASED INFUSION
3.0000 mL/kg/h | INTRAVENOUS | Status: DC
  Administered 2016-08-30: 3 mL/kg/h via INTRAVENOUS

## 2016-08-29 MED ORDER — METOPROLOL TARTRATE 12.5 MG HALF TABLET: 12.5000 mg | ORAL_TABLET | Freq: Two times a day (BID) | ORAL | Status: DC

## 2016-08-29 MED ORDER — ASPIRIN 81 MG PO CHEW
81.0000 mg | CHEWABLE_TABLET | ORAL | Status: AC
  Administered 2016-08-30: 81 mg via ORAL
  Filled 2016-08-29: qty 1

## 2016-08-29 MED ORDER — POTASSIUM CHLORIDE CRYS ER 20 MEQ PO TBCR
40.0000 meq | EXTENDED_RELEASE_TABLET | Freq: Every day | ORAL | Status: AC
  Administered 2016-08-29: 40 meq via ORAL
  Filled 2016-08-29: qty 2

## 2016-08-29 MED ORDER — SODIUM CHLORIDE 0.9% FLUSH
3.0000 mL | Freq: Two times a day (BID) | INTRAVENOUS | Status: DC
  Administered 2016-08-29: 3 mL via INTRAVENOUS

## 2016-08-29 MED ORDER — POTASSIUM CHLORIDE CRYS ER 20 MEQ PO TBCR: 20.0000 meq | EXTENDED_RELEASE_TABLET | Freq: Every day | ORAL | Status: DC

## 2016-08-29 MED ORDER — SODIUM CHLORIDE 0.9% FLUSH: 3.0000 mL | INTRAVENOUS | Status: DC | PRN

## 2016-08-29 MED ORDER — CARVEDILOL 3.125 MG PO TABS
3.1250 mg | ORAL_TABLET | Freq: Two times a day (BID) | ORAL | Status: DC
  Administered 2016-08-29 – 2016-08-31 (×4): 3.125 mg via ORAL
  Filled 2016-08-29 (×4): qty 1

## 2016-08-29 MED ORDER — SODIUM CHLORIDE 0.9 % IV SOLN: 250.0000 mL | INTRAVENOUS | Status: DC | PRN

## 2016-08-29 NOTE — Progress Notes (Signed)
Paged that patient had few minutes of vtach while up urinating but asymptomatic. Spontaneously self resolved. Evaluated patient at bedside and RN present. He is sleeping but awakens to voice. He states that his dyspnea is much better. Denies chest pain or palpitations. Cardiac auscultation with tachycardic but regular rhythm in low 100s. Review of telemetry shows wide complex monomorphic tachycardia to 190s at around 4:50am. Now sinus tachycardia with rate at 109. Gave additional potassium in addition to scheduled daily for K 3.3 this morning. Add on lab measurement of magnesium and phosphorus.  Griffin Basil, MD  Internal Medicine PGY-3 Pager: [phone]

## 2016-08-29 NOTE — Progress Notes (Addendum)
Internal Medicine Attending:   I saw and examined the patient. I reviewed the resident's note and I agree with the resident's findings and plan as documented in the resident's note.  71 year old man hospital day #3 with acute on chronic heart failure with reduced ejection fraction and volume overload most consistent with right heart failure. He did well with the right-sided thoracentesis yesterday. We removed the maximum 1400 mL, the remains still a moderate right-sided effusion but x-ray shows better aeration to the right lower and middle lobes. Diuresis with IV Lasix is going well, his weight is down 9 pounds from admission. Renal function is improving. Electrolytes are being repleted today. I think he needs at least 1-2 more days of IV diuresis. He is doing much better lying flat today. Can likely complete left and right heart catheterizations tomorrow depending on the cardiology schedule.  There was a 30 second run of asymptomatic V tach last night that resolved spontaneously. Mag was very low at 0.7 which is being repleated today. On exam he is now sinus tachycardia with frequent PVCs. We are going to start back a low dose of home metoprolol which should be okay now that his volume status is improving.   His acute on chronic iron deficiency anemia is also improving. Hemoglobin rose from 8.3g to 10g today, no blood transfusions yesterday. This likely represents hemoconcentration in the setting of diuresis. No signs of active GI bleeding, so I think we can defer endoscopies for now.

## 2016-08-29 NOTE — Care Management Note (Signed)
Case Management Note Donn Pierini RN, BSN Unit 2W-Case Manager [phone]  Patient Details  Name: Derrick Ramos MRN: [id number] Date of Birth: 05-31-1945  Subjective/Objective:  Pt admitted with symptomatic anemia, HF                  Action/Plan: PTA pt lived at home- independent- anticipate return home- CM will follow for d/c needs  Expected Discharge Date:                  Expected Discharge Plan:  Home/Self Care  In-House Referral:     Discharge planning Services  CM Consult  Post Acute Care Choice:  NA Choice offered to:  NA  DME Arranged:    DME Agency:     HH Arranged:    HH Agency:     Status of Service:  In process, will continue to follow  If discussed at Long Length of Stay Meetings, dates discussed:    Additional Comments:  Darrold Span, RN 08/29/2016, 3:21 PM

## 2016-08-29 NOTE — Progress Notes (Signed)
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 Clifton James  11/29/15. He has a history of hypertension, tobacco abuse, GERD and PUD. His EF has been moderately reduced the past because of progressive dyspnea he had a Myoview stress test was nonischemic but had severe LV dysfunction and a 2-D echo showed an EF of 20% which was new. Patient has new symptoms of heart failure and was admitted for diuresis and further evaluation.    Inpatient Medications    Scheduled Meds: . atorvastatin  20 mg Oral q1800  . folic acid  1 mg Oral Daily  . furosemide  40 mg Intravenous BID  . iron polysaccharides  150 mg Oral Daily  . loratadine  10 mg Oral Daily  . metoprolol tartrate  12.5 mg Oral BID  . multivitamin with minerals  1 tablet Oral Daily  . pantoprazole  40 mg Oral BID  . [START ON 08/30/2016] potassium chloride  20 mEq Oral Daily  . ramipril  10 mg Oral Daily  . sodium chloride flush  3 mL Intravenous Q12H  . thiamine  100 mg Oral Daily   Or  . thiamine  100 mg Intravenous Daily   Continuous Infusions:  PRN Meds: sodium chloride, acetaminophen, guaiFENesin, nitroGLYCERIN, ondansetron (ZOFRAN) IV, sodium chloride flush   Vital Signs    Vitals:   08/28/16 1334 08/28/16 2043 08/29/16 0258 08/29/16 0510  BP: (!) 158/100 (!) 136/91  (!) 153/91  Pulse: 92 (!) 104  (!) 103  Resp: 18 18  18   Temp: 97.9 F (36.6 C) 98.8 F (37.1 C)  98.3 F (36.8 C)  TempSrc: Oral Oral  Oral  SpO2: 96% 94%  97%  Weight:   133 lb 1.6 oz (60.4 kg)   Height:        Intake/Output Summary (Last 24 hours) at 08/29/16 1052 Last data filed at 08/29/16 0800  Gross per 24 hour  Intake              360 ml  Output             1450 ml  Net            -1090 ml   Filed  Weights   08/27/16 1654 08/27/16 2326 08/29/16 0258  Weight: 144 lb (65.3 kg) 140 lb (63.5 kg) 133 lb 1.6 oz (60.4 kg)    Telemetry   Normal sinus rhythm with PVCs, couplets and a run of nonsustained ventricular tachycardia - Personally Reviewed  ECG    Not performed today - Personally Reviewed  Physical Exam   GEN: No acute distress.   Neck: Elevated jugular venous pressure Cardiac: RRR, no murmurs, rubs, S3 summation gallop  Respiratory: Lung sounds are better today with better aeration of the right lower lobe  GI: Soft, nontender, non-distended  MS: No edema; No deformity. Neuro:  Nonfocal  Psych: Normal affect  Extremities:  trace bilateral lower extremity edema improved from yesterday    Labs    Chemistry  Recent Labs Lab 08/27/16 1805 08/28/16 0047 08/29/16 0214  NA 139 140 141  K 3.7 3.6 3.3*  CL 106 107 106  CO2 16* 20* 19*  GLUCOSE 93 88 82  BUN 21* 21* 18  CREATININE 1.84* 1.76* 1.53*  CALCIUM 6.6* 6.5* 6.5*  PROT 7.3  6.9  --   ALBUMIN 3.4* 3.2*  --   AST 52* 51*  --   ALT 46 47  --   ALKPHOS 124 115  --   BILITOT 1.2 1.6*  --   GFRNONAA 35* 37* 44*  GFRAA 41* 43* 51*  ANIONGAP 17* 13 16*     Hematology  Recent Labs Lab 08/27/16 1805 08/28/16 0047 08/28/16 0617 08/29/16 0214  WBC 6.9  --  6.5 10.1  RBC 3.76* 3.73* 4.20* 5.04  HGB 6.6*  --  8.3* 10.1*  HCT 23.1*  --  27.6* 33.5*  MCV 61.4*  --  65.7* 66.5*  MCH 17.6*  --  19.8* 20.0*  MCHC 28.6*  --  30.1 30.1  RDW 21.1*  --  26.1* 25.5*  PLT 383  --  294 367    Cardiac Enzymes  Recent Labs Lab 08/28/16 0047 08/28/16 0617  TROPONINI 0.03* 0.03*     Recent Labs Lab 08/27/16 1917  TROPIPOC 0.00     BNP  Recent Labs Lab 08/27/16 1750  BNP 2,433.0*     DDimer No results for input(s): DDIMER in the last 168 hours.   Radiology    Dg Chest 2 View  Result Date: 08/28/2016 CLINICAL DATA:  Status post thoracentesis. Three weeks shortness of breath with onset of cough  today. Former smoker. EXAM: CHEST  2 VIEW COMPARISON:  Chest x-ray of August 27, 2016 FINDINGS: There remains a moderate-sized right pleural effusion. Parenchymal density in the right middle and lower lobe is less prominent today. There is a small left pleural effusion. No pneumothorax or pneumomediastinum is observed. The cardiac silhouette is enlarged. There is tortuosity of the ascending and descending thoracic aorta. There is calcification in the wall of the aortic arch. The mediastinum is normal in width. There are 6 through eighth left rib fractures are unchanged in appearance sh since earlier today but have appeared since the December 01, 2015 study. IMPRESSION: Decreased volume of pleural fluid on the right with some improved aeration of the right middle and lower lobes but persistent interstitial density in the right lower lung is present. No pneumothorax. Stable small left pleural effusion. Thoracic aortic atherosclerosis. Electronically Signed   By: David  [place] M.D.   On: 08/28/2016 16:12   Dg Chest 2 View  Result Date: 08/27/2016 CLINICAL DATA:  Shortness of breath for 1 week, cough EXAM: CHEST  2 VIEW COMPARISON:  12/01/2015 FINDINGS: There is moderate-sized right pleural effusion. There is atelectasis, consolidation or infiltrate in right lower lobe. No pulmonary edema. Ortho fracture of the left sixth, seventh and eighth ribs. IMPRESSION: Moderate-sized right pleural effusion. Atelectasis, infiltrate or consolidation in right lower lobe. No pulmonary edema. Electronically Signed   By: Natasha Mead M.D.   On: 08/27/2016 17:17    Cardiac Studies   2D Echo-   Study Conclusions  - Left ventricle: The cavity size was normal. Wall thickness was   increased in a pattern of moderate LVH. Systolic function was   severely reduced. The estimated ejection fraction was in the   range of 20% to 25%. Diffuse hypokinesis. The study is not   technically sufficient to allow evaluation of LV diastolic    function. - Aortic valve: Moderately calcified. No stenosis. Mild   regurgitation. - Aorta: Mildly dilated ascending aorta. Aortic root dimension: 39   mm (ED). Ascending aortic diameter: 41 mm (S). - Mitral valve: There was mild regurgitation. - Left atrium: Moderately dilated. - Right ventricle: The  cavity size was mildly dilated. - Right atrium: Severely dilated. - Tricuspid valve: There was moderate to severe eccentric   regurgitation, directed at the interatrial septum. - Pulmonary arteries: PA peak pressure: 50 mm Hg (S). - Pericardium, extracardiac: A trivial pericardial effusion was   identified posterior to the heart. Features were not consistent   with tamponade physiology. There was a right pleural effusion.   There was a left pleural effusion.  Impressions:  - LVEF 20-25%, moderate LVH, global hypokinesis, mild AI (probably   mild aortic stenosis), dilated aorta to 4.1 cm, mild MR, moderate   LAE, mild RVE, severe RAE, moderate to severe eccentric TR, RVSP   50 mmHg, bilateral pleural effusions, trivial pericardial   effusion without tamponade features.   Patient Profile     71 y.o. male with a history of alcohol abuse, cirrhosis, mixed ischemic and nonischemic cardiomyopathy status post bare metal stenting of his proximal LAD July 2017. He's had progressive dyspnea with recent Myoview that showed no ischemia but severe LV dysfunction. 2-D echo revealed an EF of 20-25%  with severe TR and RV dilatation. He was admitted because of this and symptoms of heart failure. He had elevated jugular venous pressure and peripheral edema. Chest x-ray did show a large right pleural effusion and blood work showed a hemoglobin of 6.6. He was transfused 2 units of packed red blood cells with a resulting hemoglobin of 8.3. She is getting Lasix IV twice a day with mild diuresis. Thoracentesis is planned. At some point once he is euvolemic we will plan right and left heart cath to further  define his anatomy and physiology.  Assessment & Plan    1: Ischemic cardiomyopathy-history of mixed ischemic and nonischemic cardiomyopathy probably related to ethanol abuse. His EF has declined to 20-25% range with a recent nonischemic Myoview. He has signs and symptoms of volume overload.He has been diuresis with twice a day diuretics. His eyesare -1.3 L. His lower extremity edema has improved as has his jugular venous pressure. I believe he is now ready to undergo right and left heart cath tomorrow. He was put on metoprolol 12 mg by mouth twice a day. I suspect carvedilol may be a better drug for him and I suggest starting at 3.125 mg by mouth twice a day and titrating    2: Iron deficiency anemia-probably multifactorial from a GI source however worry about other etiologies such as varices with history of cirrhosis. He has had endoscopy in the past. He's had transfusion 2 units packed red blood cells with a resulting increase in his hemoglobin from 6.6 up to  to 10.1 . Agree with initiation of H2 blockers.  3: Dyspnea on exertion-probably multifactorial from systolic heart failure and volume overload as well as a large right pleural effusion.He had thoracentesis on the right side yesterday removing 1.4 L of fluid. He feels symptomatically improved. Postprocedure chest x-ray showed improved aeration on the right with residual interstitial edema.  4: Nonsustained ventricular tachycardia-patient had a 20 beat run of nonsustained ventricular tachycardia. His potassium and magnesium were low which are being repleted appropriately. He is on low-dose metoprolol. I suggest switching him to carvedilol.  We will follow with you  Signed, [name], MD  08/29/2016, 10:52 AM

## 2016-08-29 NOTE — Progress Notes (Signed)
CRITICAL VALUE ALERT  Critical value received:  Calcium 6.4  Date of notification:  August 29 2016  Time of notification:  12:51  Critical value read back:Yes.    Nurse who received alert:  Lawrence Santiago, RN  MD notified (1st page):  Peggyann Juba  Time of first page:  12:53  MD notified (2nd page):  Time of second page:  Responding MD:  Peggyann Juba  Time MD responded:  12:54

## 2016-08-29 NOTE — Progress Notes (Signed)
Subjective: Had an episode of asymptomatic VT in the early AM.  He thinks he must have been asleep at the time, did not have any symptoms overnight.  He and denies CP, worsening dyspnea, palpitations, dyspnea, or other symptoms this morning.  Breathing is feeling better, able to lay flat.  Objective:  Vital signs in last 24 hours: Vitals:   08/28/16 1334 08/28/16 2043 08/29/16 0258 08/29/16 0510  BP: (!) 158/100 (!) 136/91  (!) 153/91  Pulse: 92 (!) 104  (!) 103  Resp: Temp: 97.9 F (36.6 C) 98.8 F (37.1 C)  98.3 F (36.8 C)  TempSrc: Oral Oral  Oral  SpO2: 96% 94%  97%  Weight:   133 lb 1.6 oz (60.4 kg)   Height:        Intake/Output Summary (Last 24 hours) at 08/29/16 1043 Last data filed at 08/29/16 0800  Gross per 24 hour  Intake              360 ml  Output             1450 ml  Net            -1090 ml   Filed Weights   08/27/16 1654 08/27/16 2326 08/29/16 0258  Weight: 144 lb (65.3 kg) 140 lb (63.5 kg) 133 lb 1.6 oz (60.4 kg)   Telemetry: 30s monomorphic VT, several episodes of NSVT  Physical Exam  Constitutional: He is oriented to person, place, and time. He appears well-developed and well-nourished. No distress.  Cardiovascular: Normal rate and regular rhythm.   Pulmonary/Chest:  No respiratory distress Reduced breath sounds, crackles, and dullness to percussion right posterior lower lung field No bleeding or drainage from R thoracentesis site  Abdominal:  Firm, protuberant belly Hepatomegaly No tenderness  Musculoskeletal:  Trace bilateral pitting edema to mid shin  Neurological: He is alert and oriented to person, place, and time.  Psychiatric: He has a normal mood and affect. His behavior is normal.    CBC Latest Ref Rng & Units 08/29/2016 08/28/2016 08/27/2016  WBC 4.0 - 10.5 K/uL 10.1 6.5 6.9  Hemoglobin 13.0 - 17.0 g/dL 10.1(L) 8.3(L) 6.6(LL)  Hematocrit 39.0 - 52.0 % 33.5(L) 27.6(L) 23.1(L)  Platelets 150 - 400 K/uL 367 294 383    CMP  Latest Ref Rng & Units 08/29/2016 08/28/2016 08/27/2016  Glucose 65 - 99 mg/dL 82 88 93  BUN 6 - 20 mg/dL 18 16(X) 09(U)  Creatinine 0.61 - 1.24 mg/dL 0.45(W) 0.98(J) 1.91(Y)  Sodium 135 - 145 mmol/L 141 140 139  Potassium 3.5 - 5.1 mmol/L 3.3(L) 3.6 3.7  Chloride 101 - 111 mmol/L 106 107 106  CO2 22 - 32 mmol/L 19(L) 20(L) 16(L)  Calcium 8.9 - 10.3 mg/dL 7.8(G) 6.5(L) 6.6(L)  Total Protein 6.5 - 8.1 g/dL - 6.9 7.3  Total Bilirubin 0.3 - 1.2 mg/dL - 1.6(H) 1.2  Alkaline Phos 38 - 126 U/L - 115 124  AST 15 - 41 U/L - 51(H) 52(H)  ALT 17 - 63 U/L - 47 46   Assessment/Plan:  Principal Problem:   Heart failure with reduced ejection fraction (HCC) Active Problems:   Hepatic cirrhosis (HCC)   Symptomatic anemia   Pleural effusion   Acute on chronic diastolic congestive heart failure (HCC)   71 y.o. male with CHF, CAD, and alcoholic cirrhosis who presents with dyspnea due to combination of of pleural effusion, decompensated heart failure, and symptomatic anemia.   His systolic function  and right heart function have worsened from prior, concerning for progressive ischemic cardiomyopathy.  He is diuresing well and awaiting cardiac catheterization.  #HFrEF #Volume Overload Volume overload and pulmonary edema improving with diuresis, now better able to tolerate lying flat.  EF 20-25% with diffuse hypokinesis, down from 40-45% in 11/2015.  Additionally new tricuspid regurg and RA dilation.  Likely ischemic with known CAD, but also concern for new valvular disease leading to new right sided failure.  Dry weight ~128 lbs.  30s run of VT last night concerning with his structural heart disease. -appreciate cardiology recommendations -continue ramipril -resume home metoprolol at reduced dose 12.5 mg BID  #CAD High risk nuclear medicine stress yesterday due to systolic dysfunction.  On atorvastatin 20 mg daily.  PCI and BMS to LAD 11/2015.  Trops low and flat, no MI here. -plan to increase to high  intensity statin therapy -cardiology following for LHC/RHC -hold aspirin and plavix  #Hypokalemia #Hypomagnesemia With loop diuresis. -Replete Mg and K -Follow electrolytes  #Pleural Effusion Improved.  Right > left effusion, thoracentesis removed 1400 mL transudative fluid on 4/11 from R with symptomatic improvement.  Likely cardiogenic in the absence of ascites, anticipate continued gradual improvement with diuresis.  #Symptomatic Anemia #History of Gastritis Hypoproliferative, microcytic, with very low ferritin indicative of iron deficiency.  Received 2U pRBCs on 4/10 with appropriate increase in Hgb, stable/improving with diuresis since.  He has history of GI bleeding but denies melena, hematochezia, or hematemesis, FOBT negative, no clinical evidence of current bleeding.  EGD in Alcohol abuse and chronic disease may also be contributing, but iron deficiency, likely from chronic GI losses, is most likely. -Follow CBC -Transfuse to >7.0 -PO iron supplementation -outpatient GI follow-up  #Urinary Retention Resolved.  Low PVR after admission, urinating well.  #Alcohol Abuse No signs of withdrawal. -CIWA monitoring  Fluids: none Diet:  heart/carb DVT Prophylaxis: lovenox Code Status: full  Dispo: Anticipated discharge in approximately 1-2 day(s).   Alm Bustard, MD 08/29/2016, 10:43 AM Pager: [phone]

## 2016-08-30 ENCOUNTER — Encounter (HOSPITAL_COMMUNITY): Payer: Self-pay | Admitting: Cardiovascular Disease

## 2016-08-30 ENCOUNTER — Encounter (HOSPITAL_COMMUNITY)
Admission: EM | Disposition: A | Discharge status: Home / Self Care | Payer: Self-pay | Source: Home / Self Care | Attending: Student in an Organized Health Care Education/Training Program

## 2016-08-30 DIAGNOSIS — I2510 Atherosclerotic heart disease of native coronary artery without angina pectoris: Secondary | ICD-10-CM

## 2016-08-30 HISTORY — PX: RIGHT/LEFT HEART CATH AND CORONARY ANGIOGRAPHY: CATH118266

## 2016-08-30 LAB — COMPREHENSIVE METABOLIC PANEL
ALK PHOS: 107 U/L (ref 38–126)
ALT: 41 U/L (ref 17–63)
AST: 48 U/L — ABNORMAL HIGH (ref 15–41)
Albumin: 2.8 g/dL — ABNORMAL LOW (ref 3.5–5.0)
Anion gap: 10 (ref 5–15)
BUN: 13 mg/dL (ref 6–20)
CALCIUM: 7.5 mg/dL — AB (ref 8.9–10.3)
CHLORIDE: 104 mmol/L (ref 101–111)
CO2: 24 mmol/L (ref 22–32)
CREATININE: 1.35 mg/dL — AB (ref 0.61–1.24)
GFR, EST AFRICAN AMERICAN: 59 mL/min — AB (ref >60)
GFR, EST NON AFRICAN AMERICAN: 51 mL/min — AB (ref >60)
Glucose, Bld: 94 mg/dL (ref 65–99)
Potassium: 3.8 mmol/L (ref 3.5–5.1)
Sodium: 138 mmol/L (ref 135–145)
Total Bilirubin: 1.7 mg/dL — ABNORMAL HIGH (ref 0.3–1.2)
Total Protein: 6.5 g/dL (ref 6.5–8.1)

## 2016-08-30 LAB — BASIC METABOLIC PANEL
ANION GAP: 8 (ref 5–15)
BUN: 10 mg/dL (ref 6–20)
CALCIUM: 7.8 mg/dL — AB (ref 8.9–10.3)
CO2: 24 mmol/L (ref 22–32)
CREATININE: 1.21 mg/dL (ref 0.61–1.24)
Chloride: 105 mmol/L (ref 101–111)
GFR, EST NON AFRICAN AMERICAN: 58 mL/min — AB (ref >60)
Glucose, Bld: 99 mg/dL (ref 65–99)
Potassium: 3.6 mmol/L (ref 3.5–5.1)
Sodium: 137 mmol/L (ref 135–145)

## 2016-08-30 LAB — POCT I-STAT 3, VENOUS BLOOD GAS (G3P V)
Acid-base deficit: 1 mmol/L (ref 0.0–2.0)
Bicarbonate: 23.4 mmol/L (ref 20.0–28.0)
O2 SAT: 53 %
PCO2 VEN: 35.4 mmHg — AB (ref 44.0–60.0)
PO2 VEN: 27 mmHg — AB (ref 32.0–45.0)
TCO2: 24 mmol/L (ref 0–100)
pH, Ven: 7.428 (ref 7.250–7.430)

## 2016-08-30 LAB — POCT I-STAT 3, ART BLOOD GAS (G3+)
Acid-base deficit: 2 mmol/L (ref 0.0–2.0)
BICARBONATE: 21.4 mmol/L (ref 20.0–28.0)
O2 Saturation: 92 %
PCO2 ART: 30.9 mmHg — AB (ref 32.0–48.0)
PH ART: 7.447 (ref 7.350–7.450)
TCO2: 22 mmol/L (ref 0–100)
pO2, Arterial: 59 mmHg — ABNORMAL LOW (ref 83.0–108.0)

## 2016-08-30 LAB — CBC
HCT: 32.2 % — ABNORMAL LOW (ref 39.0–52.0)
HEMATOCRIT: 31.8 % — AB (ref 39.0–52.0)
HEMOGLOBIN: 9.7 g/dL — AB (ref 13.0–17.0)
HEMOGLOBIN: 9.7 g/dL — AB (ref 13.0–17.0)
MCH: 20.2 pg — AB (ref 26.0–34.0)
MCH: 20.2 pg — ABNORMAL LOW (ref 26.0–34.0)
MCHC: 30.5 g/dL (ref 30.0–36.0)
MCHC: 30.1 g/dL (ref 30.0–36.0)
MCV: 66.1 fL — AB (ref 78.0–100.0)
MCV: 67.1 fL — ABNORMAL LOW (ref 78.0–100.0)
PLATELETS: 305 10*3/uL (ref 150–400)
Platelets: 322 10*3/uL (ref 150–400)
RBC: 4.81 MIL/uL (ref 4.22–5.81)
RBC: 4.8 MIL/uL (ref 4.22–5.81)
RDW: 26.3 % — ABNORMAL HIGH (ref 11.5–15.5)
RDW: 25.9 % — ABNORMAL HIGH (ref 11.5–15.5)
WBC: 9.5 10*3/uL (ref 4.0–10.5)
WBC: 9 10*3/uL (ref 4.0–10.5)

## 2016-08-30 LAB — MAGNESIUM: Magnesium: 2 mg/dL (ref 1.7–2.4)

## 2016-08-30 LAB — CALCIUM, IONIZED: Calcium, Ionized, Serum: 3.7 mg/dL — ABNORMAL LOW (ref 4.5–5.6)

## 2016-08-30 SURGERY — RIGHT/LEFT HEART CATH AND CORONARY ANGIOGRAPHY
Anesthesia: LOCAL

## 2016-08-30 MED ORDER — IOPAMIDOL (ISOVUE-370) INJECTION 76%
INTRAVENOUS | Status: DC | PRN
  Administered 2016-08-30: 65 mL via INTRA_ARTERIAL

## 2016-08-30 MED ORDER — ACETAMINOPHEN 325 MG PO TABS: 650.0000 mg | ORAL_TABLET | ORAL | Status: DC | PRN

## 2016-08-30 MED ORDER — SODIUM CHLORIDE 0.9% FLUSH
3.0000 mL | Freq: Two times a day (BID) | INTRAVENOUS | Status: DC
  Administered 2016-08-31: 3 mL via INTRAVENOUS

## 2016-08-30 MED ORDER — SODIUM CHLORIDE 0.9% FLUSH: 3.0000 mL | INTRAVENOUS | Status: DC | PRN

## 2016-08-30 MED ORDER — SODIUM CHLORIDE 0.9% FLUSH: 3.0000 mL | Freq: Two times a day (BID) | INTRAVENOUS | Status: DC

## 2016-08-30 MED ORDER — ONDANSETRON HCL 4 MG/2ML IJ SOLN: 4.0000 mg | Freq: Four times a day (QID) | INTRAMUSCULAR | Status: DC | PRN

## 2016-08-30 MED ORDER — SODIUM CHLORIDE 0.9 % WEIGHT BASED INFUSION
1.0000 mL/kg/h | INTRAVENOUS | Status: DC
Start: 2016-08-31 — End: 2016-08-30

## 2016-08-30 MED ORDER — SODIUM CHLORIDE 0.9 % WEIGHT BASED INFUSION: 3.0000 mL/kg/h | INTRAVENOUS | Status: DC

## 2016-08-30 MED ORDER — LIDOCAINE HCL (PF) 1 % IJ SOLN
INTRAMUSCULAR | Status: DC | PRN
  Administered 2016-08-30: 15 mL

## 2016-08-30 MED ORDER — MORPHINE SULFATE (PF) 2 MG/ML IV SOLN: 2.0000 mg | INTRAVENOUS | Status: DC | PRN

## 2016-08-30 MED ORDER — ASPIRIN 81 MG PO CHEW
CHEWABLE_TABLET | ORAL | Status: AC
  Filled 2016-08-30: qty 1

## 2016-08-30 MED ORDER — HYDRALAZINE HCL 20 MG/ML IJ SOLN: 10.0000 mg | INTRAMUSCULAR | Status: DC | PRN

## 2016-08-30 MED ORDER — ASPIRIN 81 MG PO CHEW: 81.0000 mg | CHEWABLE_TABLET | ORAL | Status: DC

## 2016-08-30 MED ORDER — LIDOCAINE HCL (PF) 1 % IJ SOLN
INTRAMUSCULAR | Status: AC
  Filled 2016-08-30: qty 30

## 2016-08-30 MED ORDER — SODIUM CHLORIDE 0.9 % IV SOLN: 250.0000 mL | INTRAVENOUS | Status: DC | PRN

## 2016-08-30 MED ORDER — IOPAMIDOL (ISOVUE-370) INJECTION 76%
INTRAVENOUS | Status: AC
  Filled 2016-08-30: qty 100

## 2016-08-30 MED ORDER — SODIUM CHLORIDE 0.9 % IV SOLN
1.0000 g | Freq: Once | INTRAVENOUS | Status: AC
  Administered 2016-08-30: 1 g via INTRAVENOUS
  Filled 2016-08-30: qty 10

## 2016-08-30 MED ORDER — HEPARIN (PORCINE) IN NACL 2-0.9 UNIT/ML-% IJ SOLN
INTRAMUSCULAR | Status: DC | PRN
  Administered 2016-08-30: 1000 mL

## 2016-08-30 MED ORDER — HEPARIN (PORCINE) IN NACL 2-0.9 UNIT/ML-% IJ SOLN
INTRAMUSCULAR | Status: AC
  Filled 2016-08-30: qty 1000

## 2016-08-30 MED ORDER — SODIUM CHLORIDE 0.9 % IV SOLN: INTRAVENOUS | Status: AC

## 2016-08-30 MED ORDER — POTASSIUM CHLORIDE CRYS ER 20 MEQ PO TBCR
40.0000 meq | EXTENDED_RELEASE_TABLET | Freq: Two times a day (BID) | ORAL | Status: AC
  Administered 2016-08-30 (×2): 40 meq via ORAL
  Filled 2016-08-30 (×2): qty 2

## 2016-08-30 SURGICAL SUPPLY — 10 items
CATH INFINITI 5FR MULTPACK ANG (CATHETERS) ×2 IMPLANT
CATH SWAN GANZ 7F STRAIGHT (CATHETERS) ×2 IMPLANT
DEVICE CLOSURE MYNXGRIP 5F (Vascular Products) ×2 IMPLANT
KIT HEART LEFT (KITS) ×2 IMPLANT
PACK CARDIAC CATHETERIZATION (CUSTOM PROCEDURE TRAY) ×2 IMPLANT
SHEATH PINNACLE 5F 10CM (SHEATH) ×2 IMPLANT
SHEATH PINNACLE 7F 10CM (SHEATH) ×2 IMPLANT
SYR MEDRAD MARK V 150ML (SYRINGE) ×2 IMPLANT
TRANSDUCER W/STOPCOCK (MISCELLANEOUS) ×2 IMPLANT
WIRE EMERALD 3MM-J .035X150CM (WIRE) ×2 IMPLANT

## 2016-08-30 NOTE — Progress Notes (Addendum)
   Subjective: Patient seen and examined this morning. He denies any complaints including chest pain, palpitations, lightheadedness. Shortness of breath continues to improve. No acute events overnight. Last run of NSVT was 4/12 at 1230. Overnight, tele shows frequent PVCs.   Objective: Vital signs in last 24 hours: Vitals:   08/30/16 1117 08/30/16 1121 08/30/16 1126 08/30/16 1149  BP: (!) 149/100 (!) 141/99 (!) 144/99 137/90  Pulse: 98 86 94 100  Resp: 14 16 16 16   Temp:      TempSrc:      SpO2: 100% 100% 100% 97%  Weight:      Height:       Physical Exam General: Vital signs reviewed.  Patient is chronically ill appearing, in no acute distress and cooperative with exam.  Cardiovascular: Tachycardic, regular rate, no murmurs, gallops, or rubs. Pulmonary/Chest: Mild fine inspiratory rales in R mid lung field, clear elsewhere, no wheezes. Abdominal: Soft, non-tender, mildly distended, BS +  Extremities: No lower extremity edema bilaterally, pulses symmetric and intact bilaterally. Neurological: Awake, alert, moves all extremities Skin: Warm, dry and intact. No rashes or erythema.  Assessment/Plan: Principal Problem:   Heart failure with reduced ejection fraction (HCC) Active Problems:   Hepatic cirrhosis (HCC)   Symptomatic anemia   Pleural effusion   Acute on chronic diastolic congestive heart failure (HCC)   CAD in native artery  Mr. Braxton is a 60 male with CHF, CAD, and alcoholic cirrhosis who presents with dyspnea due to combination of of pleural effusion, decompensated heart failure, and symptomatic anemia.     Acute on Chronic HFrEF Exacerbation: Patient presented with volume overload and has been diuresing well. He remains on lasix 40 mg IV BID with -1400 mL of urine out yesterday. Net -585 yesterday, net -1,655 since admission. Creatinine stable. Weight mildly increased this morning from 133 to 134. Shortness of breath improving. Laying flat comfortably. Given worsened EF  to 20-25% from July 2017 and diffuse hypokinesis, there is concern for ischemic medication CM versus new valvular disease leading to right sided heart failure. TR seen on echo. No recurrence of NSVT. Results of L and RHC showed widely patent ostial LAD stent. HF is likely 2/2 to worsening of nonischemic cardiomyopathy. Evidence of pulmonary hypertension but LVEDP is only mildly elevated at 20. Recommendations are for aggressive optimal medical therapy for systolic heart failure.  -Continue Lasix 40 mg IV BID -Replacing electrolytes -On carvedilol 3.125 mg BID -On Ramipril 10 mg QD -Cardiology following  Hypomagnesemia/Hypokalemia/Hypocalcemia: Improved. Replacing. -Continue to monitor and replace as necessary to keep K>4, Mag >2  CAD: High risk stress test. Micah Flesher for [facility] today, results above. PCI and BMS placed to LAD in July 2017, patent.  -Atorvastatin 20 mg QD, plans to increase to high intensity. -ASA  -Holding Plavix, restart when able -On carvedilol 3.125 mg BID -On Ramipril 10 mg QD  Alcohol Abuse: -Continue CIWA  DVT/PE ppx: SCDs, restart anticoagulation tomorrow for ppx CODE: FULL FEN: CM  Dispo: Anticipated discharge in approximately 2 day(s).   LOS: 3 days   Karlene Lineman, DO PGY-3 Internal Medicine Resident Pager # [phone] 08/30/2016 1:01 PM

## 2016-08-30 NOTE — Care Management Important Message (Signed)
Important Message  Patient Details  Name: Derrick Ramos MRN: [id number] Date of Birth: [birth year]   Medicare Important Message Given:  Yes    [name] 08/30/2016, 1:58 PM

## 2016-08-30 NOTE — H&P (View-Only) (Signed)
Progress Note  Patient Name: Derrick Ramos Date of Encounter: 08/30/2016  Primary Cardiologist: Allyson Sabal  Subjective   71 year old African-American male with history of alcohol abuse the past, cirrhosis, mixed ischemic and nonischemic cardiomyopathy status post proximal LAD stenting by Dr. Clifton James  11/29/15. He has a history of hypertension, tobacco abuse, GERD and PUD. His EF has been moderately reduced the past because of progressive dyspnea he had a Myoview stress test was nonischemic but had severe LV dysfunction and a 2-D echo showed an EF of 20% which was new. Patient has new symptoms of heart failure and was admitted for diuresis and further evaluation. Since admission he has diuresed nicely and his symptoms have markedly improved. He has had a right-sided thoracentesis.   Inpatient Medications    Scheduled Meds: . atorvastatin  20 mg Oral q1800  . calcium gluconate  1 g Intravenous Once  . carvedilol  3.125 mg Oral BID WC  . folic acid  1 mg Oral Daily  . furosemide  40 mg Intravenous BID  . iron polysaccharides  150 mg Oral Daily  . loratadine  10 mg Oral Daily  . multivitamin with minerals  1 tablet Oral Daily  . pantoprazole  40 mg Oral BID  . potassium chloride  40 mEq Oral BID  . ramipril  10 mg Oral Daily  . sodium chloride flush  3 mL Intravenous Q12H  . sodium chloride flush  3 mL Intravenous Q12H  . thiamine  100 mg Oral Daily   Or  . thiamine  100 mg Intravenous Daily   Continuous Infusions: . sodium chloride 1 mL/kg/hr (08/30/16 0722)   PRN Meds: sodium chloride, sodium chloride, acetaminophen, guaiFENesin, nitroGLYCERIN, ondansetron (ZOFRAN) IV, sodium chloride flush, sodium chloride flush   Vital Signs    Vitals:   08/29/16 1302 08/29/16 1700 08/29/16 2255 08/30/16 0519  BP: 122/73 (!) 162/97 112/72 (!) 133/94  Pulse: (!) 116 (!) 188 (!) 106 97  Resp: 20  18 18   Temp: 99.1 F (37.3 C)  98.3 F (36.8 C) 99.7 F (37.6 C)  TempSrc: Oral  Oral Oral    SpO2: 94%  97% 95%  Weight:    134 lb 3.2 oz (60.9 kg)  Height:        Intake/Output Summary (Last 24 hours) at 08/30/16 0914 Last data filed at 08/30/16 1610  Gross per 24 hour  Intake           694.46 ml  Output             1301 ml  Net          -606.54 ml   Filed Weights   08/27/16 2326 08/29/16 0258 08/30/16 0519  Weight: 140 lb (63.5 kg) 133 lb 1.6 oz (60.4 kg) 134 lb 3.2 oz (60.9 kg)    Telemetry   Normal sinus rhythm with PVCs, couplets and a run of nonsustained ventricular tachycardia - Personally Reviewed  ECG    Not performed today - Personally Reviewed  Physical Exam   GEN: No acute distress.   Neck: Elevated jugular venous pressure Cardiac: RRR, no murmurs, rubs, S3 summation gallop  Respiratory: Lung sounds are better today with better aeration of the right lower lobe  GI: Soft, nontender, non-distended  MS: No edema; No deformity. Neuro:  Nonfocal  Psych: Normal affect  Extremities:  trace bilateral lower extremity edema improved from yesterday    Labs    Chemistry  Recent Labs Lab 08/27/16 1805 08/28/16 0047  08/29/16 1136 08/29/16 1628 08/30/16 0255  NA 139 140  < > 141 139 138  K 3.7 3.6  < > 3.1* 3.8 3.8  CL 106 107  < > 106 105 104  CO2 16* 20*  < > 22 22 24   GLUCOSE 93 88  < > 116* 102* 94  BUN 21* 21*  < > 17 15 13   CREATININE 1.84* 1.76*  < > 1.45* 1.35* 1.35*  CALCIUM 6.6* 6.5*  < > 6.4* 6.5* 7.5*  PROT 7.3 6.9  --   --   --  6.5  ALBUMIN 3.4* 3.2*  --   --   --  2.8*  AST 52* 51*  --   --   --  48*  ALT 46 47  --   --   --  41  ALKPHOS 124 115  --   --   --  107  BILITOT 1.2 1.6*  --   --   --  1.7*  GFRNONAA 35* 37*  < > 47* 51* 51*  GFRAA 41* 43*  < > 54* 59* 59*  ANIONGAP 17* 13  < > 13 12 10   < > = values in this interval not displayed.   Hematology  Recent Labs Lab 08/29/16 0214 08/29/16 1136 08/30/16 0255  WBC 10.1 9.8 9.5  RBC 5.04 4.68 4.81  HGB 10.1* 9.5* 9.7*  HCT 33.5* 31.0* 31.8*  MCV 66.5* 66.2* 66.1*   MCH 20.0* 20.3* 20.2*  MCHC 30.1 30.6 30.5  RDW 25.5* 25.6* 26.3*  PLT 367 339 322    Cardiac Enzymes  Recent Labs Lab 08/28/16 0047 08/28/16 0617  TROPONINI 0.03* 0.03*     Recent Labs Lab 08/27/16 1917  TROPIPOC 0.00     BNP  Recent Labs Lab 08/27/16 1750  BNP 2,433.0*     DDimer No results for input(s): DDIMER in the last 168 hours.   Radiology    Dg Chest 2 View  Result Date: 08/28/2016 CLINICAL DATA:  Status post thoracentesis. Three weeks shortness of breath with onset of cough today. Former smoker. EXAM: CHEST  2 VIEW COMPARISON:  Chest x-ray of August 27, 2016 FINDINGS: There remains a moderate-sized right pleural effusion. Parenchymal density in the right middle and lower lobe is less prominent today. There is a small left pleural effusion. No pneumothorax or pneumomediastinum is observed. The cardiac silhouette is enlarged. There is tortuosity of the ascending and descending thoracic aorta. There is calcification in the wall of the aortic arch. The mediastinum is normal in width. There are 6 through eighth left rib fractures are unchanged in appearance sh since earlier today but have appeared since the December 01, 2015 study. IMPRESSION: Decreased volume of pleural fluid on the right with some improved aeration of the right middle and lower lobes but persistent interstitial density in the right lower lung is present. No pneumothorax. Stable small left pleural effusion. Thoracic aortic atherosclerosis. Electronically Signed   By: David  [place] M.D.   On: 08/28/2016 16:12    Cardiac Studies   2D Echo-   Study Conclusions  - Left ventricle: The cavity size was normal. Wall thickness was   increased in a pattern of moderate LVH. Systolic function was   severely reduced. The estimated ejection fraction was in the   range of 20% to 25%. Diffuse hypokinesis. The study is not   technically sufficient to allow evaluation of LV diastolic   function. - Aortic valve:  Moderately calcified. No stenosis. Mild  regurgitation. - Aorta: Mildly dilated ascending aorta. Aortic root dimension: 39   mm (ED). Ascending aortic diameter: 41 mm (S). - Mitral valve: There was mild regurgitation. - Left atrium: Moderately dilated. - Right ventricle: The cavity size was mildly dilated. - Right atrium: Severely dilated. - Tricuspid valve: There was moderate to severe eccentric   regurgitation, directed at the interatrial septum. - Pulmonary arteries: PA peak pressure: 50 mm Hg (S). - Pericardium, extracardiac: A trivial pericardial effusion was   identified posterior to the heart. Features were not consistent   with tamponade physiology. There was a right pleural effusion.   There was a left pleural effusion.  Impressions:  - LVEF 20-25%, moderate LVH, global hypokinesis, mild AI (probably   mild aortic stenosis), dilated aorta to 4.1 cm, mild MR, moderate   LAE, mild RVE, severe RAE, moderate to severe eccentric TR, RVSP   50 mmHg, bilateral pleural effusions, trivial pericardial   effusion without tamponade features.   Patient Profile     71 y.o. male with a history of alcohol abuse, cirrhosis, mixed ischemic and nonischemic cardiomyopathy status post bare metal stenting of his proximal LAD July 2017. He's had progressive dyspnea with recent Myoview that showed no ischemia but severe LV dysfunction. 2-D echo revealed an EF of 20-25%  with severe TR and RV dilatation. He was admitted because of this and symptoms of heart failure. He had elevated jugular venous pressure and peripheral edema. Chest x-ray did show a large right pleural effusion and blood work showed a hemoglobin of 6.6. He was transfused 2 units of packed red blood cells with a resulting hemoglobin of 8.3. She is getting Lasix IV twice a day with mild diuresis. Thoracentesis is planned. At some point once he is euvolemic we will plan right and left heart cath to further define his anatomy and  physiology.  Assessment & Plan    1: Ischemic cardiomyopathy-history of mixed ischemic and nonischemic cardiomyopathy probably related to ethanol abuse. His EF has declined to 20-25% range with a recent nonischemic Myoview. He has signs and symptoms of volume overload.He has been diuresis with twice a day diuretics. His eyesare -1.3 L. His lower extremity edema has improved as has his jugular venous pressureHe was put on metoprolol 12 mg by mouth twice a day. I suspect carvedilol may be a better drug for him and I suggest starting at 3.125 mg by mouth twice a day and titrating . We will titrate the beta blocker as tolerated. I will be performing right left heart cath on him later today.   2: Iron deficiency anemia-probably multifactorial from a GI source however worry about other etiologies such as varices with history of cirrhosis. He has had endoscopy in the past. He's had transfusion 2 units packed red blood cells with a resulting increase in his hemoglobin from 6.6 up to  to 10.1 . Agree with initiation of H2 blockers.  3: Dyspnea on exertion-probably multifactorial from systolic heart failure and volume overload as well as a large right pleural effusion.He had thoracentesis on the right side yesterday removing 1.4 L of fluid. He feels symptomatically improved. Postprocedure chest x-ray showed improved aeration on the right with residual interstitial edema. His I/O 's -1.9 L. He has no peripheral edema at this point.  4: Nonsustained ventricular tachycardia-patient had a 20 beat run of nonsustained ventricular tachycardia. His potassium and magnesium were low which are being repleted appropriately. He is on low-dose metoprolol. I suggest switching him to carvedilol. He  has had no further ventricular tachycardia. His electrolytes have been repleted. We will titrate his beta blocker as tolerated.  We will follow with you  Signed, [name], MD  08/30/2016, 9:14 AM

## 2016-08-30 NOTE — Progress Notes (Signed)
Progress Note  Patient Name: Derrick Ramos Date of Encounter: 08/30/2016  Primary Cardiologist: Allyson Sabal  Subjective   71 year old African-American male with history of alcohol abuse the past, cirrhosis, mixed ischemic and nonischemic cardiomyopathy status post proximal LAD stenting by Dr. Clifton James  11/29/15. He has a history of hypertension, tobacco abuse, GERD and PUD. His EF has been moderately reduced the past because of progressive dyspnea he had a Myoview stress test was nonischemic but had severe LV dysfunction and a 2-D echo showed an EF of 20% which was new. Patient has new symptoms of heart failure and was admitted for diuresis and further evaluation. Since admission he has diuresed nicely and his symptoms have markedly improved. He has had a right-sided thoracentesis.   Inpatient Medications    Scheduled Meds: . atorvastatin  20 mg Oral q1800  . calcium gluconate  1 g Intravenous Once  . carvedilol  3.125 mg Oral BID WC  . folic acid  1 mg Oral Daily  . furosemide  40 mg Intravenous BID  . iron polysaccharides  150 mg Oral Daily  . loratadine  10 mg Oral Daily  . multivitamin with minerals  1 tablet Oral Daily  . pantoprazole  40 mg Oral BID  . potassium chloride  40 mEq Oral BID  . ramipril  10 mg Oral Daily  . sodium chloride flush  3 mL Intravenous Q12H  . sodium chloride flush  3 mL Intravenous Q12H  . thiamine  100 mg Oral Daily   Or  . thiamine  100 mg Intravenous Daily   Continuous Infusions: . sodium chloride 1 mL/kg/hr (08/30/16 0722)   PRN Meds: sodium chloride, sodium chloride, acetaminophen, guaiFENesin, nitroGLYCERIN, ondansetron (ZOFRAN) IV, sodium chloride flush, sodium chloride flush   Vital Signs    Vitals:   08/29/16 1302 08/29/16 1700 08/29/16 2255 08/30/16 0519  BP: 122/73 (!) 162/97 112/72 (!) 133/94  Pulse: (!) 116 (!) 188 (!) 106 97  Resp: 20  18 18   Temp: 99.1 F (37.3 C)  98.3 F (36.8 C) 99.7 F (37.6 C)  TempSrc: Oral  Oral Oral    SpO2: 94%  97% 95%  Weight:    134 lb 3.2 oz (60.9 kg)  Height:        Intake/Output Summary (Last 24 hours) at 08/30/16 0914 Last data filed at 08/30/16 1610  Gross per 24 hour  Intake           694.46 ml  Output             1301 ml  Net          -606.54 ml   Filed Weights   08/27/16 2326 08/29/16 0258 08/30/16 0519  Weight: 140 lb (63.5 kg) 133 lb 1.6 oz (60.4 kg) 134 lb 3.2 oz (60.9 kg)    Telemetry   Normal sinus rhythm with PVCs, couplets and a run of nonsustained ventricular tachycardia - Personally Reviewed  ECG    Not performed today - Personally Reviewed  Physical Exam   GEN: No acute distress.   Neck: Elevated jugular venous pressure Cardiac: RRR, no murmurs, rubs, S3 summation gallop  Respiratory: Lung sounds are better today with better aeration of the right lower lobe  GI: Soft, nontender, non-distended  MS: No edema; No deformity. Neuro:  Nonfocal  Psych: Normal affect  Extremities:  trace bilateral lower extremity edema improved from yesterday    Labs    Chemistry  Recent Labs Lab 08/27/16 1805 08/28/16 0047  08/29/16 1136 08/29/16 1628 08/30/16 0255  NA 139 140  < > 141 139 138  K 3.7 3.6  < > 3.1* 3.8 3.8  CL 106 107  < > 106 105 104  CO2 16* 20*  < > 22 22 24   GLUCOSE 93 88  < > 116* 102* 94  BUN 21* 21*  < > 17 15 13   CREATININE 1.84* 1.76*  < > 1.45* 1.35* 1.35*  CALCIUM 6.6* 6.5*  < > 6.4* 6.5* 7.5*  PROT 7.3 6.9  --   --   --  6.5  ALBUMIN 3.4* 3.2*  --   --   --  2.8*  AST 52* 51*  --   --   --  48*  ALT 46 47  --   --   --  41  ALKPHOS 124 115  --   --   --  107  BILITOT 1.2 1.6*  --   --   --  1.7*  GFRNONAA 35* 37*  < > 47* 51* 51*  GFRAA 41* 43*  < > 54* 59* 59*  ANIONGAP 17* 13  < > 13 12 10   < > = values in this interval not displayed.   Hematology  Recent Labs Lab 08/29/16 0214 08/29/16 1136 08/30/16 0255  WBC 10.1 9.8 9.5  RBC 5.04 4.68 4.81  HGB 10.1* 9.5* 9.7*  HCT 33.5* 31.0* 31.8*  MCV 66.5* 66.2* 66.1*   MCH 20.0* 20.3* 20.2*  MCHC 30.1 30.6 30.5  RDW 25.5* 25.6* 26.3*  PLT 367 339 322    Cardiac Enzymes  Recent Labs Lab 08/28/16 0047 08/28/16 0617  TROPONINI 0.03* 0.03*     Recent Labs Lab 08/27/16 1917  TROPIPOC 0.00     BNP  Recent Labs Lab 08/27/16 1750  BNP 2,433.0*     DDimer No results for input(s): DDIMER in the last 168 hours.   Radiology    Dg Chest 2 View  Result Date: 08/28/2016 CLINICAL DATA:  Status post thoracentesis. Three weeks shortness of breath with onset of cough today. Former smoker. EXAM: CHEST  2 VIEW COMPARISON:  Chest x-ray of August 27, 2016 FINDINGS: There remains a moderate-sized right pleural effusion. Parenchymal density in the right middle and lower lobe is less prominent today. There is a small left pleural effusion. No pneumothorax or pneumomediastinum is observed. The cardiac silhouette is enlarged. There is tortuosity of the ascending and descending thoracic aorta. There is calcification in the wall of the aortic arch. The mediastinum is normal in width. There are 6 through eighth left rib fractures are unchanged in appearance sh since earlier today but have appeared since the December 01, 2015 study. IMPRESSION: Decreased volume of pleural fluid on the right with some improved aeration of the right middle and lower lobes but persistent interstitial density in the right lower lung is present. No pneumothorax. Stable small left pleural effusion. Thoracic aortic atherosclerosis. Electronically Signed   By: David  [place] M.D.   On: 08/28/2016 16:12    Cardiac Studies   2D Echo-   Study Conclusions  - Left ventricle: The cavity size was normal. Wall thickness was   increased in a pattern of moderate LVH. Systolic function was   severely reduced. The estimated ejection fraction was in the   range of 20% to 25%. Diffuse hypokinesis. The study is not   technically sufficient to allow evaluation of LV diastolic   function. - Aortic valve:  Moderately calcified. No stenosis. Mild  regurgitation. - Aorta: Mildly dilated ascending aorta. Aortic root dimension: 39   mm (ED). Ascending aortic diameter: 41 mm (S). - Mitral valve: There was mild regurgitation. - Left atrium: Moderately dilated. - Right ventricle: The cavity size was mildly dilated. - Right atrium: Severely dilated. - Tricuspid valve: There was moderate to severe eccentric   regurgitation, directed at the interatrial septum. - Pulmonary arteries: PA peak pressure: 50 mm Hg (S). - Pericardium, extracardiac: A trivial pericardial effusion was   identified posterior to the heart. Features were not consistent   with tamponade physiology. There was a right pleural effusion.   There was a left pleural effusion.  Impressions:  - LVEF 20-25%, moderate LVH, global hypokinesis, mild AI (probably   mild aortic stenosis), dilated aorta to 4.1 cm, mild MR, moderate   LAE, mild RVE, severe RAE, moderate to severe eccentric TR, RVSP   50 mmHg, bilateral pleural effusions, trivial pericardial   effusion without tamponade features.   Patient Profile     71 y.o. male with a history of alcohol abuse, cirrhosis, mixed ischemic and nonischemic cardiomyopathy status post bare metal stenting of his proximal LAD July 2017. He's had progressive dyspnea with recent Myoview that showed no ischemia but severe LV dysfunction. 2-D echo revealed an EF of 20-25%  with severe TR and RV dilatation. He was admitted because of this and symptoms of heart failure. He had elevated jugular venous pressure and peripheral edema. Chest x-ray did show a large right pleural effusion and blood work showed a hemoglobin of 6.6. He was transfused 2 units of packed red blood cells with a resulting hemoglobin of 8.3. She is getting Lasix IV twice a day with mild diuresis. Thoracentesis is planned. At some point once he is euvolemic we will plan right and left heart cath to further define his anatomy and  physiology.  Assessment & Plan    1: Ischemic cardiomyopathy-history of mixed ischemic and nonischemic cardiomyopathy probably related to ethanol abuse. His EF has declined to 20-25% range with a recent nonischemic Myoview. He has signs and symptoms of volume overload.He has been diuresis with twice a day diuretics. His eyesare -1.3 L. His lower extremity edema has improved as has his jugular venous pressureHe was put on metoprolol 12 mg by mouth twice a day. I suspect carvedilol may be a better drug for him and I suggest starting at 3.125 mg by mouth twice a day and titrating . We will titrate the beta blocker as tolerated. I will be performing right left heart cath on him later today.   2: Iron deficiency anemia-probably multifactorial from a GI source however worry about other etiologies such as varices with history of cirrhosis. He has had endoscopy in the past. He's had transfusion 2 units packed red blood cells with a resulting increase in his hemoglobin from 6.6 up to  to 10.1 . Agree with initiation of H2 blockers.  3: Dyspnea on exertion-probably multifactorial from systolic heart failure and volume overload as well as a large right pleural effusion.He had thoracentesis on the right side yesterday removing 1.4 L of fluid. He feels symptomatically improved. Postprocedure chest x-ray showed improved aeration on the right with residual interstitial edema. His I/O 's -1.9 L. He has no peripheral edema at this point.  4: Nonsustained ventricular tachycardia-patient had a 20 beat run of nonsustained ventricular tachycardia. His potassium and magnesium were low which are being repleted appropriately. He is on low-dose metoprolol. I suggest switching him to carvedilol. He  has had no further ventricular tachycardia. His electrolytes have been repleted. We will titrate his beta blocker as tolerated.  We will follow with you  Signed, [name], MD  08/30/2016, 9:14 AM

## 2016-08-30 NOTE — Progress Notes (Signed)
Internal Medicine Attending:   I saw and examined the patient. I reviewed the resident's note and I agree with the resident's findings and plan as documented in the resident's note.  71 year old man hospital day #4 with acute on chronic heart failure with reduced ejection fraction. Left and right heart catheterizations today went well, no new critical coronary artery disease requiring PCI. Left ventricular end-diastolic pressure was relatively good around 20, may benefit from a little more diuresis. Fick index was reduced around 2.8 L/m. He continues to feel better with diuresis, he was laying nearly flat sleeping on my exam today. I think we can continue with the Lasix 40 mg IV twice a day for today, ambulate tomorrow to look for more hypoxia. I think he is getting close to euvolemic. We'll continue with ramipril 10 mg and carvedilol 3.125 mg for now, cardiology following and we appreciate their assistance.

## 2016-08-30 NOTE — Interval H&P Note (Signed)
Cath Lab Visit (complete for each Cath Lab visit)  Clinical Evaluation Leading to the Procedure:   ACS: No.  Non-ACS:    Anginal Classification: CCS II  Anti-ischemic medical therapy: Minimal Therapy (1 class of medications)  Non-Invasive Test Results: High-risk stress test findings: cardiac mortality >3%/year  Prior CABG: No previous CABG      History and Physical Interval Note:  08/30/2016 10:40 AM  Derrick Ramos  has presented today for surgery, with the diagnosis of heart failure  The various methods of treatment have been discussed with the patient and family. After consideration of risks, benefits and other options for treatment, the patient has consented to  Procedure(s): Right/Left Heart Cath and Coronary Angiography (N/A) as a surgical intervention .  The patient's history has been reviewed, patient examined, no change in status, stable for surgery.  I have reviewed the patient's chart and labs.  Questions were answered to the patient's satisfaction.     Derrick Ramos

## 2016-08-31 LAB — CBC
HCT: 29.6 % — ABNORMAL LOW (ref 39.0–52.0)
HEMOGLOBIN: 8.8 g/dL — AB (ref 13.0–17.0)
MCH: 19.8 pg — ABNORMAL LOW (ref 26.0–34.0)
MCHC: 29.7 g/dL — ABNORMAL LOW (ref 30.0–36.0)
MCV: 66.7 fL — AB (ref 78.0–100.0)
PLATELETS: 279 10*3/uL (ref 150–400)
RBC: 4.44 MIL/uL (ref 4.22–5.81)
RDW: 26.3 % — ABNORMAL HIGH (ref 11.5–15.5)
WBC: 8.5 10*3/uL (ref 4.0–10.5)

## 2016-08-31 LAB — BASIC METABOLIC PANEL
ANION GAP: 9 (ref 5–15)
BUN: 8 mg/dL (ref 6–20)
CHLORIDE: 105 mmol/L (ref 101–111)
CO2: 23 mmol/L (ref 22–32)
Calcium: 7.7 mg/dL — ABNORMAL LOW (ref 8.9–10.3)
Creatinine, Ser: 1.08 mg/dL (ref 0.61–1.24)
GFR calc Af Amer: >60 mL/min (ref >60)
GLUCOSE: 96 mg/dL (ref 65–99)
POTASSIUM: 4.2 mmol/L (ref 3.5–5.1)
Sodium: 137 mmol/L (ref 135–145)

## 2016-08-31 LAB — MAGNESIUM: MAGNESIUM: 1.6 mg/dL — AB (ref 1.7–2.4)

## 2016-08-31 MED ORDER — FUROSEMIDE 40 MG PO TABS: 40.0000 mg | ORAL_TABLET | Freq: Every day | ORAL | 0 refills | Status: DC

## 2016-08-31 MED ORDER — CARVEDILOL 6.25 MG PO TABS: 6.2500 mg | ORAL_TABLET | Freq: Two times a day (BID) | ORAL | Status: DC

## 2016-08-31 MED ORDER — ASPIRIN EC 81 MG PO TBEC
81.0000 mg | DELAYED_RELEASE_TABLET | Freq: Every day | ORAL | Status: DC
  Administered 2016-08-31: 81 mg via ORAL
  Filled 2016-08-31: qty 1

## 2016-08-31 MED ORDER — POLYSACCHARIDE IRON COMPLEX 150 MG PO CAPS: 150.0000 mg | ORAL_CAPSULE | Freq: Every day | ORAL | 0 refills | Status: AC

## 2016-08-31 MED ORDER — MAGNESIUM SULFATE 2 GM/50ML IV SOLN
2.0000 g | Freq: Once | INTRAVENOUS | Status: AC
Start: 2016-08-31 — End: 2016-08-31
  Administered 2016-08-31: 2 g via INTRAVENOUS
  Filled 2016-08-31: qty 50

## 2016-08-31 MED ORDER — ENOXAPARIN SODIUM 40 MG/0.4ML ~~LOC~~ SOLN
40.0000 mg | SUBCUTANEOUS | Status: DC
  Administered 2016-08-31: 40 mg via SUBCUTANEOUS
  Filled 2016-08-31: qty 0.4

## 2016-08-31 MED ORDER — CARVEDILOL 6.25 MG PO TABS: 6.2500 mg | ORAL_TABLET | Freq: Two times a day (BID) | ORAL | 0 refills | Status: DC

## 2016-08-31 MED ORDER — ATORVASTATIN CALCIUM 40 MG PO TABS: 40.0000 mg | ORAL_TABLET | Freq: Every day | ORAL | Status: DC

## 2016-08-31 MED ORDER — ATORVASTATIN CALCIUM 40 MG PO TABS: 40.0000 mg | ORAL_TABLET | Freq: Every day | ORAL | 0 refills | Status: DC

## 2016-08-31 MED ORDER — FUROSEMIDE 40 MG PO TABS
40.0000 mg | ORAL_TABLET | Freq: Two times a day (BID) | ORAL | Status: DC
Start: 2016-08-31 — End: 2016-08-31

## 2016-08-31 NOTE — Discharge Instructions (Signed)
You were admitted to the hospital because of shortness of breath.  We found that your heart was not pumping as well as it used to, causing fluid to back up in your lungs.  You blood counts were also low, making you more short of breath.  We gave you a blood transfusion to get your blood counts up, and also drained fluid from around your lung with a needle and gave you medicines to help get off the fluid by urinating.  Please call the offices of Dr. Earnest Bailey and Dr. Allyson Sabal on [address] and schedule follow-up appointments as soon as possible.  You need to see Dr Earnest Bailey or someone she works with this week.  If you get worsening swelling or shortness of breath, please call your doctor right away.  If you breathing gets very bad, or you have chest pain or pass out, please call 911 and come back to the ED.   Heart Failure Heart failure is a condition in which the heart has trouble pumping blood because it has become weak or stiff. This means that the heart does not pump blood efficiently for the body to work well. For some people with heart failure, fluid may back up into the lungs and there may be swelling (edema) in the lower legs. Heart failure is usually a long-term (chronic) condition. It is important for you to take good care of yourself and follow the treatment plan from your health care provider. What are the causes? This condition is caused by some health problems, including:  High blood pressure (hypertension). Hypertension causes the heart muscle to work harder than normal. High blood pressure eventually causes the heart to become stiff and weak.  Coronary artery disease (CAD). CAD is the buildup of cholesterol and fat (plaques) in the arteries of the heart.  Heart attack (myocardial infarction). Injured tissue, which is caused by the heart attack, does not contract as well and the heart's ability to pump blood is weakened.  Abnormal heart valves. When the heart valves do not open and close  properly, the heart muscle must pump harder to keep the blood flowing.  Heart muscle disease (cardiomyopathy or myocarditis). Heart muscle disease is damage to the heart muscle from a variety of causes, such as drug or alcohol abuse, infections, or unknown causes. These can increase the risk of heart failure.  Lung disease. When the lungs do not work properly, the heart must work harder. What increases the risk? Risk of heart failure increases as a person ages. This condition is also more likely to develop in people who:  Are overweight.  Are male.  Smoke or chew tobacco.  Abuse alcohol or illegal drugs.  Have taken medicines that can damage the heart, such as chemotherapy drugs.  Have diabetes.  High blood sugar (glucose) is associated with high fat (lipid) levels in the blood.  Diabetes can also damage tiny blood vessels that carry nutrients to the heart muscle.  Have abnormal heart rhythms.  Have thyroid problems.  Have low blood counts (anemia). What are the signs or symptoms? Symptoms of this condition include:  Shortness of breath with activity, such as when climbing stairs.  Persistent cough.  Swelling of the feet, ankles, legs, or abdomen.  Unexplained weight gain.  Difficulty breathing when lying flat (orthopnea).  Waking from sleep because of the need to sit up and get more air.  Rapid heartbeat.  Fatigue and loss of energy.  Feeling light-headed, dizzy, or close to fainting.  Loss of  appetite.  Nausea.  Increased urination during the night (nocturia).  Confusion. How is this diagnosed? This condition is diagnosed based on:  Medical history, symptoms, and a physical exam.  Diagnostic tests, which may include:  Echocardiogram.  Electrocardiogram (ECG).  Chest X-ray.  Blood tests.  Exercise stress test.  Radionuclide scans.  Cardiac catheterization and angiogram. How is this treated? Treatment for this condition is aimed at  managing the symptoms of heart failure. Medicines, behavioral changes, or other treatments may be necessary to treat heart failure. Medicines  These may include:  Angiotensin-converting enzyme (ACE) inhibitors. This type of medicine blocks the effects of a blood protein called angiotensin-converting enzyme. ACE inhibitors relax (dilate) the blood vessels and help to lower blood pressure.  Angiotensin receptor blockers (ARBs). This type of medicine blocks the actions of a blood protein called angiotensin. ARBs dilate the blood vessels and help to lower blood pressure.  Water pills (diuretics). Diuretics cause the kidneys to remove salt and water from the blood. The extra fluid is removed through urination, leaving a lower volume of blood that the heart has to pump.  Beta blockers. These improve heart muscle strength and they prevent the heart from beating too quickly.  Digoxin. This increases the force of the heartbeat. Healthy behavior changes  These may include:  Reaching and maintaining a healthy weight.  Stopping smoking or chewing tobacco.  Eating heart-healthy foods.  Limiting or avoiding alcohol.  Stopping use of street drugs (illegal drugs).  Physical activity. Other treatments  These may include:  Surgery to open blocked coronary arteries or repair damaged heart valves.  Placement of a biventricular pacemaker to improve heart muscle function (cardiac resynchronization therapy). This device paces both the right ventricle and left ventricle.  Placement of a device to treat serious abnormal heart rhythms (implantable cardioverter defibrillator, or ICD).  Placement of a device to improve the pumping ability of the heart (left ventricular assist device, or LVAD).  Heart transplant. This can cure heart failure, and it is considered for certain patients who do not improve with other therapies. Follow these instructions at home: Medicines   Take over-the-counter and  prescription medicines only as told by your health care provider. Medicines are important in reducing the workload of your heart, slowing the progression of heart failure, and improving your symptoms.  Do not stop taking your medicine unless your health care provider told you to do that.  Do not skip any dose of medicine.  Refill your prescriptions before you run out of medicine. You need your medicines every day. Eating and drinking    Eat heart-healthy foods. Talk with a dietitian to make an eating plan that is right for you.  Choose foods that contain no trans fat and are low in saturated fat and cholesterol. Healthy choices include fresh or frozen fruits and vegetables, fish, lean meats, legumes, fat-free or low-fat dairy products, and whole-grain or high-fiber foods.  Limit salt (sodium) if directed by your health care provider. Sodium restriction may reduce symptoms of heart failure. Ask a dietitian to recommend heart-healthy seasonings.  Use healthy cooking methods instead of frying. Healthy methods include roasting, grilling, broiling, baking, poaching, steaming, and stir-frying.  Limit your fluid intake if directed by your health care provider. Fluid restriction may reduce symptoms of heart failure. Lifestyle   Stop smoking or using chewing tobacco. Nicotine and tobacco can damage your heart and your blood vessels. Do not use nicotine gum or patches before talking to your health care provider.  Limit alcohol intake to no more than 1 drink per day for non-pregnant women and 2 drinks per day for men. One drink equals 12 oz of beer, 5 oz of wine, or 1 oz of hard liquor.  Drinking more than that is harmful to your heart. Tell your health care provider if you drink alcohol several times a week.  Talk with your health care provider about whether any level of alcohol use is safe for you.  If your heart has already been damaged by alcohol or you have severe heart failure, drinking  alcohol should be stopped completely.  Stop use of illegal drugs.  Lose weight if directed by your health care provider. Weight loss may reduce symptoms of heart failure.  Do moderate physical activity if directed by your health care provider. People who are elderly and people with severe heart failure should consult with a health care provider for physical activity recommendations. Monitor important information   Weigh yourself every day. Keeping track of your weight daily helps you to notice excess fluid sooner.  Weigh yourself every morning after you urinate and before you eat breakfast.  Wear the same amount of clothing each time you weigh yourself.  Record your daily weight. Provide your health care provider with your weight record.  Monitor and record your blood pressure as told by your health care provider.  Check your pulse as told by your health care provider. Dealing with extreme temperatures   If the weather is extremely hot:  Avoid vigorous physical activity.  Use air conditioning or fans or seek a cooler location.  Avoid caffeine and alcohol.  Wear loose-fitting, lightweight, and light-colored clothing.  If the weather is extremely cold:  Avoid vigorous physical activity.  Layer your clothes.  Wear mittens or gloves, a hat, and a scarf when you go outside.  Avoid alcohol. General instructions   Manage other health conditions such as hypertension, diabetes, thyroid disease, or abnormal heart rhythms as told by your health care provider.  Learn to manage stress. If you need help to do this, ask your health care provider.  Plan rest periods when fatigued.  Get ongoing education and support as needed.  Participate in or seek rehabilitation as needed to maintain or improve independence and quality of life.  Stay up to date with immunizations. Keeping current on pneumococcal and influenza immunizations is especially important to prevent respiratory  infections.  Keep all follow-up visits as told by your health care provider. This is important. Contact a health care provider if:  You have a rapid weight gain.  You have increasing shortness of breath that is unusual for you.  You are unable to participate in your usual physical activities.  You tire easily.  You cough more than normal, especially with physical activity.  You have any swelling or more swelling in areas such as your hands, feet, ankles, or abdomen.  You are unable to sleep because it is hard to breathe.  You feel like your heart is beating quickly (palpitations).  You become dizzy or light-headed when you stand up. Get help right away if:  You have difficulty breathing.  You notice or your family notices a change in your awareness, such as having trouble staying awake or having difficulty with concentration.  You have pain or discomfort in your chest.  You have an episode of fainting (syncope). This information is not intended to replace advice given to you by your health care provider. Make sure you discuss any questions  you have with your health care provider. Document Released: 05/06/2005 Document Revised: 01/09/2016 Document Reviewed: 11/29/2015 Elsevier Interactive Patient Education  2017 [employer].

## 2016-08-31 NOTE — Progress Notes (Addendum)
Progress Note  Patient Name: Derrick Ramos Date of Encounter: 08/31/2016  Primary Cardiologist: Dr. Allyson Sabal  Subjective   Breathing better.  No chest pain  Inpatient Medications    Scheduled Meds: . atorvastatin  40 mg Oral q1800  . carvedilol  3.125 mg Oral BID WC  . enoxaparin (LOVENOX) injection  40 mg Subcutaneous Q24H  . folic acid  1 mg Oral Daily  . furosemide  40 mg Intravenous BID  . iron polysaccharides  150 mg Oral Daily  . loratadine  10 mg Oral Daily  . multivitamin with minerals  1 tablet Oral Daily  . pantoprazole  40 mg Oral BID  . ramipril  10 mg Oral Daily  . sodium chloride flush  3 mL Intravenous Q12H  . sodium chloride flush  3 mL Intravenous Q12H  . thiamine  100 mg Oral Daily   Or  . thiamine  100 mg Intravenous Daily   Continuous Infusions:  PRN Meds: sodium chloride, sodium chloride, acetaminophen, acetaminophen, guaiFENesin, hydrALAZINE, morphine injection, nitroGLYCERIN, ondansetron (ZOFRAN) IV, ondansetron (ZOFRAN) IV, sodium chloride flush, sodium chloride flush   Vital Signs    Vitals:   08/30/16 1430 08/30/16 1500 08/30/16 2108 08/31/16 0413  BP: 123/70 105/71 119/73 (!) 141/93  Pulse:   100 100  Resp:   18 18  Temp:   99.7 F (37.6 C) 98.6 F (37 C)  TempSrc:   Oral Oral  SpO2:   100% 97%  Weight:    129 lb 12.8 oz (58.9 kg)  Height:        Intake/Output Summary (Last 24 hours) at 08/31/16 0837 Last data filed at 08/31/16 0414  Gross per 24 hour  Intake              720 ml  Output              375 ml  Net              345 ml   Filed Weights   08/29/16 0258 08/30/16 0519 08/31/16 0413  Weight: 133 lb 1.6 oz (60.4 kg) 134 lb 3.2 oz (60.9 kg) 129 lb 12.8 oz (58.9 kg)    Telemetry    NSR, with atrial tachycardia, PVCs and short 3 -4 beat runs of Vtach - Personally Reviewed  ECG    NA - Personally Reviewed  Physical Exam   GEN: No acute distress.  Frail Neck: No  JVD Cardiac: RRR, no murmurs, rubs, or gallops.    Respiratory: Decreased breath sounds with crackles at the bases. GI: Soft, nontender, non-distended  MS: Mild ankle edema; No deformity.  Right femoral access site without bruising or bleeding.  Neuro:  Nonfocal  Psych: Normal affect   Labs    Chemistry Recent Labs Lab 08/27/16 1805 08/28/16 0047  08/30/16 0255 08/30/16 1222 08/31/16 0345  NA 139 140  < > 138 137 137  K 3.7 3.6  < > 3.8 3.6 4.2  CL 106 107  < > 104 105 105  CO2 16* 20*  < > 24 24 23   GLUCOSE 93 88  < > 94 99 96  BUN 21* 21*  < > 13 10 8   CREATININE 1.84* 1.76*  < > 1.35* 1.21 1.08  CALCIUM 6.6* 6.5*  < > 7.5* 7.8* 7.7*  PROT 7.3 6.9  --  6.5  --   --   ALBUMIN 3.4* 3.2*  --  2.8*  --   --   AST 52* 51*  --  48*  --   --   ALT 46 47  --  41  --   --   ALKPHOS 124 115  --  107  --   --   BILITOT 1.2 1.6*  --  1.7*  --   --   GFRNONAA 35* 37*  < > 51* 58* >60  GFRAA 41* 43*  < > 59* >60 >60  ANIONGAP 17* 13  < > < > = values in this interval not displayed.   Hematology Recent Labs Lab 08/30/16 0255 08/30/16 1222 08/31/16 0345  WBC 9.5 9.0 8.5  RBC 4.81 4.80 4.44  HGB 9.7* 9.7* 8.8*  HCT 31.8* 32.2* 29.6*  MCV 66.1* 67.1* 66.7*  MCH 20.2* 20.2* 19.8*  MCHC 30.5 30.1 29.7*  RDW 26.3* 25.9* 26.3*  PLT 322 305 279    Cardiac Enzymes Recent Labs Lab 08/28/16 0047 08/28/16 0617  TROPONINI 0.03* 0.03*    Recent Labs Lab 08/27/16 1917  TROPIPOC 0.00     BNP Recent Labs Lab 08/27/16 1750  BNP 2,433.0*     DDimer No results for input(s): DDIMER in the last 168 hours.   Radiology    No results found.  Cardiac Studies   ECHO: - Left ventricle: The cavity size was normal. Wall thickness was increased in a pattern of moderate LVH. Systolic function was severely reduced. The estimated ejection fraction was in the range of 20% to 25%. Diffuse hypokinesis. The study is not technically sufficient to allow evaluation of LV diastolic function. - Aortic valve:  Moderately calcified. No stenosis. Mild regurgitation. - Aorta: Mildly dilated ascending aorta. Aortic root dimension: 39 mm (ED). Ascending aortic diameter: 41 mm (S). - Mitral valve: There was mild regurgitation. - Left atrium: Moderately dilated. - Right ventricle: The cavity size was mildly dilated. - Right atrium: Severely dilated. - Tricuspid valve: There was moderate to severe eccentric regurgitation, directed at the interatrial septum. - Pulmonary arteries: PA peak pressure: 50 mm Hg (S). - Pericardium, extracardiac: A trivial pericardial effusion was identified posterior to the heart. Features were not consistent with tamponade physiology. There was a right pleural effusion. There was a left pleural effusion.  Cath  Ost LAD to Prox LAD lesion, 0 %stenosed.  Ost 2nd Diag to 2nd Diag lesion, 75 %stenosed.  Dist LAD lesion, 70 %stenosed.  There is severe left ventricular systolic dysfunction.  LV end diastolic pressure is mildly elevated.  The left ventricular ejection fraction is less than 25% by visual estimate.     Patient Profile     71 y.o. 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 Clifton Beanca Kiester  11/29/15. He has a history of hypertension, tobacco abuse, GERD and PUD. His EF has been moderately reduced the past because of progressive dyspnea he had a Myoview stress test was nonischemic but had severe LV dysfunction and a 2-D echo showed an EF of 20% which was new. Patient has new symptoms of heart failure and was admitted for diuresis and further evaluation. Since admission he has diuresed nicely and his symptoms have markedly improved. He has had a right-sided thoracentesis.  Assessment & Plan    ACUTE SYSTOLIC HEART FAILURE:  Mixed ischemic and nonischemic .  EF 20 - 25%.    Switched to Coreg yesterday.  I will increase to 6.25 bid today.   Continue IV  Lasix.  ANEMIA: Per primary team.  NSVT:   No significant episodes last yesterday.  Follow.    CAD:  As above.  Medical management.    Signed, Rollene Rotunda, MD  08/31/2016, 8:37 AM

## 2016-08-31 NOTE — Evaluation (Signed)
Physical Therapy Evaluation/ Discharge Patient Details Name: Derrick Ramos MRN: [id number] DOB: 24-Oct-1945 Today's Date: 08/31/2016   History of Present Illness  71 yo admitted with heart failure, s/p thoracentesis 4/11, cardiac cath 4/13. PMHx: ETOH abuse, cirrhosis  Clinical Impression  Pt with flat affect and reports he is independent living with wife at baseline. Pt reports no falls or difficulty with mobility. Pt currently able to maintain independence with all transfers and gait. Sats maintained at 89-92% on RA with gait with increased saturations with decreased gait speed. Pt with HR 104, no SOB and cues for self monitoring with gait and activity. Pt at baseline functional level without further therapy needs and safe for return home. Will sign off with pt aware and agreeable.     Follow Up Recommendations No PT follow up    Equipment Recommendations  None recommended by PT    Recommendations for Other Services       Precautions / Restrictions Precautions Precautions: None      Mobility  Bed Mobility Overal bed mobility: Independent                Transfers Overall transfer level: Independent                  Ambulation/Gait Ambulation/Gait assistance: Independent Ambulation Distance (Feet): 500 Feet Assistive device: None Gait Pattern/deviations: WFL(Within Functional Limits)   Gait velocity interpretation: at or above normal speed for age/gender General Gait Details: no LOB, pt able to complete head turns and change of speed. Pt with quick gait and with normal speed sats dropped to 89% with gait. With slightly decreased speed pt able to maintain 91% on return to room  Stairs            Wheelchair Mobility    Modified Rankin (Stroke Patients Only)       Balance Overall balance assessment: No apparent balance deficits (not formally assessed)                                           Pertinent Vitals/Pain Pain  Assessment: No/denies pain    Home Living Family/patient expects to be discharged to:: Private residence Living Arrangements: Spouse/significant other Available Help at Discharge: Family;Available 24 hours/day Type of Home: Apartment Home Access: Level entry     Home Layout: One level Home Equipment: None      Prior Function Level of Independence: Independent               Hand Dominance        Extremity/Trunk Assessment   Upper Extremity Assessment Upper Extremity Assessment: Overall WFL for tasks assessed    Lower Extremity Assessment Lower Extremity Assessment: Overall WFL for tasks assessed    Cervical / Trunk Assessment Cervical / Trunk Assessment: Normal  Communication   Communication: No difficulties  Cognition Arousal/Alertness: Awake/alert Behavior During Therapy: Flat affect Overall Cognitive Status: Within Functional Limits for tasks assessed                                        General Comments      Exercises     Assessment/Plan    PT Assessment Patent does not need any further PT services  PT Problem List         PT Treatment  Interventions      PT Goals (Current goals can be found in the Care Plan section)  Acute Rehab PT Goals PT Goal Formulation: All assessment and education complete, DC therapy    Frequency     Barriers to discharge        Co-evaluation               End of Session   Activity Tolerance: Patient tolerated treatment well Patient left: in bed;with call bell/phone within reach Nurse Communication: Mobility status PT Visit Diagnosis: Other abnormalities of gait and mobility (R26.89)    Time: 1478-2956 PT Time Calculation (min) (ACUTE ONLY): 13 min   Charges:   PT Evaluation $PT Eval Low Complexity: 1 Procedure     PT G Codes:        Delaney Meigs, PT [phone]   Mossie Gilder B Woodruff Skirvin 08/31/2016, 1:58 PM

## 2016-08-31 NOTE — Discharge Summary (Signed)
Name: Derrick Ramos MRN: [id number] DOB: 1945/07/20 71 y.o. PCP: Macy Mis, MD  Date of Admission: 08/27/2016  5:35 PM Date of Discharge: 08/31/2016 Attending Physician: Tyson Alias, MD  Discharge Diagnosis:  Principal Problem:   Heart failure with reduced ejection fraction [facility]) Active Problems:   Hepatic cirrhosis (HCC)   Symptomatic anemia   Pleural effusion   Acute on chronic diastolic congestive heart failure (HCC)   CAD in native artery   Discharge Medications: Allergies as of 08/31/2016   No Known Allergies     Medication List    STOP taking these medications   clopidogrel 75 MG tablet Commonly known as:  PLAVIX   metoprolol tartrate 25 MG tablet Commonly known as:  LOPRESSOR     TAKE these medications   aspirin 81 MG EC tablet Take 1 tablet (81 mg total) by mouth daily.   atorvastatin 40 MG tablet Commonly known as:  LIPITOR Take 1 tablet (40 mg total) by mouth daily at 6 PM. What changed:  medication strength  how much to take   carvedilol 6.25 MG tablet Commonly known as:  COREG Take 1 tablet (6.25 mg total) by mouth 2 (two) times daily with a meal.   furosemide 40 MG tablet Commonly known as:  LASIX Take 1 tablet (40 mg total) by mouth daily.   ibuprofen 200 MG tablet Commonly known as:  ADVIL,MOTRIN Take 400 mg by mouth every 6 (six) hours as needed.   iron polysaccharides 150 MG capsule Commonly known as:  NIFEREX Take 1 capsule (150 mg total) by mouth daily. Start taking on:  09/01/2016   nitroGLYCERIN 0.4 MG SL tablet Commonly known as:  NITROSTAT Place 1 tablet (0.4 mg total) under the tongue every 5 (five) minutes as needed for chest pain.   pantoprazole 40 MG tablet Commonly known as:  PROTONIX Take 1 tablet (40 mg total) by mouth 2 (two) times daily.   ramipril 10 MG capsule Commonly known as:  ALTACE TAKE 1 CAPSULE (10 MG TOTAL) BY MOUTH DAILY.   thiamine 100 MG tablet Take 1 tablet (100 mg total) by mouth  daily.       Disposition and follow-up:   Derrick.Derrick Ramos was discharged from [facility] in Stable condition.  At the hospital follow up visit please address:  1.  CHF and Volume Status.  New low EF state, numerous episodes of NSVT seen on telemetry.  Manage diuretics, check for hypokalemia/hypomagnesemia, titrate carvedilol as tolerate by BP and HR, consider initiation of spironolactone, ensure cardiology follow-up.  2.  Symptomatic Anemia.  Check CBC, ask about signs of GI bleeding.  History of gastritis.  GI referral for endoscopy.  Continue iron supplementation.  3.  CAD s/p BMS to LAD.  Admitted on aspirin and plavix, discharged on aspirin 81 mg alone due to bleeding risk.  4.  Urinary Retention.  Ask about urinary symptoms.    5.  Labs / imaging needed at time of follow-up: CBC, BMP, Mg  6.  Pending labs/ test needing follow-up: none  Follow-up Appointments: Follow-up Information    [name], MD. Schedule an appointment as soon as possible for a visit in 2 week(s).   Specialties:  Cardiology, Radiology Contact information: [address] [place] [address] [phone]        Delbert Harness, MD. Schedule an appointment as soon as possible for a visit in 1 week(s).   Specialty:  Family Medicine Why:  Call first thing  on Monday to make appointment ASAP Contact information: [address] [place] [address] [phone]           Hospital Course by problem list: Principal Problem:   Heart failure with reduced ejection fraction [facility]) Active Problems:   Hepatic cirrhosis (HCC)   Symptomatic anemia   Pleural effusion   Acute on chronic diastolic congestive heart failure (HCC)   CAD in native artery   1. Volume Overload due to HFrEF Derrick Ramos was directed to the ED from his cardiologist's office because of 1 month of worsening dyspnea, orthopnea, and reduced exertional capacity, and was found to have a  high risk Myoview stress test and newly reduced EF on echocardiogram as well as new severe TR.  He did not require supplemental oxygen, but CXR showed right greater than left pleural effusion without pulmonary edema, and he had JVD and bilateral pitting edema in his legs.  His symptoms of right-sided heart failure correlate with the new TR on echocardiogram.  He was diuresed with IV furosemide and volume status improved.  Thoracentesis of right pleural space removed 1400 mL of transudative fluid and improved symptoms of dyspnea.  He underwent left and right heart cath which showed diffuse disease, no new obstructive lesions, a patent LAD stent, and mild pulmonary hypertension, and no intervention was performed.  Once he was euvolemic he was discharged with new home furosemide prescription.  2. Symptomatic Anemia At admission he was found to have Hgb 6.6 and transfused 2U pRBCs.  He has a history of gastritis but denied any recent melena, hematochezia, hematemesis, or other bleeding.  His microcytic anemia and low ferritin indicate iron deficiency and he was started on oral iron supplementation.  He had no clinical bleeding during his admission.  His iron deficiency anemia is most likely due to chronic GI bleeding, with risk factors of gastritis seen on endoscopy 11/2015 and alcohol abuse.  He should follow-up with gastroenterology as outpatient for further evaluation and colonoscopy.  3. CAD Bare metal stent to LAD in 11/2015, on DAPT with aspirin and plavix at time of admission.  With his anemia and risk of GI bleeding, antiplatelets were initially held then he was discharged on aspirin 81 mg daily.  Repeat coronary angiography showed patent stent and diffuse, nonobstructive CAD.  4. Urinary Retention Found to have PVR of 550 mL in the ED, in and out catheterization performed.  Repeat PVR after admission showed low bladder volume and he reported no symptoms.  Discharge Vitals:   BP 126/69 (BP Location:  Left Arm)   Pulse 100   Temp 99.6 F (37.6 C) (Oral)   Resp 19   Ht 5\' 6"  (1.676 m)   Wt 129 lb 12.8 oz (58.9 kg)   SpO2 92%   BMI 20.95 kg/m   Pertinent Labs, Studies, and Procedures:   CBC Latest Ref Rng & Units 08/31/2016 08/30/2016 08/30/2016  WBC 4.0 - 10.5 K/uL 8.5 9.0 9.5  Hemoglobin 13.0 - 17.0 g/dL 9.1(Y) 7.8(G) 9.5(A)  Hematocrit 39.0 - 52.0 % 29.6(L) 32.2(L) 31.8(L)  Platelets 150 - 400 K/uL 279 305 322   CMP Latest Ref Rng & Units 08/31/2016 08/30/2016 08/30/2016  Glucose 65 - 99 mg/dL 96 99 94  BUN 6 - 20 mg/dL 8 10 13   Creatinine 0.61 - 1.24 mg/dL 2.13 0.86 5.78(I)  Sodium 135 - 145 mmol/L 137 137 138  Potassium 3.5 - 5.1 mmol/L 4.2 3.6 3.8  Chloride 101 - 111 mmol/L 105 105  104  CO2 22 - 32 mmol/L 23 24 24   Calcium 8.9 - 10.3 mg/dL 7.7(L) 7.8(L) 7.5(L)  Total Protein 6.5 - 8.1 g/dL - - 6.5  Total Bilirubin 0.3 - 1.2 mg/dL - - 1.7(H)  Alkaline Phos 38 - 126 U/L - - 107  AST 15 - 41 U/L - - 48(H)  ALT 17 - 63 U/L - - 41   Component     Latest Ref Rng & Units 08/29/2016 08/29/2016 08/29/2016 08/30/2016         2:14 AM 11:36 AM  4:28 PM   Magnesium     1.7 - 2.4 mg/dL 0.7 (LL) 1.4 (L) 2.5 (H) 2.0   Component     Latest Ref Rng & Units 08/31/2016          Magnesium     1.7 - 2.4 mg/dL 1.6 (L)   Iron/TIBC/Ferritin/ %Sat    Component Value Date/Time   FERRITIN 7 (L) 08/28/2016 0047   Chest Radiographs PA and Lateral 08/27/2016 IMPRESSION: Moderate-sized right pleural effusion. Atelectasis, infiltrate or consolidation in right lower lobe. No pulmonary edema.  Echocardiogram 08/27/2016 Impressions:  - LVEF 20-25%, moderate LVH, global hypokinesis, mild AI (probably   mild aortic stenosis), dilated aorta to 4.1 cm, mild Derrick, moderate   LAE, mild RVE, severe RAE, moderate to severe eccentric TR, RVSP   50 mmHg, bilateral pleural effusions, trivial pericardial   effusion without tamponade features.  Right and Left Heart Catheterization with Coronary  Angiography 08/30/2016  Ost LAD to Prox LAD lesion, 0 %stenosed.  Ost 2nd Diag to 2nd Diag lesion, 75 %stenosed.  Dist LAD lesion, 70 %stenosed.  There is severe left ventricular systolic dysfunction.  LV end diastolic pressure is mildly elevated.  The left ventricular ejection fraction is less than 25% by visual estimate.  IMPRESSION: Derrick. Hupp ostial LAD stent is widely patent. He does have moderate mid LAD stenosis however with recent negative Myoview stress test I did not feel compelled to perform FFR interrogation. He also has a diagonal branch ostial stenosis. I suspect he has worsening of his nonischemic cardiomyopathy. He does have pulmonary hypertension although his LVEDP is only mildly elevated at 20. I suspect this is related to several days of diuresis. I recommend continued aggressive optimal medical therapy for systolic heart failure. I performed a interim of his right common femoral artery and placed a MYNX device for femoral hemostasis. The patient left the lab in stable condition  Discharge Instructions: Discharge Instructions    AMB Referral to Cardiac Rehabilitation - Phase II    Complete by:  As directed    Diagnosis:  Heart Failure (see criteria below if ordering Phase II)   Heart Failure Type:  Chronic Systolic   Diet - low sodium heart healthy    Complete by:  As directed    Increase activity slowly    Complete by:  As directed     You were admitted to the hospital because of shortness of breath.  We found that your heart was not pumping as well as it used to, causing fluid to back up in your lungs.  You blood counts were also low, making you more short of breath.  We gave you a blood transfusion to get your blood counts up, and also drained fluid from around your lung with a needle and gave you medicines to help get off the fluid by urinating.  Please call the offices of Dr. Earnest Bailey and Dr. Allyson Sabal on [place] and schedule  follow-up appointments as soon as possible.   You need to see Dr Earnest Bailey or someone she works with this week.  If you get worsening swelling or shortness of breath, please call your doctor right away.  If you breathing gets very bad, or you have chest pain or pass out, please call 911 and come back to the ED.  Signed: Alm Bustard, MD 08/31/2016, 3:14 PM   Pager: [phone]

## 2016-08-31 NOTE — Progress Notes (Signed)
Internal Medicine Attending:   I saw and examined the patient. I reviewed the resident's note and I agree with the resident's findings and plan as documented in the resident's note.  71 year old man hospital day #5 with acute on chronic heart failure with reduced ejection fraction. He's responded well to IV diuresis. He says that he feels much better, almost back to his baseline. Eating and drinking well. No other complaints. On exam his lungs are sounding well-inflated, still has moderate effusion on the right, small effusion on the left on percussion. He is saturating well on room air in the bed. Please ambulate today with pulse oximetry. Needs PT and OT evaluations. Dr. Antoine Poche with cardiology would like to continue IV diuresis today and increase beta blocker which is fine with me. I think he is getting close to discharge in the next 1-2 days.

## 2016-08-31 NOTE — Progress Notes (Signed)
Subjective: Breathing well, no chest pain or palpitations.  Sleeping well lying flat.  Objective:  Vital signs in last 24 hours: Vitals:   08/30/16 1430 08/30/16 1500 08/30/16 2108 08/31/16 0413  BP: 123/70 105/71 119/73 (!) 141/93  Pulse:   100 100  Resp:   18 18  Temp:   99.7 F (37.6 C) 98.6 F (37 C)  TempSrc:   Oral Oral  SpO2:   100% 97%  Weight:    129 lb 12.8 oz (58.9 kg)  Height:        Intake/Output Summary (Last 24 hours) at 08/31/16 0734 Last data filed at 08/31/16 0414  Gross per 24 hour  Intake              720 ml  Output              626 ml  Net               94 ml   Filed Weights   08/29/16 0258 08/30/16 0519 08/31/16 0413  Weight: 133 lb 1.6 oz (60.4 kg) 134 lb 3.2 oz (60.9 kg) 129 lb 12.8 oz (58.9 kg)   Telemetry: Several episodes of 3 beat runs of NSVT, PVCs  Physical Exam  Constitutional: He is oriented to person, place, and time. He appears well-developed and well-nourished. No distress.  Lying flat in bed asleep, comfortable  Cardiovascular: Normal rate and regular rhythm.   Pulmonary/Chest:  No respiratory distress Reduced breath sounds and crackles R>L base  Musculoskeletal: He exhibits no edema or tenderness.  Neurological: He is alert and oriented to person, place, and time.  Psychiatric: He has a normal mood and affect. His behavior is normal.    CBC Latest Ref Rng & Units 08/31/2016 08/30/2016 08/30/2016  WBC 4.0 - 10.5 K/uL 8.5 9.0 9.5  Hemoglobin 13.0 - 17.0 g/dL 1.6(X) 0.9(U) 0.4(V)  Hematocrit 39.0 - 52.0 % 29.6(L) 32.2(L) 31.8(L)  Platelets 150 - 400 K/uL 279 305 322    CMP Latest Ref Rng & Units 08/31/2016 08/30/2016 08/30/2016  Glucose 65 - 99 mg/dL 96 99 94  BUN 6 - 20 mg/dL Creatinine 0.61 - 1.24 mg/dL 4.09 8.11 9.14(N)  Sodium 135 - 145 mmol/L 137 137 138  Potassium 3.5 - 5.1 mmol/L 4.2 3.6 3.8  Chloride 101 - 111 mmol/L 105 105 104  CO2 22 - 32 mmol/L Calcium 8.9 - 10.3 mg/dL 7.7(L) 7.8(L) 7.5(L)    Total Protein 6.5 - 8.1 g/dL - - 6.5  Total Bilirubin 0.3 - 1.2 mg/dL - - 1.7(H)  Alkaline Phos 38 - 126 U/L - - 107  AST 15 - 41 U/L - - 48(H)  ALT 17 - 63 U/L - - 41   Component     Latest Ref Rng & Units 08/29/2016 08/29/2016 08/29/2016 08/30/2016         2:14 AM 11:36 AM  4:28 PM   Magnesium     1.7 - 2.4 mg/dL 0.7 (LL) 1.4 (L) 2.5 (H) 2.0   Component     Latest Ref Rng & Units 08/31/2016          Magnesium     1.7 - 2.4 mg/dL 1.6 (L)   Assessment/Plan:  Principal Problem:   Heart failure with reduced ejection fraction (HCC) Active Problems:   Hepatic cirrhosis (HCC)   Symptomatic anemia   Pleural effusion   Acute on chronic diastolic congestive heart failure (HCC)   CAD  in native artery   71 y.o. male with CHF, CAD, and alcoholic cirrhosis who presents with dyspnea due to combination of of pleural effusion, decompensated heart failure, and symptomatic anemia.   His systolic function and right heart function have worsened from prior, concerning for progressive ischemic cardiomyopathy.  He is diuresing well and nearing discharge.  #HFrEF #Volume Overload Volume improved, back to baseline weight. EF 20-25% with diffuse hypokinesis, down from 40-45% in 11/2015.  Additionally new tricuspid regurg and RA dilation.  Likely ischemic with known CAD, but also concern for new valvular disease leading to new right sided failure.  Dry weight ~128 lbs.  30s run of VT last night concerning with his structural heart disease. -appreciate cardiology recommendations -continue ramipril -increase carvedilol to 6.25 mg BID per cardiology recs  #CAD High risk nuclear medicine stress yesterday due to systolic dysfunction.  On atorvastatin 20 mg daily.  PCI and BMS to LAD 11/2015.  Trops low and flat, no MI here. -atorvastatin 40 mg daily -resume aspirin 81 mg daily  #Hypokalemia #Hypomagnesemia With loop diuresis. -Follow electrolytes, replete as needed  #Pleural Effusion Improved.  Right  > left effusion, thoracentesis removed 1400 mL transudative fluid on 4/11 from R with symptomatic improvement.  Likely cardiogenic in the absence of ascites, anticipate continued gradual improvement with diuresis.  #Symptomatic Anemia #History of Gastritis Hypoproliferative, microcytic, with very low ferritin indicative of iron deficiency.  Received 2U pRBCs on 4/10 with appropriate increase in Hgb, stable/improving with diuresis since.  He has history of GI bleeding but denies melena, hematochezia, or hematemesis, FOBT negative, no clinical evidence of current bleeding.  EGD in Alcohol abuse and chronic disease may also be contributing, but iron deficiency, likely from chronic GI losses, is most likely. -Follow CBC -Transfuse to >7.0 -PO iron supplementation -Outpatient GI follow-up  #Urinary Retention Resolved.  Low PVR after admission, urinating well.  #Alcohol Abuse No signs of withdrawal. -CIWA monitoring  Fluids: none Diet:  heart/carb DVT Prophylaxis: lovenox Code Status: full  Dispo: Anticipated discharge in approximately 0-1 day(s).   Alm Bustard, MD 08/31/2016, 7:34 AM Pager: [phone]

## 2016-09-10 ENCOUNTER — Telehealth (HOSPITAL_COMMUNITY): Payer: Self-pay | Admitting: Family Medicine

## 2016-09-10 NOTE — Telephone Encounter (Signed)
Verified [employer] Medicare insurance benefits through [employer]. No Coinsurance or Deductible. Copay $20.00 Out of Pocket $4400.00, pt has met $116.58. Reference [phone].... KJ

## 2016-09-13 ENCOUNTER — Encounter: Payer: Self-pay | Admitting: Cardiovascular Disease

## 2016-09-13 ENCOUNTER — Ambulatory Visit (INDEPENDENT_AMBULATORY_CARE_PROVIDER_SITE_OTHER): Payer: Medicare Other | Admitting: Cardiovascular Disease

## 2016-09-13 DIAGNOSIS — I10 Essential (primary) hypertension: Secondary | ICD-10-CM

## 2016-09-13 DIAGNOSIS — F1010 Alcohol abuse, uncomplicated: Secondary | ICD-10-CM

## 2016-09-13 DIAGNOSIS — I70219 Atherosclerosis of native arteries of extremities with intermittent claudication, unspecified extremity: Secondary | ICD-10-CM | POA: Diagnosis not present

## 2016-09-13 DIAGNOSIS — I5020 Unspecified systolic (congestive) heart failure: Secondary | ICD-10-CM

## 2016-09-13 DIAGNOSIS — I480 Paroxysmal atrial fibrillation: Secondary | ICD-10-CM | POA: Diagnosis not present

## 2016-09-13 DIAGNOSIS — Z9861 Coronary angioplasty status: Secondary | ICD-10-CM

## 2016-09-13 DIAGNOSIS — I2510 Atherosclerotic heart disease of native coronary artery without angina pectoris: Secondary | ICD-10-CM

## 2016-09-13 MED ORDER — CARVEDILOL 12.5 MG PO TABS: 12.5000 mg | ORAL_TABLET | Freq: Two times a day (BID) | ORAL | 6 refills | Status: DC

## 2016-09-13 NOTE — Assessment & Plan Note (Signed)
History of continued alcohol abuse despite having a nonischemic cardiomyopathy and cirrhosis recalcitrant to risk factor modification.

## 2016-09-13 NOTE — Progress Notes (Signed)
09/13/2016 [name]   1945-09-27  [id number]  Primary Physician Delbert Harness, MD Primary Cardiologist: Runell Gess MD Roseanne Reno  HPI:  Mr. Bomar is a 71 year old thin and frail-appearing married African-American male father of 4, grandfather greater than 10 grandchildren and is accompanied by his daughter Larita Fife today. I last saw him in the office 08/09/16 . He has a history of treated hypertension and hyperlipidemia. He also has a history of syncope and has had a Holter monitor that has been unrevealing. He was admitted back in July of last year and had a cardiac catheterization performed by Dr. Katrinka Blazing which showed a high-grade calcified proximal LAD lesion which underwent rotational atherectomy and bare metal stenting by Dr. Sanjuana Kava on 11/29/15. His other problems include a long history of tobacco abuse having recently quit in July of last year. His ongoing alcohol abuse with documented cirrhosis. He has PAF which spontaneously converted during that hospitalization. Anticoagulation was not pursued because of his history of alcohol use and cirrhosis. Recently has had increasing episodic shortness of breath. He also complains of claudication. I obtain Doppler studies on him 08/07/16 revealing a right ABI 0.64 and left ABI 0.9. He did have SFA disease bilaterally, and occluded right popliteal artery as well. In addition, I did stop his Brilenta and transition to Plavix hoping this was the cause of his shortness of breath however it did not affect the quality of his breathing at all. A Myoview stress test was performed on 08/27/16 that showed an ejection fraction of 21% but no ischemia. Based on this he was admitted and underwent right left heart cath by myself on 08/30/16 revealing an EF of 25%, mildly elevated filling pressures and a patent proximal LAD stent. He did have a thoracentesis of 1400 mL of fluid from his right lung was diuresed. He does continue to drink  alcohol.  Current Outpatient Prescriptions  Medication Sig Dispense Refill  . aspirin EC 81 MG EC tablet Take 1 tablet (81 mg total) by mouth daily. 30 tablet 0  . atorvastatin (LIPITOR) 40 MG tablet Take 1 tablet (40 mg total) by mouth daily at 6 PM. 30 tablet 0  . carvedilol (COREG) 12.5 MG tablet Take 1 tablet (12.5 mg total) by mouth 2 (two) times daily with a meal. 60 tablet 6  . furosemide (LASIX) 40 MG tablet Take 1 tablet (40 mg total) by mouth daily. 30 tablet 0  . ibuprofen (ADVIL,MOTRIN) 200 MG tablet Take 400 mg by mouth every 6 (six) hours as needed.    . iron polysaccharides (NIFEREX) 150 MG capsule Take 1 capsule (150 mg total) by mouth daily. 30 capsule 0  . nitroGLYCERIN (NITROSTAT) 0.4 MG SL tablet Place 1 tablet (0.4 mg total) under the tongue every 5 (five) minutes as needed for chest pain. 30 tablet 0  . pantoprazole (PROTONIX) 40 MG tablet Take 1 tablet (40 mg total) by mouth 2 (two) times daily. 60 tablet 0  . ramipril (ALTACE) 10 MG capsule TAKE 1 CAPSULE (10 MG TOTAL) BY MOUTH DAILY. 30 capsule 11  . thiamine 100 MG tablet Take 1 tablet (100 mg total) by mouth daily. 30 tablet 0   No current facility-administered medications for this visit.     No Known Allergies  Social History   Social History  . Marital status: Married    Spouse name: N/A  . Number of children: N/A  . Years of education: N/A   Occupational History  .  Not on file.   Social History Main Topics  . Smoking status: Former Smoker    Packs/day: 0.12    Years: 55.00    Types: Cigarettes    Quit date: 11/21/2015  . Smokeless tobacco: Never Used  . Alcohol use 156.6 oz/week    93 Glasses of wine, 168 Cans of beer per week     Comment: 11/21/2015 daily, 2 - 6 pack tall cans daily beer and gallon wine 2-3 days  . Drug use: No  . Sexual activity: No   Other Topics Concern  . Not on file   Social History Narrative  . No narrative on file     Review of Systems: General: negative for  chills, fever, night sweats or weight changes.  Cardiovascular: negative for chest pain, dyspnea on exertion, edema, orthopnea, palpitations, paroxysmal nocturnal dyspnea or shortness of breath Dermatological: negative for rash Respiratory: negative for cough or wheezing Urologic: negative for hematuria Abdominal: negative for nausea, vomiting, diarrhea, bright red blood per rectum, melena, or hematemesis Neurologic: negative for visual changes, syncope, or dizziness All other systems reviewed and are otherwise negative except as noted above.    Blood pressure 136/83, pulse 93, height 5\' 6"  (1.676 m), weight 127 lb 6.4 oz (57.8 kg), SpO2 100 %.  General appearance: alert and no distress Neck: no adenopathy, no carotid bruit, no JVD, supple, symmetrical, trachea midline and thyroid not enlarged, symmetric, no tenderness/mass/nodules Lungs: clear to auscultation bilaterally Heart: regular rate and rhythm, S1, S2 normal, no murmur, click, rub or gallop Extremities: extremities normal, atraumatic, no cyanosis or edema  EKG not performed today  ASSESSMENT AND PLAN:   Atrial fibrillation (HCC) History of paroxysmal atrial fibrillation spontaneously converting to sinus rhythm not to be on oral anticoagulation candidate because of cirrhosis and alcohol abuse.  HTN (hypertension) History of hypertension with blood pressure measured at 136/83. He is on carvedilol and Altace. Continue current meds at current dosing  Alcohol abuse History of continued alcohol abuse despite having a nonischemic cardiomyopathy and cirrhosis recalcitrant to risk factor modification.  CAD -S/P PCI 11/29/15 History of CAD status post proximal LAD rotational atherectomy and bare metal stenting by Dr. Clifton James 11/29/15. I performed repeat cardiac catheterization on him 08/27/16 revealing a patent proximal LAD stent, 70% mid LAD stenosis with an EF of 25%. His LVEDP was 20 and his wedge pressure mean was 12. He was diuresed  and had a right thoracentesis. His medications were adjusted. He feels clinically improved.  Atherosclerotic PVD with intermittent claudication (HCC) History of peripheral arterial disease with lower extremity Dopplers performed 08/07/16 revealing a right ABI 0.64 with high-frequency signal in the mid right SFA and occluded right popliteal artery. He does complain of claudication. At this time, given his nonischemic cardiomyopathy and continued alcohol abuse we will suspend further evaluation.  Heart failure with reduced ejection fraction (HCC) History of nonischemic cardiomyopathy with EF of 25% at cardiac cath recently. He is on optimal medical therapy. His weight is down 17 pounds since admission. He is aware of all restriction. I am going to increase his carvedilol from 6.25 of 12.5 mg twice a day. He will see Belenda Cruise back in one month for follow-up. We will attempt to titrate him up to 25 mg by mouth twice a day. He may be a candidate to initiate Entresto as well.      Runell Gess MD FACP,FACC,FAHA, [facility] 09/13/2016 3:59 PM

## 2016-09-13 NOTE — Assessment & Plan Note (Signed)
History of paroxysmal atrial fibrillation spontaneously converting to sinus rhythm not to be on oral anticoagulation candidate because of cirrhosis and alcohol abuse.

## 2016-09-13 NOTE — Assessment & Plan Note (Signed)
History of peripheral arterial disease with lower extremity Dopplers performed 08/07/16 revealing a right ABI 0.64 with high-frequency signal in the mid right SFA and occluded right popliteal artery. He does complain of claudication. At this time, given his nonischemic cardiomyopathy and continued alcohol abuse we will suspend further evaluation.

## 2016-09-13 NOTE — Patient Instructions (Signed)
Medication Instructions: Increase Carvedilol to 12.5 mg twice daily.   Follow-Up: Your physician recommends that you schedule a follow-up appointment in: 1 month with HTN Clinic for BP and HR Check. Your physician has requested that you regularly monitor and record your blood pressure readings at home. Please use the same machine at the same time of day to check your readings and record them to bring to your follow-up visit. Sherryll Burger*  We request that you follow-up in: 3 months with an extender and in 6 months with Dr San Morelle will receive a reminder letter in the mail two months in advance. If you don't receive a letter, please call our office to schedule the follow-up appointment.  If you need a refill on your cardiac medications before your next appointment, please call your pharmacy.

## 2016-09-13 NOTE — Assessment & Plan Note (Signed)
History of hypertension with blood pressure measured at 136/83. He is on carvedilol and Altace. Continue current meds at current dosing

## 2016-09-13 NOTE — Assessment & Plan Note (Signed)
History of nonischemic cardiomyopathy with EF of 25% at cardiac cath recently. He is on optimal medical therapy. His weight is down 17 pounds since admission. He is aware of all restriction. I am going to increase his carvedilol from 6.25 of 12.5 mg twice a day. He will see Belenda Cruise back in one month for follow-up. We will attempt to titrate him up to 25 mg by mouth twice a day. He may be a candidate to initiate Entresto as well.

## 2016-09-13 NOTE — Assessment & Plan Note (Signed)
History of CAD status post proximal LAD rotational atherectomy and bare metal stenting by Dr. Clifton James 11/29/15. I performed repeat cardiac catheterization on him 08/27/16 revealing a patent proximal LAD stent, 70% mid LAD stenosis with an EF of 25%. His LVEDP was 20 and his wedge pressure mean was 12. He was diuresed and had a right thoracentesis. His medications were adjusted. He feels clinically improved.

## 2016-09-19 ENCOUNTER — Telehealth (HOSPITAL_COMMUNITY): Payer: Self-pay | Admitting: Family Medicine

## 2016-09-26 ENCOUNTER — Other Ambulatory Visit: Payer: Self-pay | Admitting: Internal Medicine

## 2016-09-27 ENCOUNTER — Telehealth (HOSPITAL_COMMUNITY): Payer: Self-pay

## 2016-09-27 NOTE — Telephone Encounter (Incomplete)
s/w pt's daughter, pt is having other issues that may not allow him to do CRP, mailed out brochure per

## 2016-10-04 ENCOUNTER — Telehealth: Payer: Self-pay | Admitting: Cardiovascular Disease

## 2016-10-04 ENCOUNTER — Other Ambulatory Visit: Payer: Self-pay | Admitting: Internal Medicine

## 2016-10-04 MED ORDER — FUROSEMIDE 40 MG PO TABS: 40.0000 mg | ORAL_TABLET | Freq: Every day | ORAL | 5 refills | Status: DC

## 2016-10-04 NOTE — Telephone Encounter (Signed)
New Message   *STAT* If patient is at the pharmacy, call can be transferred to refill team.   1. Which medications need to be refilled? (please list name of each medication and dose if known) furosemide (Lasix) 40 mg tablet once daily  2. Which pharmacy/location (including street and city if local pharmacy) is medication to be sent to? [id number] [address], , [place]  3. Do they need a 30 day or 90 day supply?  30 day supply

## 2016-10-04 NOTE — Telephone Encounter (Signed)
°*  STAT* If patient is at the pharmacy, call can be transferred to refill team.  1. Which medications need to be refilled? (please list name of each medication and dose if known) Atorvastatin 2. Which pharmacy/location (including street and city if local pharmacy) is [phone]  3. Do they need a 30 day or 90 day supply? 90 and refills

## 2016-10-07 ENCOUNTER — Other Ambulatory Visit: Payer: Self-pay | Admitting: Cardiovascular Disease

## 2016-10-07 NOTE — Telephone Encounter (Signed)
Rx request sent to pharmacy.  

## 2016-10-10 ENCOUNTER — Telehealth (HOSPITAL_COMMUNITY): Payer: Self-pay

## 2016-10-17 ENCOUNTER — Encounter (HOSPITAL_COMMUNITY): Payer: Self-pay

## 2016-10-17 ENCOUNTER — Ambulatory Visit: Payer: Medicare Other

## 2016-10-17 NOTE — Progress Notes (Signed)
Mailed letter with our CRP for pt to return our call if interested in our program... KJ

## 2016-10-29 ENCOUNTER — Telehealth: Payer: Self-pay | Admitting: *Deleted

## 2016-10-29 DIAGNOSIS — I255 Ischemic cardiomyopathy: Secondary | ICD-10-CM

## 2016-10-29 DIAGNOSIS — D649 Anemia, unspecified: Secondary | ICD-10-CM

## 2016-10-29 DIAGNOSIS — Z79899 Other long term (current) drug therapy: Secondary | ICD-10-CM

## 2016-10-29 NOTE — Telephone Encounter (Signed)
-----   Message from Tobin Chad, RN sent at 05/29/2016  4:12 PM EST -----  LABS CMP ,CBC,LIPID DUE IN 6 MONTHS 11/2016 MAIL June 2018 PATIENT OF DR Allyson Sabal

## 2016-10-29 NOTE — Telephone Encounter (Signed)
Mail letter labslip

## 2016-10-31 ENCOUNTER — Ambulatory Visit (INDEPENDENT_AMBULATORY_CARE_PROVIDER_SITE_OTHER): Payer: Medicare Other | Admitting: Pharmacist

## 2016-10-31 VITALS — BP 132/70 | HR 76

## 2016-10-31 DIAGNOSIS — Z79899 Other long term (current) drug therapy: Secondary | ICD-10-CM

## 2016-10-31 DIAGNOSIS — I10 Essential (primary) hypertension: Secondary | ICD-10-CM

## 2016-10-31 DIAGNOSIS — I5020 Unspecified systolic (congestive) heart failure: Secondary | ICD-10-CM | POA: Diagnosis not present

## 2016-10-31 MED ORDER — CARVEDILOL 12.5 MG PO TABS: 12.5000 mg | ORAL_TABLET | Freq: Two times a day (BID) | ORAL | 1 refills | Status: DC

## 2016-10-31 NOTE — Patient Instructions (Addendum)
Return for a a follow up appointment in 2 weeks  Your blood pressure today is 132/70 pulse 76  Check your blood pressure at home daily (if able) and keep record of the readings.  Take your BP meds as follows: **RESUME carvedilol 12.5mg  twice daily** Continue all pother medication as prescribed   Bring all of your meds, your BP cuff and your record of home blood pressures to your next appointment.  Exercise as you're able, try to walk approximately 30 minutes per day.  Keep salt intake to a minimum, especially watch canned and prepared boxed foods.  Eat more fresh fruits and vegetables and fewer canned items.  Avoid eating in fast food restaurants.    HOW TO TAKE YOUR BLOOD PRESSURE: . Rest 5 minutes before taking your blood pressure. .  Don't smoke or drink caffeinated beverages for at least 30 minutes before. . Take your blood pressure before (not after) you eat. . Sit comfortably with your back supported and both feet on the floor (don't cross your legs). . Elevate your arm to heart level on a table or a desk. . Use the proper sized cuff. It should fit smoothly and snugly around your bare upper arm. There should be enough room to slip a fingertip under the cuff. The bottom edge of the cuff should be 1 inch above the crease of the elbow. . Ideally, take 3 measurements at one sitting and record the average.

## 2016-10-31 NOTE — Progress Notes (Signed)
Patient ID: Montae Stager                 DOB: 07/05/1945                      MRN: [id number]     HPI: Ziyad Dyar is a 71 y.o. male referred by Dr.  to HTN clinic. PMH includes hypertension, CAD s/p PCI, heart failure with EF on 25% on 08/30/16, alcohol abuse with cirrhosis, and atrial fibrillation. Carvedilol dose was increased from 6.25mg  twice daily to 12.5mg  twice daily during most recent office visit with Dr Allyson Sabal on 09/13/16.    Patient presents to clinic for beta-blocker titration and potential Entresto initiation. Reports he used to drink 3-4 beers per day but stopped drinking since last hospital admission. Denies shortness of breath, dizziness, swelling or increased fatigue. During medication reconciliation was noted Mr Jay is complete out of carvedilol wit last dose taken ~ 3 days ago. Also noted significant confusion in relation to medication knowledge.   Current HTN meds:  Carvedilol 12.5mg  twice daily - out of medication for few days Furosemide 40mg  daily Ramipril 10mg  daily  BP goal: 130/80  Social History: formers smoker; stopped drinking about 2 months ago  Diet: baked food, mainly home cooked meals  Exercise: daily walks for ~20 minutes  Home BP readings: no records available; haven't used his machine in a long time  Wt Readings from Last 3 Encounters:  09/13/16 127 lb 6.4 oz (57.8 kg)  08/31/16 129 lb 12.8 oz (58.9 kg)  08/27/16 144 lb (65.3 kg)   BP Readings from Last 3 Encounters:  10/31/16 132/70  09/13/16 136/83  08/31/16 126/69   Pulse Readings from Last 3 Encounters:  10/31/16 76  09/13/16 93  08/31/16 100    Renal function: CrCl cannot be calculated (Patient's most recent lab result is older than the maximum 21 days allowed.).  Past Medical History:  Diagnosis Date  . Acute kidney injury (HCC)    [name] 11/21/2015  . Atrial fibrillation (HCC)    [name] 11/21/2015  . Dyspnea   . GERD (gastroesophageal reflux disease)   . GI bleed 2016   [name] 11/21/2015  . H/O syncope    recurrent episodes/notes 11/21/2015  . Heart failure (HCC) 08/2016  . Hematemesis    [name] 11/21/2015  . Hepatic cirrhosis (HCC)    [name] 11/21/2015  . History of blood transfusion 2016   related to GI bleed/notes 11/21/2015  . Hypertension   . PVD (peripheral vascular disease) (HCC)    [name] 11/21/2015  . Symptomatic anemia 08/2016  . Syncope and collapse    required staples to back of head/notes 11/21/2015    Current Outpatient Prescriptions on File Prior to Visit  Medication Sig Dispense Refill  . aspirin EC 81 MG EC tablet Take 1 tablet (81 mg total) by mouth daily. 30 tablet 0  . atorvastatin (LIPITOR) 40 MG tablet TAKE 1 TABLET (40 MG TOTAL) BY MOUTH DAILY AT 6 PM. 90 tablet 1  . furosemide (LASIX) 40 MG tablet Take 1 tablet (40 mg total) by mouth daily. 30 tablet 5  . ibuprofen (ADVIL,MOTRIN) 200 MG tablet Take 400 mg by mouth every 6 (six) hours as needed.    . iron polysaccharides (NIFEREX) 150 MG capsule Take 1 capsule (150 mg total) by mouth daily. 30 capsule 0  . nitroGLYCERIN (NITROSTAT) 0.4 MG SL tablet Place 1 tablet (0.4 mg total) under the tongue every 5 (five) minutes as needed  for chest pain. 30 tablet 0  . pantoprazole (PROTONIX) 40 MG tablet Take 1 tablet (40 mg total) by mouth 2 (two) times daily. 60 tablet 0  . ramipril (ALTACE) 10 MG capsule TAKE 1 CAPSULE (10 MG TOTAL) BY MOUTH DAILY. 30 capsule 11  . thiamine 100 MG tablet Take 1 tablet (100 mg total) by mouth daily. 30 tablet 0   No current facility-administered medications on file prior to visit.     No Known Allergies  Blood pressure 132/70, pulse 76.  Hypertension/Beta-blocker titration:  Blood pressure at goal but patient had been off carvedilol for >48 hours. Unknown effect of carvedilol in BP and HR; therefore, I am unable to titrate titration nor initiate Entresto titration today.  Will resume carvedilol 12.5mg  twice daily and follow up in 2 weeks for further titration.  Patient was instructed to bring all medication to next appointment, bring hoe BP device , and bring records of home BP readings. Rx was sent to [facility] pharmacy today.  Shaden Lacher Rodriguez-Guzman PharmD, BCPS [facility] [address] [place] [address] 11/01/2016 11:43 AM

## 2016-11-21 ENCOUNTER — Ambulatory Visit (INDEPENDENT_AMBULATORY_CARE_PROVIDER_SITE_OTHER): Payer: Medicare Other | Admitting: Pharmacist

## 2016-11-21 DIAGNOSIS — I5020 Unspecified systolic (congestive) heart failure: Secondary | ICD-10-CM

## 2016-11-21 MED ORDER — SACUBITRIL-VALSARTAN 49-51 MG PO TABS: 1.0000 | ORAL_TABLET | Freq: Two times a day (BID) | ORAL | Status: DC

## 2016-11-21 NOTE — Patient Instructions (Addendum)
Return for a follow up appointment in 3 weesk with Derrick Ramos  Your blood pressure today is 108/62 pulse 80  Check your blood pressure at home daily (if able) and keep record of the readings.  Take your BP meds as follows: **STOP taking ramipril 10mg ** **Start taking Entresto 49/51mg  twice daily - 1st dose around 9pm on July/10/2016* **Continue all other medication as prescribed** **Repeat blood work on 7/25 at 3:30pm**  Bring all of your meds, your BP cuff and your record of home blood pressures to your next appointment.  Exercise as you're able, try to walk approximately 30 minutes per day.  Keep salt intake to a minimum, especially watch canned and prepared boxed foods.  Eat more fresh fruits and vegetables and fewer canned items.  Avoid eating in fast food restaurants.    HOW TO TAKE YOUR BLOOD PRESSURE: . Rest 5 minutes before taking your blood pressure. .  Don't smoke or drink caffeinated beverages for at least 30 minutes before. . Take your blood pressure before (not after) you eat. . Sit comfortably with your back supported and both feet on the floor (don't cross your legs). . Elevate your arm to heart level on a table or a desk. . Use the proper sized cuff. It should fit smoothly and snugly around your bare upper arm. There should be enough room to slip a fingertip under the cuff. The bottom edge of the cuff should be 1 inch above the crease of the elbow. . Ideally, take 3 measurements at one sitting and record the average.

## 2016-11-21 NOTE — Progress Notes (Signed)
Patient ID: Charleston Lansdown                 DOB: [birth year]                      MRN: [id number]     HPI: Jaquavius Bierl is a 71 y.o. male referred by Dr. Allyson Sabal to HTN clinic. PMH includes hypertension, CAD s/p PCI, heart failure with EF on 25% on 08/30/16, alcohol abuse with cirrhosis, and atrial fibrillation.  Patient reports he is back drinking about 2 beers per daily.  During medication reconciliation completed in office on 10/31/2016, was noted Mr Belschner was completly out of carvedilol for few days prior to appointment. Also noted significant confusion in relation to medication knowledge; therefore, no medication changes made during office visit.   Patient presents to pharmacist clinic accompanied by his wife for beta-blocker titration and potential Entresto initiation.  Denies dizziness, headaches, chest pain or fatigue.   Current HTN meds:  Carvedilol 12.5mg  twice daily Furosemide 40mg  daily Ramipril 10mg  daily  BP goal: 130/80  Social History: formers smoker; drinking 2 beers every day.   Diet: baked food, mainly home cooked meals  Exercise: daily walks for ~20 minutes  Home BP readings: 19 readings; average 136/78 (pulse 68-90bpm)  Wt Readings from Last 3 Encounters:  09/13/16 127 lb 6.4 oz (57.8 kg)  08/31/16 129 lb 12.8 oz (58.9 kg)  08/27/16 144 lb (65.3 kg)   BP Readings from Last 3 Encounters:  11/21/16 108/62  10/31/16 132/70  09/13/16 136/83   Pulse Readings from Last 3 Encounters:  11/21/16 80  10/31/16 76  09/13/16 93    Past Medical History:  Diagnosis Date  . Acute kidney injury (HCC)    [name] 11/21/2015  . Atrial fibrillation (HCC)    [name] 11/21/2015  . Dyspnea   . GERD (gastroesophageal reflux disease)   . GI bleed 2016   [name] 11/21/2015  . H/O syncope    recurrent episodes/notes 11/21/2015  . Heart failure (HCC) 08/2016  . Hematemesis    [name] 11/21/2015  . Hepatic cirrhosis (HCC)    [name] 11/21/2015  . History of blood transfusion 2016   related to GI bleed/notes 11/21/2015  . Hypertension   . PVD (peripheral vascular disease) (HCC)    [name] 11/21/2015  . Symptomatic anemia 08/2016  . Syncope and collapse    required staples to back of head/notes 11/21/2015    Current Outpatient Prescriptions on File Prior to Visit  Medication Sig Dispense Refill  . aspirin EC 81 MG EC tablet Take 1 tablet (81 mg total) by mouth daily. 30 tablet 0  . atorvastatin (LIPITOR) 40 MG tablet TAKE 1 TABLET (40 MG TOTAL) BY MOUTH DAILY AT 6 PM. 90 tablet 1  . carvedilol (COREG) 12.5 MG tablet Take 1 tablet (12.5 mg total) by mouth 2 (two) times daily with a meal. 60 tablet 1  . furosemide (LASIX) 40 MG tablet Take 1 tablet (40 mg total) by mouth daily. 30 tablet 5  . iron polysaccharides (NIFEREX) 150 MG capsule Take 1 capsule (150 mg total) by mouth daily. 30 capsule 0  . nitroGLYCERIN (NITROSTAT) 0.4 MG SL tablet Place 1 tablet (0.4 mg total) under the tongue every 5 (five) minutes as needed for chest pain. 30 tablet 0  . pantoprazole (PROTONIX) 40 MG tablet Take 1 tablet (40 mg total) by mouth 2 (two) times daily. 60 tablet 0  . thiamine 100 MG tablet Take 1 tablet (100  mg total) by mouth daily. 30 tablet 0   No current facility-administered medications on file prior to visit.     No Known Allergies  Blood pressure 108/62, pulse 80.  Heart failure with reduced ejection fraction (HCC) Blood pressure today remains low normal after Carvedilol 12.5mg  twice daily was resumed 3 weeks ago. Patient denies  problems or adverse reaction to current therapy. Noted few home  BP readings between  98 -103 systolic, but no symptomatic hypotension reported. Will discontinue ramipril  today and start Entresto 49/51mg  twice daily after 36 hours wash off period. Samples were provided to assess tolerability with plan to continue titration during next f/u visit on July/25.   Shyia Fillingim Rodriguez-Guzman PharmD, BCPS [facility] [address] [place] [id number] 11/21/2016 9:04 PM

## 2016-11-21 NOTE — Assessment & Plan Note (Signed)
Blood pressure today remains low normal after Carvedilol 12.5mg  twice daily was resumed 3 weeks ago. Patient denies  problems or adverse reaction to current therapy. Noted few home  BP readings between  98 -103 systolic, but no symptomatic hypotension reported. Will discontinue ramipril 10mg  today and start Entresto 49/51mg  twice daily after 36 hours wash off period. Samples were provided to assess tolerability with plan to continue titration during next f/u visit on July/25.

## 2016-12-11 ENCOUNTER — Ambulatory Visit: Payer: Medicare Other | Admitting: Physician Assistant

## 2016-12-18 ENCOUNTER — Ambulatory Visit (INDEPENDENT_AMBULATORY_CARE_PROVIDER_SITE_OTHER): Payer: Medicare Other | Admitting: Physician Assistant

## 2016-12-18 VITALS — BP 148/80 | HR 73 | Ht 66.0 in | Wt 124.0 lb

## 2016-12-18 DIAGNOSIS — I5022 Chronic systolic (congestive) heart failure: Secondary | ICD-10-CM

## 2016-12-18 DIAGNOSIS — I10 Essential (primary) hypertension: Secondary | ICD-10-CM

## 2016-12-18 MED ORDER — FUROSEMIDE 20 MG PO TABS: ORAL_TABLET | ORAL | 3 refills | Status: DC

## 2016-12-18 MED ORDER — NITROGLYCERIN 0.4 MG SL SUBL: 0.4000 mg | SUBLINGUAL_TABLET | SUBLINGUAL | 3 refills | Status: AC | PRN

## 2016-12-18 NOTE — Patient Instructions (Addendum)
Medication Instructions:  Your physician has recommended you make the following change in your medication:  1.  REDUCE the Lasix to 20 mg daily, IT IS OK TO TAKE AN EXTRA TABLET DAILY IF NEEDED FOR LEG SWELLING  REMEMBER TO TAKE THE ENTRESTO 2 X'S A DAY  Labwork: None ordered  Testing/Procedures: None ordered  Follow-Up: Your physician recommends that you schedule a follow-up appointment in: 2 MONTHS WITH DR. Allyson Sabal   Any Other Special Instructions Will Be Listed Below (If Applicable). 1.  WE DIDN'T GET AN ANSWER AT THE PHARMACY RE: COPAY FOR ENTRESTO.  WE WILL CALL YOU WHEN WE FIND OUT SOMETHING AND MAKE CHANGES, IF NEEDED, FROM THERE.   If you need a refill on your cardiac medications before your next appointment, please call your pharmacy.

## 2016-12-18 NOTE — Progress Notes (Signed)
Cardiology Office Note   Date:  12/18/2016   ID:  Derrick Ramos, DOB 08/06/1945, MRN [id number]  PCP:  Macy Mis, MD  Cardiologist:  Dr William Hamburger, PA-C   History of Present Illness: Derrick Ramos is a 71 y.o. male with a history of HTN, CAD s/p PCI, heart failure with EF on 25% on 08/30/16, alcohol abuse with cirrhosis, and atrial fibrillation, anemia   11/21/2016, Pt seen in HTN clinic, ramipril d/c'd and Entresto started  Derrick Ramos presents for cardiology follow up.  He eats poorly, nibbles during the day, rarely eats protein. He eats ice all day.  His legs have not been swelling. No orthopnea or PND. He does not weigh daily, does not have scales. He has not had a drink in 2 weeks.   He feels his breathing is good. He does all the cooking. He does not feel limited by his breathing in what he can do. He had not had any chest pain at rest or with exertion. No presyncope. No palpitations or dizziness.  He is working at being compliant with his medications.   He is not having any blood loss of which he is aware.  He knows that he has been taking the [facility], thinks he has been taking it twice a day. He doesn't think he will be able to afford it.   Past Medical History:  Diagnosis Date  . Acute kidney injury (HCC)    Derrick Ramos 11/21/2015  . Atrial fibrillation (HCC)    Derrick Ramos 11/21/2015  . Dyspnea   . GERD (gastroesophageal reflux disease)   . GI bleed 2016   Derrick Ramos 11/21/2015  . H/O syncope    recurrent episodes/notes 11/21/2015  . Heart failure (HCC) 08/2016  . Hematemesis    Derrick Ramos 11/21/2015  . Hepatic cirrhosis (HCC)    Derrick Ramos 11/21/2015  . History of blood transfusion 2016   related to GI bleed/notes 11/21/2015  . Hypertension   . PVD (peripheral vascular disease) (HCC)    Derrick Ramos 11/21/2015  . Symptomatic anemia 08/2016  . Syncope and collapse    required staples to back of head/notes 11/21/2015    Past Surgical History:  Procedure Laterality Date  .  CARDIAC CATHETERIZATION N/A 11/24/2015   Procedure: Left Heart Cath and Coronary Angiography;  Surgeon: Lyn Records, MD;  Location: [facility] INVASIVE CV LAB;  Service: Cardiovascular;  Laterality: N/A;  . CARDIAC CATHETERIZATION N/A 11/29/2015   Procedure: Coronary Stent Intervention ;  Surgeon: Kathleene Hazel, MD;  Location: [facility] INVASIVE CV LAB;  Service: Cardiovascular;  Laterality: N/A;  . COLONOSCOPY    . ESOPHAGOGASTRODUODENOSCOPY    . ESOPHAGOGASTRODUODENOSCOPY (EGD) WITH PROPOFOL N/A 11/27/2015   Procedure: ESOPHAGOGASTRODUODENOSCOPY (EGD) WITH PROPOFOL;  Surgeon: Willis Modena, MD;  Location: [facility] ENDOSCOPY;  Service: Endoscopy;  Laterality: N/A;  . RIGHT/LEFT HEART CATH AND CORONARY ANGIOGRAPHY N/A 08/30/2016   Procedure: Right/Left Heart Cath and Coronary Angiography;  Surgeon: Runell Gess, MD;  Location: [facility] INVASIVE CV LAB;  Service: Cardiovascular;  Laterality: N/A;    Current Outpatient Prescriptions  Medication Sig Dispense Refill  . aspirin EC 81 MG EC tablet Take 1 tablet (81 mg total) by mouth daily. 30 tablet 0  . atorvastatin (LIPITOR) 40 MG tablet TAKE 1 TABLET (40 MG TOTAL) BY MOUTH DAILY AT 6 PM. 90 tablet 1  . carvedilol (COREG) 12.5 MG tablet Take 1 tablet (12.5 mg total) by mouth 2 (two) times daily with a meal. 60 tablet 1  .  furosemide (LASIX) 40 MG tablet Take 1 tablet (40 mg total) by mouth daily. 30 tablet 5  . iron polysaccharides (NIFEREX) 150 MG capsule Take 1 capsule (150 mg total) by mouth daily. 30 capsule 0  . nitroGLYCERIN (NITROSTAT) 0.4 MG SL tablet Place 1 tablet (0.4 mg total) under the tongue every 5 (five) minutes as needed for chest pain. 30 tablet 0  . pantoprazole (PROTONIX) 40 MG tablet Take 1 tablet (40 mg total) by mouth 2 (two) times daily. 60 tablet 0  . sacubitril-valsartan (ENTRESTO) 49-51 MG Take 1 tablet by mouth 2 (two) times daily. 60 tablet   . thiamine (VITAMIN B-1) 100 MG tablet Take 100 mg by mouth daily.     No current  facility-administered medications for this visit.     Allergies:   Patient has no known allergies.    Social History:  The patient  reports that he quit smoking about 12 months ago. His smoking use included Cigarettes. He has a 6.60 pack-year smoking history. He has never used smokeless tobacco. He reports that he drinks about 156.6 oz of alcohol per week . He reports that he does not use drugs.   Family History:  The patient's family history includes Cancer in his brother and mother; Chronic Renal Failure in his sister; Diabetes in his sister.    ROS:  Please see the history of present illness. All other systems are reviewed and negative.    PHYSICAL EXAM: VS:  BP (!) 148/80   Pulse 73   Ht 5\' 6"  (1.676 m)   Wt 124 lb (56.2 kg)   BMI 20.01 kg/m  , BMI Body mass index is 20.01 kg/m. GEN: Well nourished, well developed, male in no acute distress  HEENT: normal for age  Neck: minimal JVD, no carotid bruit, no masses Cardiac: RRR; no murmur, no rubs, or gallops Respiratory:  clear to auscultation bilaterally, normal work of breathing GI: soft, nontender, nondistended, + BS MS: no deformity or atrophy; no edema; distal pulses are 2+ in all 4 extremities   Skin: warm and dry, no rash Neuro:  Strength and sensation are intact Psych: euthymic mood, full affect   EKG:  EKG is not ordered today.   Recent Labs: 08/27/2016: B Natriuretic Peptide 2,433.0 08/30/2016: ALT 41 08/31/2016: BUN 8; Creatinine, Ser 1.08; Hemoglobin 8.8; Magnesium 1.6; Platelets 279; Potassium 4.2; Sodium 137    Lipid Panel    Component Value Date/Time   CHOL 117 11/28/2015 1106   TRIG 73 11/28/2015 1106   HDL 42 11/28/2015 1106   CHOLHDL 2.8 11/28/2015 1106   VLDL 15 11/28/2015 1106   LDLCALC 60 11/28/2015 1106     Wt Readings from Last 3 Encounters:  12/18/16 124 lb (56.2 kg)  09/13/16 127 lb 6.4 oz (57.8 kg)  08/31/16 129 lb 12.8 oz (58.9 kg)     Other studies Reviewed: Additional studies/  records that were reviewed today include: Office notes, hospital records and testing.  ASSESSMENT AND PLAN:  1.  Chronic systolic CHF: His weight is down 3 pounds. I advised him that he did not need to lose any more weight and he needed to make sure that he had adequate by mouth intake, including protein. We will reduce his Lasix to 20 mg daily. He can take an extra tablet when necessary for edema. He is encouraged to remember to take the Entresto twice a day.  Raquel Rodriguez-Guzman PharmD, BCPS was consulted regarding the cost of Entresto to the patient. A  prescription was sent to his pharmacy and she will follow-up on this. He is to come back and see her for med titration and analysis of the cost of Entresto. Recommend that she get weights on the CHF patients that she sees.  2. Hypertension: His blood pressure was slightly elevated, but it was improved on a recheck. Continue to follow this.   Current medicines are reviewed at length with the patient today.  The patient has concerns regarding medicines. Concerns were addressed  The following changes have been made:  Decrease Lasix  Labs/ tests ordered today include:  No orders of the defined types were placed in this encounter.    Disposition:   FU with Dr Allyson Sabal  Signed, Leanna Battles  12/18/2016 6:47 PM    [facility] Phone: [phone]; Fax: [phone]  This note was written with the assistance of speech recognition software. Please excuse any transcriptional errors.

## 2016-12-19 ENCOUNTER — Telehealth: Payer: Self-pay | Admitting: Pharmacist

## 2016-12-19 DIAGNOSIS — I2510 Atherosclerotic heart disease of native coronary artery without angina pectoris: Secondary | ICD-10-CM

## 2016-12-19 DIAGNOSIS — I5020 Unspecified systolic (congestive) heart failure: Secondary | ICD-10-CM

## 2016-12-19 DIAGNOSIS — I10 Essential (primary) hypertension: Secondary | ICD-10-CM

## 2016-12-19 NOTE — Telephone Encounter (Signed)
Entresto 49-51 ; 30 day supply has a co-pay of $8.35 per month for this patient - verified with [facility] pharmacy  [facility]

## 2016-12-19 NOTE — Telephone Encounter (Signed)
Patient received update on Entresto co-pay and he is able to afford medication at this time.   F/U appointment with pharmacist clinic given for 02/05/2017 to monitor BP, titrate BB as needed, and repeat blood work (all order on file already).  Multiple reminder given to patient to repeat BMET,and lipid panel but blood work not done yet.

## 2016-12-20 ENCOUNTER — Telehealth: Payer: Self-pay | Admitting: Cardiovascular Disease

## 2016-12-20 MED ORDER — SACUBITRIL-VALSARTAN 49-51 MG PO TABS: 1.0000 | ORAL_TABLET | Freq: Two times a day (BID) | ORAL | 5 refills | Status: DC

## 2016-12-20 NOTE — Telephone Encounter (Signed)
Returning your call. She said she is having problem with her phone,when you call stay on the phone for awhile.She will answer,it just takes awhile before it come through.

## 2016-12-20 NOTE — Telephone Encounter (Signed)
New message  Pt wife call requesting to speak with RN. Pt wife states pt medication that was sent to the pharmacy was incorrect. Please call back to discuss

## 2016-12-20 NOTE — Addendum Note (Signed)
Addended by: Alyson Ingles on: 12/20/2016 12:22 PM   Modules accepted: Orders

## 2016-12-20 NOTE — Telephone Encounter (Signed)
Spoke with EC-wife she states that refill should have been sent yesterday for Entresto  Refill sent to [facility]

## 2016-12-20 NOTE — Telephone Encounter (Signed)
Tried to call pt vm not set up

## 2016-12-20 NOTE — Telephone Encounter (Signed)
DUPLICATE MESSAGE

## 2017-01-17 ENCOUNTER — Observation Stay (HOSPITAL_COMMUNITY): Payer: Medicare Other

## 2017-01-17 ENCOUNTER — Inpatient Hospital Stay (HOSPITAL_COMMUNITY)
Admission: EM | Admit: 2017-01-17 | Discharge: 2017-01-19 | DRG: 683 | Disposition: A | Discharge status: Home / Self Care | Payer: Medicare Other | Attending: Internal Medicine | Admitting: Internal Medicine

## 2017-01-17 ENCOUNTER — Encounter (HOSPITAL_COMMUNITY): Payer: Self-pay | Admitting: *Deleted

## 2017-01-17 ENCOUNTER — Emergency Department (HOSPITAL_COMMUNITY): Payer: Medicare Other

## 2017-01-17 DIAGNOSIS — E860 Dehydration: Secondary | ICD-10-CM | POA: Diagnosis present

## 2017-01-17 DIAGNOSIS — Z79899 Other long term (current) drug therapy: Secondary | ICD-10-CM

## 2017-01-17 DIAGNOSIS — I959 Hypotension, unspecified: Secondary | ICD-10-CM | POA: Diagnosis present

## 2017-01-17 DIAGNOSIS — N19 Unspecified kidney failure: Secondary | ICD-10-CM | POA: Diagnosis present

## 2017-01-17 DIAGNOSIS — Z87891 Personal history of nicotine dependence: Secondary | ICD-10-CM

## 2017-01-17 DIAGNOSIS — D649 Anemia, unspecified: Secondary | ICD-10-CM | POA: Diagnosis present

## 2017-01-17 DIAGNOSIS — E46 Unspecified protein-calorie malnutrition: Secondary | ICD-10-CM | POA: Diagnosis present

## 2017-01-17 DIAGNOSIS — Z681 Body mass index (BMI) 19 or less, adult: Secondary | ICD-10-CM

## 2017-01-17 DIAGNOSIS — Z23 Encounter for immunization: Secondary | ICD-10-CM

## 2017-01-17 DIAGNOSIS — I2510 Atherosclerotic heart disease of native coronary artery without angina pectoris: Secondary | ICD-10-CM | POA: Diagnosis present

## 2017-01-17 DIAGNOSIS — I5022 Chronic systolic (congestive) heart failure: Secondary | ICD-10-CM | POA: Diagnosis present

## 2017-01-17 DIAGNOSIS — N179 Acute kidney failure, unspecified: Secondary | ICD-10-CM

## 2017-01-17 DIAGNOSIS — R3129 Other microscopic hematuria: Secondary | ICD-10-CM | POA: Diagnosis present

## 2017-01-17 DIAGNOSIS — I739 Peripheral vascular disease, unspecified: Secondary | ICD-10-CM | POA: Diagnosis present

## 2017-01-17 DIAGNOSIS — I480 Paroxysmal atrial fibrillation: Secondary | ICD-10-CM | POA: Diagnosis present

## 2017-01-17 DIAGNOSIS — Z77090 Contact with and (suspected) exposure to asbestos: Secondary | ICD-10-CM | POA: Diagnosis present

## 2017-01-17 DIAGNOSIS — I110 Hypertensive heart disease with heart failure: Secondary | ICD-10-CM | POA: Diagnosis present

## 2017-01-17 DIAGNOSIS — K7460 Unspecified cirrhosis of liver: Secondary | ICD-10-CM | POA: Diagnosis present

## 2017-01-17 DIAGNOSIS — K219 Gastro-esophageal reflux disease without esophagitis: Secondary | ICD-10-CM | POA: Diagnosis present

## 2017-01-17 DIAGNOSIS — Z955 Presence of coronary angioplasty implant and graft: Secondary | ICD-10-CM

## 2017-01-17 DIAGNOSIS — T465X5A Adverse effect of other antihypertensive drugs, initial encounter: Secondary | ICD-10-CM | POA: Diagnosis present

## 2017-01-17 DIAGNOSIS — Z7982 Long term (current) use of aspirin: Secondary | ICD-10-CM

## 2017-01-17 DIAGNOSIS — Z8719 Personal history of other diseases of the digestive system: Secondary | ICD-10-CM

## 2017-01-17 DIAGNOSIS — Z66 Do not resuscitate: Secondary | ICD-10-CM | POA: Diagnosis present

## 2017-01-17 LAB — BASIC METABOLIC PANEL
ANION GAP: 14 (ref 5–15)
BUN: 112 mg/dL — ABNORMAL HIGH (ref 6–20)
CHLORIDE: 108 mmol/L (ref 101–111)
CO2: 14 mmol/L — AB (ref 22–32)
Calcium: 7.6 mg/dL — ABNORMAL LOW (ref 8.9–10.3)
Creatinine, Ser: 6.33 mg/dL — ABNORMAL HIGH (ref 0.61–1.24)
GFR calc Af Amer: 9 mL/min — ABNORMAL LOW (ref >60)
GFR calc non Af Amer: 8 mL/min — ABNORMAL LOW (ref >60)
GLUCOSE: 108 mg/dL — AB (ref 65–99)
POTASSIUM: 4.9 mmol/L (ref 3.5–5.1)
Sodium: 136 mmol/L (ref 135–145)

## 2017-01-17 LAB — CBC
HEMATOCRIT: 24.2 % — AB (ref 39.0–52.0)
HEMOGLOBIN: 7.8 g/dL — AB (ref 13.0–17.0)
MCH: 28.7 pg (ref 26.0–34.0)
MCHC: 32.2 g/dL (ref 30.0–36.0)
MCV: 89 fL (ref 78.0–100.0)
Platelets: 145 10*3/uL — ABNORMAL LOW (ref 150–400)
RBC: 2.72 MIL/uL — ABNORMAL LOW (ref 4.22–5.81)
RDW: 17.3 % — ABNORMAL HIGH (ref 11.5–15.5)
WBC: 6 10*3/uL (ref 4.0–10.5)

## 2017-01-17 LAB — I-STAT CHEM 8, ED
BUN: 112 mg/dL — AB (ref 6–20)
CALCIUM ION: 1.04 mmol/L — AB (ref 1.15–1.40)
Chloride: 107 mmol/L (ref 101–111)
Creatinine, Ser: 6.2 mg/dL — ABNORMAL HIGH (ref 0.61–1.24)
GLUCOSE: 95 mg/dL (ref 65–99)
HCT: 27 % — ABNORMAL LOW (ref 39.0–52.0)
Hemoglobin: 9.2 g/dL — ABNORMAL LOW (ref 13.0–17.0)
Potassium: 4.7 mmol/L (ref 3.5–5.1)
SODIUM: 137 mmol/L (ref 135–145)
TCO2: 15 mmol/L — AB (ref 22–32)

## 2017-01-17 LAB — URINALYSIS, ROUTINE W REFLEX MICROSCOPIC
BILIRUBIN URINE: NEGATIVE
GLUCOSE, UA: NEGATIVE mg/dL
KETONES UR: NEGATIVE mg/dL
Leukocytes, UA: NEGATIVE
Nitrite: NEGATIVE
PH: 5 (ref 5.0–8.0)
PROTEIN: NEGATIVE mg/dL
Specific Gravity, Urine: 1.009 (ref 1.005–1.030)

## 2017-01-17 LAB — I-STAT TROPONIN, ED: TROPONIN I, POC: 0.01 ng/mL (ref 0.00–0.08)

## 2017-01-17 LAB — POC OCCULT BLOOD, ED: FECAL OCCULT BLD: NEGATIVE

## 2017-01-17 MED ORDER — VITAMIN B-1 100 MG PO TABS
100.0000 mg | ORAL_TABLET | Freq: Every day | ORAL | Status: DC
Start: 2017-01-18 — End: 2017-01-19
  Administered 2017-01-18 – 2017-01-19 (×2): 100 mg via ORAL
  Filled 2017-01-17 (×2): qty 1

## 2017-01-17 MED ORDER — PANTOPRAZOLE SODIUM 40 MG PO TBEC
40.0000 mg | DELAYED_RELEASE_TABLET | Freq: Two times a day (BID) | ORAL | Status: DC
  Administered 2017-01-17 – 2017-01-19 (×4): 40 mg via ORAL
  Filled 2017-01-17 (×4): qty 1

## 2017-01-17 MED ORDER — ACETAMINOPHEN 325 MG PO TABS: 650.0000 mg | ORAL_TABLET | Freq: Four times a day (QID) | ORAL | Status: DC | PRN

## 2017-01-17 MED ORDER — PNEUMOCOCCAL VAC POLYVALENT 25 MCG/0.5ML IJ INJ
0.5000 mL | INJECTION | INTRAMUSCULAR | Status: AC
  Administered 2017-01-18: 0.5 mL via INTRAMUSCULAR
  Filled 2017-01-17: qty 0.5

## 2017-01-17 MED ORDER — SENNOSIDES-DOCUSATE SODIUM 8.6-50 MG PO TABS: 1.0000 | ORAL_TABLET | Freq: Every evening | ORAL | Status: DC | PRN

## 2017-01-17 MED ORDER — ATORVASTATIN CALCIUM 40 MG PO TABS
40.0000 mg | ORAL_TABLET | Freq: Every day | ORAL | Status: DC
  Administered 2017-01-17 – 2017-01-18 (×2): 40 mg via ORAL
  Filled 2017-01-17 (×2): qty 1

## 2017-01-17 MED ORDER — ASPIRIN EC 81 MG PO TBEC
81.0000 mg | DELAYED_RELEASE_TABLET | Freq: Every day | ORAL | Status: DC
  Administered 2017-01-18 – 2017-01-19 (×2): 81 mg via ORAL
  Filled 2017-01-17 (×2): qty 1

## 2017-01-17 MED ORDER — POLYSACCHARIDE IRON COMPLEX 150 MG PO CAPS
150.0000 mg | ORAL_CAPSULE | Freq: Every day | ORAL | Status: DC
  Administered 2017-01-18 – 2017-01-19 (×2): 150 mg via ORAL
  Filled 2017-01-17 (×2): qty 1

## 2017-01-17 MED ORDER — ACETAMINOPHEN 650 MG RE SUPP: 650.0000 mg | Freq: Four times a day (QID) | RECTAL | Status: DC | PRN

## 2017-01-17 MED ORDER — SODIUM CHLORIDE 0.9 % IV BOLUS (SEPSIS)
1000.0000 mL | Freq: Once | INTRAVENOUS | Status: AC
  Administered 2017-01-17: 1000 mL via INTRAVENOUS

## 2017-01-17 MED ORDER — HEPARIN SODIUM (PORCINE) 5000 UNIT/ML IJ SOLN
5000.0000 [IU] | Freq: Three times a day (TID) | INTRAMUSCULAR | Status: DC
  Administered 2017-01-17 – 2017-01-19 (×6): 5000 [IU] via SUBCUTANEOUS
  Filled 2017-01-17 (×6): qty 1

## 2017-01-17 NOTE — ED Notes (Signed)
Pt provided with sprite and a turkey sandwich. 

## 2017-01-17 NOTE — ED Triage Notes (Signed)
Per EMS, pt transported from [employer], walked there from his home, [employer] staff reports pt sitting on a bench outside for an hour, and was about to fall over the bench and disoriented. Initial BP was 68/48 per EMS, after 1000 NS bolus,BP was 96/48.  Pt is alert but slow to respond.  Unknown baseline.  Denies any pain.

## 2017-01-17 NOTE — H&P (Signed)
Date: 01/18/2017               Patient Name:  Derrick Ramos MRN: [id number]  DOB: 08/21/1945 Age / Sex: 71 y.o., male   PCP: Derrick Mires, MD         Medical Service: Internal Medicine Teaching Service         Attending Physician: Dr. Bartholomew Crews, MD    First Contact: Dr. Johny Ramos Pager: [phone]  Second Contact: Dr. Reesa Ramos Pager: [phone]       After Hours (After 5p/  First Contact Pager: [phone]  weekends / holidays): Second Contact Pager: [phone]   Chief Complaint: Near Syncope  History of Present Illness: Derrick Ramos is a 71 yo M with a history of CHFrEF (EF 20-25%); CAD (s/p PCI 11/29/15), PVD, Atrial Fibrillation, HTN, Cirrhosis, and  Anemia who presents via EMS for Near syncope. He walked to [employer] earlier in the day and experienced and episode of nausea and vomiting while he was there. He then began to feel lightheaded and dizzy and sat down on a bench outside the store as he was leaving. He continued to experience these symptoms for an hour while sitting without relief. EMS was called and upon their arrival his BP was 68/48 and improved to 96/48 following a 1L bolus. He states he has not been eating or drinking very much lately. He endorses recent weight loss. He denies palpitations.  In the ED, He was found to have Cr 6.33 up from 1.08 4 months ago, BUN 112 (up from 8) and eGFR 9 (down from >60). He states he still makes urine and gets up about twice/night to urinate. He denies NSAID use, fever, chills, recent illness. Vitals in the ED showed BP 90s/50s, HR 80s, RR13, O2 100% RA. BMP showed elevated BUN and Cr as above and Bicarb of 14; CBC showed Hgb 7.8; Troponin 0.01; U/A showed small Hgb and Rare bacteria; FOBT negative. EKG showed NSR @ 74 BPM. CXR showed no acute abnormality, changes of COPD, Calcified right diaphragmatic pleural plaque, compatible with previous asbestos exposure. He was given an additional 1L IVF bolus. He is to be admitted for further workup and  treatment.   Meds:  Current Meds  Medication Sig  . acetaminophen (TYLENOL) 325 MG tablet Take 650 mg by mouth every 6 (six) hours as needed for mild pain.  Marland Kitchen aspirin EC 81 MG EC tablet Take 1 tablet (81 mg total) by mouth daily.  . furosemide (LASIX) 40 MG tablet Take 40 mg by mouth daily.   . iron polysaccharides (NIFEREX) 150 MG capsule Take 1 capsule (150 mg total) by mouth daily.  . nitroGLYCERIN (NITROSTAT) 0.4 MG SL tablet Place 1 tablet (0.4 mg total) under the tongue every 5 (five) minutes as needed for chest pain.  . pantoprazole (PROTONIX) 40 MG tablet Take 1 tablet (40 mg total) by mouth 2 (two) times daily. (Patient taking differently: Take 40 mg by mouth daily. )  . sacubitril-valsartan (ENTRESTO) 49-51 MG Take 1 tablet by mouth 2 (two) times daily.  Marland Kitchen thiamine (VITAMIN B-1) 100 MG tablet Take 100 mg by mouth daily.     Allergies: Allergies as of 01/17/2017  . (No Known Allergies)   Past Medical History:  Diagnosis Date  . Acute kidney injury ([place])    Archie Endo 11/21/2015  . Atrial fibrillation ([place])    Archie Endo 11/21/2015  . Dyspnea   . GERD (gastroesophageal reflux disease)   . GI bleed  2016   Archie Endo 11/21/2015  . H/O syncope    recurrent episodes/notes 11/21/2015  . Heart failure ([place]) 08/2016  . Hematemesis    Archie Endo 11/21/2015  . Hepatic cirrhosis ([place])    Archie Endo 11/21/2015  . History of blood transfusion 2016   related to GI bleed/notes 11/21/2015  . Hypertension   . PVD (peripheral vascular disease) ([place])    Archie Endo 11/21/2015  . Symptomatic anemia 08/2016  . Syncope and collapse    required staples to back of head/notes 11/21/2015    Family History:  Family History  Problem Relation Age of Onset  . Cancer Mother   . Diabetes Sister   . Chronic Renal Failure Sister   . Cancer Brother   . Cancer Father   . Heart disease Brother   - Confirmed on admission  Social History:  - Former smoker, quit 1.5 years ago, smoked 1 pack / week x 20 yrs - Drinks 2 cans of beer  per day - Denies illicit drug use - Lives with his wife  Review of Systems: A complete ROS was negative except as per HPI.  Physical Exam: Blood pressure (!) 109/96, pulse 87, temperature 97.6 F (36.4 C), temperature source Oral, resp. rate 14, height 5' 6"  (1.676 m), weight 115 lb 12.8 oz (52.5 kg), SpO2 100 %. Physical Exam  Constitutional: He appears well-developed.  Thin male  HENT:  Head: Normocephalic and atraumatic.  Eyes: EOM are normal. Right eye exhibits no discharge. Left eye exhibits no discharge.  Cardiovascular: Normal rate, regular rhythm and normal heart sounds.   Distant heart sounds  Pulmonary/Chest: Effort normal and breath sounds normal. No respiratory distress.  Abdominal: Soft. Bowel sounds are normal. He exhibits no distension. There is no tenderness.  Musculoskeletal: He exhibits no edema or deformity.  Neurological: He is alert.  Oriented to person, place and month, but not year  Skin: Skin is warm and dry.  Psychiatric: He has a normal mood and affect.    EKG: personally reviewed my interpretation is NSR @ 74 BPM.  CXR: personally reviewed and I agree with: - No acute abnormality - Changes of COPD - Calcified right diaphragmatic pleural plaque, compatible with previous asbestos exposure.   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  Acute Renal Failure: Patient presenting with Cr. 6.33 (up from 1.08 4 months ago). UA with non specific hyaline casts and microscopic hematuria, no evidence of ATN despite hypotension. Recent contrast load for R&L heart cath in April 2018. Denies NSAID use. Renal ultrasound negative for hydronephrosis. Consider Nephrology consult in AM. - Hold Entresto and Lasix - Multiple Myeloma Screen - Urine microalbumin - Renal function panel in AM  Near Syncope: In  the setting of hypotension with decreased oral intake (weight loss of 10 lbs in the past 4 months). BP improved with 2L IVF. Orthostatics negative (performed after fluid was given). Per, cardiology note he has a history of syncope and has had a Holter monitor that was unrevealing. - Hold Entresto and Lasix - Cardiac Monitoring - Repeat BMP in AM - Repeat EKG in AM  CHFrEF: EF 25% ([facility] 08/27/16) - Hold carvedilol, and lasix  - Caution with IVFs  CAD: S/P proximal LAD rotational atherectomy and BMS (11/29/15). Cath on 08/27/16 revealed patent proximal LAD stent, 70% mid LAD stenosis. -  Hold carvedilol - Continue Atorvastatin 27m Daily - Continue ASA 820mDaily  HTN: Hypotensive on presentation. - Hold carvedilol, entresto, and lasix   Anemia: Likely secondary to renal failure. FOBT negative in ED. - Continue Niferex 15049maily - Ferrtin, Iron, and TIBC in AM - CBC in AM  Afib: Per cardiology note, he has a history of paroxysmal atrial fibrillation spontaneously converting to sinus rhythm. Not oral anticoagulation candidate because of cirrhosis and alcohol use.  GERD:  - Continue protonix 4m83mD  Cirrhosis: In the setting of chronic and continued alcohol use. - Continue thiamine 100mg28mly   FEN: Regular Diet DVT Prophylaxis: Heparin Code Status: DNR  Dispo: Admit patient to Inpatient with expected length of stay greater than 2 midnights.  Signed: MelviNeva Seat9/05/2016, 12:21 AM  Pager: [phone]

## 2017-01-17 NOTE — ED Provider Notes (Signed)
[facility]-EMERGENCY DEPT Provider Note   CSN: [id number] Arrival date & time: 01/17/17  1747     History   Chief Complaint Chief Complaint  Patient presents with  . Heat Exposure    HPI Derrick Ramos is a 71 y.o. male.  HPI Patient brought in by EMS. Had walked to the grocery store from his house. It was over 90 today and it was around 10 minute walk. Can feel lightheaded and dizzy. Initial blood pressure was 60 systolic. Improved somewhat with 1 L of IV fluid but only up to the 80s. States he's been doing well otherwise. No change in urination. Reports good intake. States he does not have a doctor that he sees. Past Medical History:  Diagnosis Date  . Acute kidney injury (HCC)    [name] 11/21/2015  . Atrial fibrillation (H[facility])    [name] 11/21/2015  . Dyspnea   . GERD (gastroesophageal reflux disease)   . GI bleed 2016   [name] 11/21/2015  . H/O syncope    recurrent episodes/notes 11/21/2015  . Heart failure (H[facility]) 08/2016  . Hematemesis    [name] 11/21/2015  . Hepatic cirrhosis (HCC)    [name] 11/21/2015  . History of blood transfusion 2016   related to GI bleed/notes 11/21/2015  . Hypertension   . PVD (peripheral vascular disease) (H[facility])    [name] 11/21/2015  . Symptomatic anemia 08/2016  . Syncope and collapse    required staples to back of head/notes 11/21/2015    Patient Active Problem List   Diagnosis Date Noted  . CAD in native artery   . Pleural effusion 08/28/2016  . Acute on chronic diastolic congestive heart failure (HCC)   . Symptomatic anemia 08/27/2016  . Shortness of breath 07/10/2016  . Claudication (HCC) 05/29/2016  . Heart failure with reduced ejection fraction (HCC) 01/26/2016  . Leukocytosis   . Syncope   . CAD -S/P PCI 11/29/15 11/24/2015  . Atherosclerotic PVD with intermittent claudication (HCC) 11/24/2015  . Malnutrition of moderate degree 11/22/2015  . Syncope and collapse 11/21/2015  . Atrial fibrillation (HCC) 11/21/2015  . Hypokalemia 11/21/2015  .  HTN (hypertension) 11/21/2015  . Hepatic cirrhosis (HCC) 11/21/2015  . PUD (peptic ulcer disease) 11/21/2015  . AKI (acute kidney injury) (HCC) 11/21/2015  . Laceration of head 11/21/2015  . Tobacco use disorder 11/21/2015  . Alcohol abuse 11/21/2015    Past Surgical History:  Procedure Laterality Date  . CARDIAC CATHETERIZATION N/A 11/24/2015   Procedure: Left Heart Cath and Coronary Angiography;  Surgeon: Lyn Records, MD;  Location: [facility] INVASIVE CV LAB;  Service: Cardiovascular;  Laterality: N/A;  . CARDIAC CATHETERIZATION N/A 11/29/2015   Procedure: Coronary Stent Intervention ;  Surgeon: Kathleene Hazel, MD;  Location: [facility] INVASIVE CV LAB;  Service: Cardiovascular;  Laterality: N/A;  . COLONOSCOPY    . ESOPHAGOGASTRODUODENOSCOPY    . ESOPHAGOGASTRODUODENOSCOPY (EGD) WITH PROPOFOL N/A 11/27/2015   Procedure: ESOPHAGOGASTRODUODENOSCOPY (EGD) WITH PROPOFOL;  Surgeon: Willis Modena, MD;  Location: [facility] ENDOSCOPY;  Service: Endoscopy;  Laterality: N/A;  . RIGHT/LEFT HEART CATH AND CORONARY ANGIOGRAPHY N/A 08/30/2016   Procedure: Right/Left Heart Cath and Coronary Angiography;  Surgeon: Runell Gess, MD;  Location: [facility] INVASIVE CV LAB;  Service: Cardiovascular;  Laterality: N/A;       Home Medications    Prior to Admission medications   Medication Sig Start Date End Date Taking? Authorizing Provider  aspirin EC 81 MG EC tablet Take 1 tablet (81 mg total) by mouth daily. 12/01/15  Mikhail, Nita Sells, DO  atorvastatin (LIPITOR) 40 MG tablet TAKE 1 TABLET (40 MG TOTAL) BY MOUTH DAILY AT 6 PM. 10/07/16   Runell Gess, MD  carvedilol (COREG) 12.5 MG tablet Take 1 tablet (12.5 mg total) by mouth 2 (two) times daily with a meal. 10/31/16   Runell Gess, MD  furosemide (LASIX) 20 MG tablet Take 1 tablet by mouth daily and take 1 tablet daily as needed for leg swelling 12/18/16   Barrett, Joline Salt, PA-C  iron polysaccharides (NIFEREX) 150 MG capsule Take 1 capsule (150 mg total) by  mouth daily. 09/01/16   Alm Bustard, MD  nitroGLYCERIN (NITROSTAT) 0.4 MG SL tablet Place 1 tablet (0.4 mg total) under the tongue every 5 (five) minutes as needed for chest pain. 12/18/16   Barrett, Joline Salt, PA-C  pantoprazole (PROTONIX) 40 MG tablet Take 1 tablet (40 mg total) by mouth 2 (two) times daily. 12/01/15   Mikhail, Nita Sells, DO  sacubitril-valsartan (ENTRESTO) 49-51 MG Take 1 tablet by mouth 2 (two) times daily. 12/20/16   Runell Gess, MD  thiamine (VITAMIN B-1) 100 MG tablet Take 100 mg by mouth daily.    [provider]    Family History Family History  Problem Relation Age of Onset  . Cancer Mother   . Diabetes Sister   . Chronic Renal Failure Sister   . Cancer Brother     Social History Social History  Substance Use Topics  . Smoking status: Former Smoker    Packs/day: 0.12    Years: 55.00    Types: Cigarettes    Quit date: 11/21/2015  . Smokeless tobacco: Never Used  . Alcohol use 156.6 oz/week    93 Glasses of wine, 168 Cans of beer per week     Comment: 11/21/2015 daily, 2 - 6 pack tall cans daily beer and gallon wine 2-3 days     Allergies   Patient has no known allergies.   Review of Systems Review of Systems  Constitutional: Negative for appetite change and unexpected weight change.  HENT: Negative for dental problem.   Respiratory: Negative for shortness of breath.   Gastrointestinal: Negative for abdominal pain, anal bleeding and blood in stool.  Genitourinary: Negative for flank pain.  Musculoskeletal: Negative for back pain.  Neurological: Negative for tremors.  Hematological: Negative for adenopathy.     Physical Exam Updated Vital Signs BP (!) 109/96   Pulse 89   Temp (!) 97.5 F (36.4 C) (Oral)   Resp 13   SpO2 100%   Physical Exam  Constitutional: He appears well-developed.  HENT:  Head: Atraumatic.  Eyes: EOM are normal.  Cardiovascular: Normal rate.   Pulmonary/Chest: Effort normal.  Abdominal: Soft.    Musculoskeletal: He exhibits no edema.  Neurological: He is alert.  Skin: Skin is warm.     ED Treatments / Results  Labs (all labs ordered are listed, but only abnormal results are displayed) Labs Reviewed  BASIC METABOLIC PANEL - Abnormal; Notable for the following:       Result Value   CO2 14 (*)    Glucose, Bld 108 (*)    BUN 112 (*)    Creatinine, Ser 6.33 (*)    Calcium 7.6 (*)    GFR calc non Af Amer 8 (*)    GFR calc Af Amer 9 (*)    All other components within normal limits  CBC - Abnormal; Notable for the following:    RBC 2.72 (*)  Hemoglobin 7.8 (*)    HCT 24.2 (*)    RDW 17.3 (*)    Platelets 145 (*)    All other components within normal limits  I-STAT CHEM 8, ED - Abnormal; Notable for the following:    BUN 112 (*)    Creatinine, Ser 6.20 (*)    Calcium, Ion 1.04 (*)    TCO2 15 (*)    Hemoglobin 9.2 (*)    HCT 27.0 (*)    All other components within normal limits  URINALYSIS, ROUTINE W REFLEX MICROSCOPIC  I-STAT TROPONIN, ED  CBG MONITORING, ED  POC OCCULT BLOOD, ED    EKG  EKG Interpretation  Date/Time:  Friday January 17 2017 18:06:46 EDT Ventricular Rate:  74 PR Interval:    QRS Duration: 93 QT Interval:  432 QTC Calculation: 480 R Axis:   13 Text Interpretation:  Sinus rhythm Anterior infarct, old Confirmed by Benjiman Core [phone]) on 01/17/2017 7:20:23 PM       Radiology No results found.  Procedures Procedures (including critical care time)  Medications Ordered in ED Medications  sodium chloride 0.9 % bolus 1,000 mL (0 mLs Intravenous Stopped 01/17/17 1908)     Initial Impression / Assessment and Plan / ED Course  I have reviewed the triage vital signs and the nursing notes.  Pertinent labs & imaging results that were available during my care of the patient were reviewed by me and considered in my medical decision making (see chart for details).     Patient with hypotension and acute kidney injury. Creatinine elevated  and elevated BUN. May be due to dehydration. Blood pressures improved. Patient states he does not have a PCP. Will admit to unassigned.  Final Clinical Impressions(s) / ED Diagnoses   Final diagnoses:  AKI (acute kidney injury) (HCC)  Dehydration    New Prescriptions New Prescriptions   No medications on file     Benjiman Core, MD 01/17/17 [phone]

## 2017-01-18 ENCOUNTER — Encounter (HOSPITAL_COMMUNITY): Payer: Self-pay | Admitting: Internal Medicine

## 2017-01-18 DIAGNOSIS — R55 Syncope and collapse: Secondary | ICD-10-CM | POA: Diagnosis not present

## 2017-01-18 DIAGNOSIS — E46 Unspecified protein-calorie malnutrition: Secondary | ICD-10-CM | POA: Diagnosis present

## 2017-01-18 DIAGNOSIS — D649 Anemia, unspecified: Secondary | ICD-10-CM | POA: Diagnosis present

## 2017-01-18 DIAGNOSIS — I480 Paroxysmal atrial fibrillation: Secondary | ICD-10-CM

## 2017-01-18 DIAGNOSIS — T465X5A Adverse effect of other antihypertensive drugs, initial encounter: Secondary | ICD-10-CM | POA: Diagnosis present

## 2017-01-18 DIAGNOSIS — Z79899 Other long term (current) drug therapy: Secondary | ICD-10-CM | POA: Diagnosis not present

## 2017-01-18 DIAGNOSIS — I2510 Atherosclerotic heart disease of native coronary artery without angina pectoris: Secondary | ICD-10-CM

## 2017-01-18 DIAGNOSIS — Z8719 Personal history of other diseases of the digestive system: Secondary | ICD-10-CM | POA: Diagnosis not present

## 2017-01-18 DIAGNOSIS — K7460 Unspecified cirrhosis of liver: Secondary | ICD-10-CM | POA: Diagnosis present

## 2017-01-18 DIAGNOSIS — Z66 Do not resuscitate: Secondary | ICD-10-CM | POA: Diagnosis present

## 2017-01-18 DIAGNOSIS — I739 Peripheral vascular disease, unspecified: Secondary | ICD-10-CM

## 2017-01-18 DIAGNOSIS — E860 Dehydration: Secondary | ICD-10-CM

## 2017-01-18 DIAGNOSIS — R3129 Other microscopic hematuria: Secondary | ICD-10-CM | POA: Diagnosis present

## 2017-01-18 DIAGNOSIS — I959 Hypotension, unspecified: Secondary | ICD-10-CM | POA: Diagnosis present

## 2017-01-18 DIAGNOSIS — Z23 Encounter for immunization: Secondary | ICD-10-CM | POA: Diagnosis not present

## 2017-01-18 DIAGNOSIS — Z955 Presence of coronary angioplasty implant and graft: Secondary | ICD-10-CM | POA: Diagnosis not present

## 2017-01-18 DIAGNOSIS — K219 Gastro-esophageal reflux disease without esophagitis: Secondary | ICD-10-CM | POA: Diagnosis present

## 2017-01-18 DIAGNOSIS — Z87891 Personal history of nicotine dependence: Secondary | ICD-10-CM | POA: Diagnosis not present

## 2017-01-18 DIAGNOSIS — N179 Acute kidney failure, unspecified: Secondary | ICD-10-CM | POA: Diagnosis present

## 2017-01-18 DIAGNOSIS — Z77090 Contact with and (suspected) exposure to asbestos: Secondary | ICD-10-CM | POA: Diagnosis present

## 2017-01-18 DIAGNOSIS — Z7982 Long term (current) use of aspirin: Secondary | ICD-10-CM | POA: Diagnosis not present

## 2017-01-18 DIAGNOSIS — I5022 Chronic systolic (congestive) heart failure: Secondary | ICD-10-CM | POA: Diagnosis present

## 2017-01-18 DIAGNOSIS — I110 Hypertensive heart disease with heart failure: Secondary | ICD-10-CM | POA: Diagnosis present

## 2017-01-18 DIAGNOSIS — Z681 Body mass index (BMI) 19 or less, adult: Secondary | ICD-10-CM | POA: Diagnosis not present

## 2017-01-18 LAB — RAPID URINE DRUG SCREEN, HOSP PERFORMED
Amphetamines: NOT DETECTED
Barbiturates: NOT DETECTED
Benzodiazepines: NOT DETECTED
Cocaine: NOT DETECTED
Opiates: NOT DETECTED
Tetrahydrocannabinol: NOT DETECTED

## 2017-01-18 LAB — CBC
HCT: 24.5 % — ABNORMAL LOW (ref 39.0–52.0)
Hemoglobin: 8.1 g/dL — ABNORMAL LOW (ref 13.0–17.0)
MCH: 29 pg (ref 26.0–34.0)
MCHC: 33.1 g/dL (ref 30.0–36.0)
MCV: 87.8 fL (ref 78.0–100.0)
PLATELETS: 161 10*3/uL (ref 150–400)
RBC: 2.79 MIL/uL — ABNORMAL LOW (ref 4.22–5.81)
RDW: 17.3 % — AB (ref 11.5–15.5)
WBC: 5 10*3/uL (ref 4.0–10.5)

## 2017-01-18 LAB — RENAL FUNCTION PANEL
Albumin: 3.6 g/dL (ref 3.5–5.0)
Anion gap: 11 (ref 5–15)
BUN: 106 mg/dL — ABNORMAL HIGH (ref 6–20)
CO2: 16 mmol/L — ABNORMAL LOW (ref 22–32)
Calcium: 7.7 mg/dL — ABNORMAL LOW (ref 8.9–10.3)
Chloride: 110 mmol/L (ref 101–111)
Creatinine, Ser: 4.62 mg/dL — ABNORMAL HIGH (ref 0.61–1.24)
GFR calc Af Amer: 13 mL/min — ABNORMAL LOW (ref >60)
GFR calc non Af Amer: 11 mL/min — ABNORMAL LOW (ref >60)
Glucose, Bld: 110 mg/dL — ABNORMAL HIGH (ref 65–99)
Phosphorus: 3.9 mg/dL (ref 2.5–4.6)
Potassium: 4.6 mmol/L (ref 3.5–5.1)
Sodium: 137 mmol/L (ref 135–145)

## 2017-01-18 LAB — IRON AND TIBC
Iron: 78 ug/dL (ref 45–182)
Saturation Ratios: 29 % (ref 17.9–39.5)
TIBC: 273 ug/dL (ref 250–450)
UIBC: 195 ug/dL

## 2017-01-18 LAB — FERRITIN: Ferritin: 129 ng/mL (ref 24–336)

## 2017-01-18 MED ORDER — SODIUM CHLORIDE 0.9 % IV SOLN
INTRAVENOUS | Status: DC
  Administered 2017-01-18: 10:00:00 via INTRAVENOUS

## 2017-01-18 NOTE — Consult Note (Signed)
Renal Service Consult Note [facility]  Derrick Ramos 01/18/2017 Derrick Ramos D Requesting Physician:  Dr Rogelia Boga  Reason for Consult:  REnal failure HPI: The patient is a 71 y.o. year-old with hx of CAD, PVD, atrial fib and CHF who presented to ED with dizziness of 1 day's duration, also nausea and vomiting.  He had been walking outside in high heat.  Came to ED where SBP was in the 60's and labs showed creat of 6.33.  Pt was admitted for hypotension and AKI.   Overnight he rec'd IVF's and creat is down to 4 today.  Asked to see for AKI.     Pt is fair historian.  Denies OTC nsaid use, takes tylenol occ, drinks beer daily about "2 beers" a day, no tob or drug use.  Lives w/ his wife in [place].  Grew up in [place] outside of [place].  No voiding complaints or hx of kidney, prostate or bladder disease.  Has had a stent placed in his heart.  Denies surg hx.      ROS  denies CP  no joint pain   no HA  no blurry vision  no rash  no diarrhea  no dysuria  no difficulty voiding  no change in urine color    Past Medical History  Past Medical History:  Diagnosis Date  . Acute kidney injury (HCC)    [name] 11/21/2015  . Atrial fibrillation (HCC)    [name] 11/21/2015  . Dyspnea   . GERD (gastroesophageal reflux disease)   . GI bleed 2016   [name] 11/21/2015  . H/O syncope    recurrent episodes/notes 11/21/2015  . Heart failure (HCC) 08/2016  . Hematemesis    [name] 11/21/2015  . Hepatic cirrhosis (HCC)    [name] 11/21/2015  . History of blood transfusion 2016   related to GI bleed/notes 11/21/2015  . Hypertension   . PVD (peripheral vascular disease) (HCC)    [name] 11/21/2015  . Symptomatic anemia 08/2016  . Syncope and collapse    required staples to back of head/notes 11/21/2015   Past Surgical History  Past Surgical History:  Procedure Laterality Date  . CARDIAC CATHETERIZATION N/A 11/24/2015   Procedure: Left Heart Cath and Coronary Angiography;  Surgeon: Lyn Records,  MD;  Location: [facility] INVASIVE CV LAB;  Service: Cardiovascular;  Laterality: N/A;  . CARDIAC CATHETERIZATION N/A 11/29/2015   Procedure: Coronary Stent Intervention ;  Surgeon: Kathleene Hazel, MD;  Location: [facility] INVASIVE CV LAB;  Service: Cardiovascular;  Laterality: N/A;  . COLONOSCOPY    . ESOPHAGOGASTRODUODENOSCOPY    . ESOPHAGOGASTRODUODENOSCOPY (EGD) WITH PROPOFOL N/A 11/27/2015   Procedure: ESOPHAGOGASTRODUODENOSCOPY (EGD) WITH PROPOFOL;  Surgeon: Willis Modena, MD;  Location: [facility] ENDOSCOPY;  Service: Endoscopy;  Laterality: N/A;  . RIGHT/LEFT HEART CATH AND CORONARY ANGIOGRAPHY N/A 08/30/2016   Procedure: Right/Left Heart Cath and Coronary Angiography;  Surgeon: Runell Gess, MD;  Location: [facility] INVASIVE CV LAB;  Service: Cardiovascular;  Laterality: N/A;   Family History  Family History  Problem Relation Age of Onset  . Cancer Mother   . Diabetes Sister   . Chronic Renal Failure Sister   . Cancer Brother   . Cancer Father   . Heart disease Brother    Social History  reports that he quit smoking about 13 months ago. His smoking use included Cigarettes. He has a 6.60 pack-year smoking history. He has never used smokeless tobacco. He reports that he drinks about 156.6 oz of alcohol per week .  He reports that he does not use drugs. Allergies No Known Allergies Home medications Prior to Admission medications   Medication Sig Start Date End Date Taking? Authorizing Provider  acetaminophen (TYLENOL) 325 MG tablet Take 650 mg by mouth every 6 (six) hours as needed for mild pain.   Yes [provider]  aspirin EC 81 MG EC tablet Take 1 tablet (81 mg total) by mouth daily. 12/01/15  Yes Mikhail, Maryann, DO  furosemide (LASIX) 40 MG tablet Take 40 mg by mouth daily.    Yes [provider]  iron polysaccharides (NIFEREX) 150 MG capsule Take 1 capsule (150 mg total) by mouth daily. 09/01/16  Yes Alm Bustard, MD  nitroGLYCERIN (NITROSTAT) 0.4 MG SL tablet Place 1  tablet (0.4 mg total) under the tongue every 5 (five) minutes as needed for chest pain. 12/18/16  Yes Barrett, Joline Salt, PA-C  pantoprazole (PROTONIX) 40 MG tablet Take 1 tablet (40 mg total) by mouth 2 (two) times daily. Patient taking differently: Take 40 mg by mouth daily.  12/01/15  Yes Mikhail, Maryann, DO  sacubitril-valsartan (ENTRESTO) 49-51 MG Take 1 tablet by mouth 2 (two) times daily. 12/20/16  Yes Runell Gess, MD  thiamine (VITAMIN B-1) 100 MG tablet Take 100 mg by mouth daily.   Yes [provider]  atorvastatin (LIPITOR) 40 MG tablet TAKE 1 TABLET (40 MG TOTAL) BY MOUTH DAILY AT 6 PM. Patient not taking: Reported on 01/17/2017 10/07/16   Runell Gess, MD  carvedilol (COREG) 12.5 MG tablet Take 1 tablet (12.5 mg total) by mouth 2 (two) times daily with a meal. Patient not taking: Reported on 01/17/2017 10/31/16   Runell Gess, MD  furosemide (LASIX) 20 MG tablet Take 1 tablet by mouth daily and take 1 tablet daily as needed for leg swelling Patient not taking: Reported on 01/17/2017 12/18/16   Barrett, Joline Salt, PA-C   Liver Function Tests  Recent Labs Lab 01/18/17 0530  ALBUMIN 3.6   No results for input(s): LIPASE, AMYLASE in the last 168 hours. CBC  Recent Labs Lab 01/17/17 1800 01/17/17 1943 01/18/17 0530  WBC 6.0  --  5.0  HGB 7.8* 9.2* 8.1*  HCT 24.2* 27.0* 24.5*  MCV 89.0  --  87.8  PLT 145*  --  161   Basic Metabolic Panel  Recent Labs Lab 01/17/17 1800 01/17/17 1943 01/18/17 0530  NA 136 137 137  K 4.9 4.7 4.6  CL 108 107 110  CO2 14*  --  16*  GLUCOSE 108* 95 110*  BUN 112* 112* 106*  CREATININE 6.33* 6.20* 4.62*  CALCIUM 7.6*  --  7.7*  PHOS  --   --  3.9   Iron/TIBC/Ferritin/ %Sat    Component Value Date/Time   IRON 78 01/18/2017 0530   TIBC 273 01/18/2017 0530   FERRITIN 129 01/18/2017 0530   IRONPCTSAT 29 01/18/2017 0530    Vitals:   01/17/17 1915 01/17/17 2213 01/18/17 0534 01/18/17 0801  BP: (!) 109/96  108/64  120/68  Pulse: 89 87 74 74  Resp: 13 14 16 18   Temp:  97.6 F (36.4 C) 98.1 F (36.7 C) 98 F (36.7 C)  TempSrc:  Oral Oral Oral  SpO2: 100% 100% 100% 100%  Weight:  52.5 kg (115 lb 12.8 oz)    Height:  5\' 6"  (1.676 m)     Exam Gen small framed AAM, no distress, calm No rash, cyanosis or gangrene Sclera anicteric, throat clear  No jvd or  bruits Chest clear bilat RRR no MRG Abd soft ntnd no mass or ascites +bs GU normal male MS no joint effusions or deformity Ext no LE or UE edema / no wounds or ulcers Neuro is alert, Ox 3 , nf  UA > neg protein, 0-5 rbc/ wbc/ epi Renal US > 11- 12 cm kidneys, no hydro CXR > 1. No acute abnormality. 2. Changes of COPD. 3. Calcified right diaphragmatic pleural plaque, compatible with previous asbestos exposure.  Home meds > entresto, lipitor, coreg, lasix 20 qd and prn/ lasix 40/d, ecasa, niferex, SL NTG, PPI  Impression: 1.  Acute renal failure - suspect functional AKI due to severe vol depletion and ARB therapy. Creat was normal in April at 1.08. Recommend continue Rx as you're doing , withholding ARB/neprilisyn combination and giving IVF"s.  Suspect he will continue to improve.  Don't see any underlying indicators to suggest intrinsic renal disease outside of AKI.  Avoid BP lowering meds, continue to let BP's come up.  Will follow.  2  CAD hx stent 3  Hx systolic CHF - EF 20-25% 4  Hx cirrhosis 5  Possible etoh abuse 6  Hx GIB/ anemia    Plan - as above  Vinson Moselle MD [employer] pager [phone]   01/18/2017, 2:18 PM

## 2017-01-18 NOTE — Progress Notes (Signed)
   Subjective: No acute events overnight following admission, he has had no further sx of near syncope or nausea, vomiting. He denies any specific complaints this morning. Making some urine. No history of difficulty initiating urination, dribbling urine.   Objective:  Vital signs in last 24 hours: Vitals:   01/17/17 1915 01/17/17 2213 01/18/17 0534 01/18/17 0801  BP: (!) 109/96  108/64 120/68  Pulse: 89 87 74 74  Resp: 13 14 16 18   Temp:  97.6 F (36.4 C) 98.1 F (36.7 C) 98 F (36.7 C)  TempSrc:  Oral Oral Oral  SpO2: 100% 100% 100% 100%  Weight:  115 lb 12.8 oz (52.5 kg)    Height:  5\' 6"  (1.676 m)     Physical Exam  Constitutional: He is oriented to person, place, and time.  Frail appearing elderly gentleman resting in bed in no acute distress   HENT:  Head: Normocephalic and atraumatic.  Mouth/Throat: Oropharynx is clear and moist.  Cardiovascular: Normal rate, regular rhythm and normal heart sounds.  Exam reveals no friction rub.   No murmur heard. Pulmonary/Chest: Effort normal and breath sounds normal. No respiratory distress.  Abdominal: Soft. There is no tenderness.  Musculoskeletal: He exhibits no edema.  Muscle wasting   Neurological: He is alert and oriented to person, place, and time.  Skin: Skin is warm and dry.     Assessment/Plan:  Acute Renal Failure Cr of 6.33 on admission, available records show his Cr was 1.08 in 4/18. Differential for etiology includes prerenal with poor PO intake with documented weight loss, ATN with hypotension.  U/A does not show granular casts expected with ATN and hypotensive episode, Renal US negative for hydronephrosis or other abnormalities, post-void residual unremarkable. Workup also initiated for myeloma. Nephrology consulted for evaluation and recommendations regarding further workup, appreciate recommendations. Cr improved with IVF, though hydration is difficult given his HF and reported cirrhosis comorbidity.  --Renal  function panel --Hold Lasix, Entresto   --F/u SPEP, IFE, microalbumin: Cr ratio  --Urine microscopy for cast eval --Discontinue IVF this afternoon, encourage PO intake    Near Syncope Pt presented after symptoms of light-headedness, n/v and found to have hypotensive values which responded IVF. Currently normotensive and asymptomatic. Etiology felt to be due to poor PO intake and dehydration with continued Lasix and entresto use. --Encourage PO intake --Hold Lasix, Entresto as above  --Hold carvedilol   Malnutrition, Weight loss He has had documented weight loss over time. 135 lbs recorded in April, currently 115 lbs. May consider diet consult for recommendations to increase nutritional status. Further evaluation for underlying pathology when stable as an outpatient.  --Encourage PO intake    HFrEF Echo and cath in April with EF 20-25% with LVH, global hypokinesis, severe right atrial dilation. Currently on Lasix, entresto, carvedilol.       Dispo: Anticipated discharge in approximately 2-3 day(s).   Derrick Carne, MD 01/18/2017, 11:11 AM Pager: [phone]

## 2017-01-18 NOTE — Progress Notes (Signed)
Bladder scan done before start of the iv fluid, .

## 2017-01-19 LAB — BASIC METABOLIC PANEL
Anion gap: 7 (ref 5–15)
BUN: 71 mg/dL — AB (ref 6–20)
CHLORIDE: 115 mmol/L — AB (ref 101–111)
CO2: 18 mmol/L — ABNORMAL LOW (ref 22–32)
CREATININE: 2.15 mg/dL — AB (ref 0.61–1.24)
Calcium: 7.7 mg/dL — ABNORMAL LOW (ref 8.9–10.3)
GFR calc Af Amer: 34 mL/min — ABNORMAL LOW (ref >60)
GFR calc non Af Amer: 29 mL/min — ABNORMAL LOW (ref >60)
GLUCOSE: 156 mg/dL — AB (ref 65–99)
Potassium: 3.6 mmol/L (ref 3.5–5.1)
SODIUM: 140 mmol/L (ref 135–145)

## 2017-01-19 LAB — MICROALBUMIN / CREATININE URINE RATIO
CREATININE, UR: 79.8 mg/dL
MICROALB/CREAT RATIO: 51.8 mg/g{creat} — AB (ref 0.0–30.0)
Microalb, Ur: 41.3 ug/mL — ABNORMAL HIGH

## 2017-01-19 MED ORDER — CARVEDILOL 12.5 MG PO TABS: 12.5000 mg | ORAL_TABLET | Freq: Two times a day (BID) | ORAL | Status: DC

## 2017-01-19 NOTE — Progress Notes (Signed)
[facility] Progress Note  Subjective: Creat down 2.1 today, pt feeling better, taking po liquids  Vitals:   01/18/17 1957 01/19/17 0138 01/19/17 0513 01/19/17 0749  BP: 136/63  (!) 151/81 129/71  Pulse: 74  73 71  Resp: 16  14 16   Temp: 98.2 F (36.8 C)  98.8 F (37.1 C) 98.1 F (36.7 C)  TempSrc:    Oral  SpO2: 100%  100% 100%  Weight:  52.5 kg (115 lb 11.9 oz)    Height:        Inpatient medications: . aspirin EC  81 mg Oral Daily  . atorvastatin  40 mg Oral q1800  . carvedilol  12.5 mg Oral BID WC  . heparin  5,000 Units Subcutaneous Q8H  . iron polysaccharides  150 mg Oral Daily  . pantoprazole  40 mg Oral BID  . thiamine  100 mg Oral Daily    acetaminophen **OR** acetaminophen, senna-docusate  Exam: Gen small framed AAM, no distress, calm Sclera anicteric, throat clear  No jvd or bruits Chest clear bilat RRR no MRG Abd soft ntnd no mass or ascites +bs Neuro is alert, Ox 3 , nf  UA > neg protein, 0-5 rbc/ wbc/ epi Renal US > 11- 12 cm kidneys, no hydro CXR > 1. No acute abnormality. 2. Changes of COPD. 3. Calcified right diaphragmatic pleural plaque, compatible with previous asbestos exposure.  Home meds > entresto, lipitor, coreg, lasix 20 qd and prn/ lasix 40/d, ecasa, niferex, SL NTG, PPI  Impression: 1.  Acute renal failure - resolving, suspect functional AKI due to hypotension/ vol depletion and neprilysin inhibitor/ARB therapy.  The Sherryll Burger was started in July , changed from an ACEi prior to that.  May need other Rx (other than ACEi/ ARB therapy) for his low EF/ HTN.  Is followed by cardiology for this, would defer to them.  2  CAD hx stent 3  Hx systolic CHF - EF 20-25% 4  Hx cirrhosis 5  Possible etoh abuse 6  Hx GIB/ anemia  Plan - will sign off, please call as needed.    Vinson Moselle MD [facility] pager [phone]   01/19/2017, 11:32 AM    Recent Labs Lab 01/17/17 1800 01/17/17 1943 01/18/17 0530  01/19/17 0927  NA 136 137 137 140  K 4.9 4.7 4.6 3.6  CL 108 107 110 115*  CO2 14*  --  16* 18*  GLUCOSE 108* 95 110* 156*  BUN 112* 112* 106* 71*  CREATININE 6.33* 6.20* 4.62* 2.15*  CALCIUM 7.6*  --  7.7* 7.7*  PHOS  --   --  3.9  --     Recent Labs Lab 01/18/17 0530  ALBUMIN 3.6    Recent Labs Lab 01/17/17 1800 01/17/17 1943 01/18/17 0530  WBC 6.0  --  5.0  HGB 7.8* 9.2* 8.1*  HCT 24.2* 27.0* 24.5*  MCV 89.0  --  87.8  PLT 145*  --  161   Iron/TIBC/Ferritin/ %Sat    Component Value Date/Time   IRON 78 01/18/2017 0530   TIBC 273 01/18/2017 0530   FERRITIN 129 01/18/2017 0530   IRONPCTSAT 29 01/18/2017 0530

## 2017-01-19 NOTE — Discharge Summary (Signed)
Name: Derrick Ramos MRN: [id number] DOB: March 27, 1946 71 y.o. PCP: Katherina Mires, MD  Date of Admission: 01/17/2017  5:47 PM Date of Discharge: 01/19/2017 Attending Physician: Bartholomew Crews, MD  Discharge Diagnosis: 1. Acute kidney injury due to hypotension and dehydration. Active Problems:   Renal failure   Discharge Medications: Allergies as of 01/19/2017   No Known Allergies     Medication List    STOP taking these medications   furosemide 20 MG tablet Commonly known as:  LASIX   furosemide 40 MG tablet Commonly known as:  LASIX   sacubitril-valsartan 49-51 MG Commonly known as:  ENTRESTO     TAKE these medications   acetaminophen 325 MG tablet Commonly known as:  TYLENOL Take 650 mg by mouth every 6 (six) hours as needed for mild pain.   aspirin 81 MG EC tablet Take 1 tablet (81 mg total) by mouth daily.   atorvastatin 40 MG tablet Commonly known as:  LIPITOR TAKE 1 TABLET (40 MG TOTAL) BY MOUTH DAILY AT 6 PM.   carvedilol 12.5 MG tablet Commonly known as:  COREG Take 1 tablet (12.5 mg total) by mouth 2 (two) times daily with a meal.   iron polysaccharides 150 MG capsule Commonly known as:  NIFEREX Take 1 capsule (150 mg total) by mouth daily.   nitroGLYCERIN 0.4 MG SL tablet Commonly known as:  NITROSTAT Place 1 tablet (0.4 mg total) under the tongue every 5 (five) minutes as needed for chest pain.   pantoprazole 40 MG tablet Commonly known as:  PROTONIX Take 1 tablet (40 mg total) by mouth 2 (two) times daily. What changed:  when to take this   thiamine 100 MG tablet Commonly known as:  VITAMIN B-1 Take 100 mg by mouth daily.            Discharge Care Instructions        Start     Ordered   01/19/17 0000  Increase activity slowly     01/19/17 1213   01/19/17 0000  Diet - low sodium heart healthy     01/19/17 1213   01/19/17 0000  Discharge instructions    Comments:  It was pleasure taking care of you. I talked with  cardiology,they will make arrangement for you to be seen at Dr. Kennon Holter office early next week.You can also try calling them on Tuesday morning to confirm that appointment. Please do not take your Lasix and Entresto at this time, your cardiologist will reevaluate you and then let you know which medication to take now. Please keep yourself well hydrated.   01/19/17 1213   01/19/17 0000  Call MD for:  extreme fatigue     01/19/17 1213   01/19/17 0000  Call MD for:  persistant dizziness or light-headedness     01/19/17 1213   01/19/17 0000  (HEART FAILURE PATIENTS) Call MD:  Anytime you have any of the following symptoms: 1) 3 pound weight gain in 24 hours or 5 pounds in 1 week 2) shortness of breath, with or without a dry hacking cough 3) swelling in the hands, feet or stomach 4) if you have to sleep on extra pillows at night in order to breathe.     01/19/17 1213      Disposition and follow-up:   Mr.Shad Kuck was discharged from [facility] in Good condition.  At the hospital follow up visit please address:  1.  His blood pressure and volume status,as we held  his Lasix and Entresto, these medications can be restarted if needed according to his blood pressure and renal function.  2.  Labs / imaging needed at time of follow-up: BMP  3.  Pending labs/ test needing follow-up: immunology fixational electrophoresis and protein electrophoresis.  Follow-up Appointments:   Hospital Course by problem list:  Mr. Grey is 71 year old gentleman with past medical history significant for CAD, PVD, A. Fib and heart failure with reduced ejection fraction of 20-25% presented after having a near syncope at a grocery store.  Presyncope. He was found hypotensive, and appears walking him down. He responded very well to fluid resuscitation.Patient also given history of decreased appetite and weight loss,documented weight loss of 15 pounds over 6 months. His presyncope was most likely due to  poor by mouth intake with diuretic use making him dehydrated. He was also found to have AK I.  AKI: he was found to have elevated creatinine at 6.33 and BUN above 100,with no history of CK D.His kidney function improved markedly with fluid resuscitation. On discharge his creatinine was 2.15.Nephrology was consulted and they do not think that he has any intrinsic renal disease.His Lasix and Entresto because of his softer blood pressure and AK I. These medications can be restarted during his follow-up visit if needed.  Hypertension. He was hypotensive on presentation, later become normotensive.We restarted his carvedilol. Delene Loll can be restarted if needed once his kidney functions normalized.  HFrEF: Echo and cath in April with EF 20-25% with LVH, global hypokinesis, severe right atrial dilation. We restarted his carvedilol on discharge. He will need a close follow-up at heart failure clinic as we kept holding his Lasix. Cardiology was consulted and they will make an arrangement to get him seen later this week.  Malnutrition, Weight loss: He gave [place] an history of decreased in his appetite and weight loss. On exam he to have muscle wasting. We did multiple myeloma labs, results are still pending at discharge. He will need further workup as an outpatient for his weight loss.  Discharge Vitals:   BP 129/71 (BP Location: Right Arm)   Pulse 71   Temp 98.1 F (36.7 C) (Oral)   Resp 16   Ht 5' 6"  (1.676 m)   Wt 115 lb 11.9 oz (52.5 kg)   SpO2 100%   BMI 18.68 kg/m   Pertinent Labs, Studies, and Procedures:  BMP Latest Ref Rng & Units 01/19/2017 01/18/2017 01/17/2017  Glucose 65 - 99 mg/dL 156(H) 110(H) 95  BUN 6 - 20 mg/dL 71(H) 106(H) 112(H)  Creatinine 0.61 - 1.24 mg/dL 2.15(H) 4.62(H) 6.20(H)  Sodium 135 - 145 mmol/L 140 137 137  Potassium 3.5 - 5.1 mmol/L 3.6 4.6 4.7  Chloride 101 - 111 mmol/L 115(H) 110 107  CO2 22 - 32 mmol/L 18(L) 16(L) -  Calcium 8.9 - 10.3 mg/dL 7.7(L) 7.7(L) -   CBC  Latest Ref Rng & Units 01/18/2017 01/17/2017 01/17/2017  WBC 4.0 - 10.5 K/uL 5.0 - 6.0  Hemoglobin 13.0 - 17.0 g/dL 8.1(L) 9.2(L) 7.8(L)  Hematocrit 39.0 - 52.0 % 24.5(L) 27.0(L) 24.2(L)  Platelets 150 - 400 K/uL 161 - 145(L)   Iron/TIBC/Ferritin/ %Sat    Component Value Date/Time   IRON 78 01/18/2017 0530   TIBC 273 01/18/2017 0530   FERRITIN 129 01/18/2017 0530   IRONPCTSAT 29 01/18/2017 0530   Urinalysis    Component Value Date/Time   COLORURINE YELLOW 01/17/2017 1945   APPEARANCEUR CLEAR 01/17/2017 1945   LABSPEC 1.009 01/17/2017 1945  PHURINE 5.0 01/17/2017 1945   GLUCOSEU NEGATIVE 01/17/2017 1945   HGBUR SMALL (A) 01/17/2017 1945   BILIRUBINUR NEGATIVE 01/17/2017 [place] NEGATIVE 01/17/2017 1945   PROTEINUR NEGATIVE 01/17/2017 1945   NITRITE NEGATIVE 01/17/2017 1945   LEUKOCYTESUR NEGATIVE 01/17/2017 1945   Drugs of Abuse     Component Value Date/Time   LABOPIA NONE DETECTED 01/17/2017 1945   COCAINSCRNUR NONE DETECTED 01/17/2017 1945   LABBENZ NONE DETECTED 01/17/2017 1945   AMPHETMU NONE DETECTED 01/17/2017 1945   THCU NONE DETECTED 01/17/2017 1945   LABBARB NONE DETECTED 01/17/2017 1945    POC occult blood. Negative  Microalbumin / creatinine urine ratio  Order: [id number]  Status:  Final result Visible to patient:  No (Not Released) Next appt:  02/05/2017 at 03:30 PM in Cardiology ([facility])   Ref Range & Units 3d ago  Microalb, Ur Not Estab. ug/mL 41.3    Microalb Creat Ratio 0.0 - 30.0 mg/g creat 51.8         CXR: FINDINGS: Normal sized heart. Tortuous aorta. Clear lungs. The lungs are hyperexpanded. Mild thoracic spine degenerative changes. Calcified right diaphragmatic pleural plaque. Old, healed left rib fractures.  IMPRESSION: 1. No acute abnormality. 2. Changes of COPD. 3. Calcified right diaphragmatic pleural plaque, compatible with previous asbestos exposure.  US Renal: FINDINGS: Right Kidney:  Length: 11.8 cm.  Echogenicity within normal limits. No mass or hydronephrosis visualized.  Left Kidney:  Length: 11.3 cm. Echogenicity within normal limits. No mass or hydronephrosis visualized.  Bladder:  Appears normal for degree of bladder distention.  IMPRESSION: Negative for hydronephrosis. Normal parenchymal echogenicity and thickness.  EKG: Sinus rhythm, without any acute changes.  Discharge Instructions: Discharge Instructions    (HEART FAILURE PATIENTS) Call MD:  Anytime you have any of the following symptoms: 1) 3 pound weight gain in 24 hours or 5 pounds in 1 week 2) shortness of breath, with or without a dry hacking cough 3) swelling in the hands, feet or stomach 4) if you have to sleep on extra pillows at night in order to breathe.    Complete by:  As directed    Call MD for:  extreme fatigue    Complete by:  As directed    Call MD for:  persistant dizziness or light-headedness    Complete by:  As directed    Diet - low sodium heart healthy    Complete by:  As directed    Discharge instructions    Complete by:  As directed    It was pleasure taking care of you. I talked with cardiology,they will make arrangement for you to be seen at Dr. Kennon Holter office early next week.You can also try calling them on Tuesday morning to confirm that appointment. Please do not take your Lasix and Entresto at this time, your cardiologist will reevaluate you and then let you know which medication to take now. Please keep yourself well hydrated.   Increase activity slowly    Complete by:  As directed       Signed: Lorella Nimrod, MD 01/19/2017, 12:14 PM   Pager: [phone]

## 2017-01-19 NOTE — Progress Notes (Signed)
Patient Discharge: Disposition: Patient discharged to home. Education: Reviewed medications, prescriptions, follow-up appointments and discharge instructions, understood and acknowledged. IV: Discontinued IV before discharge. Telemetry: Discontinued cardiac monitoring, CCMD notified. Transportation: Patient escorted out of the unit in w/c. Belongings: Patient took all his belongings with him.

## 2017-01-19 NOTE — Progress Notes (Signed)
   Subjective: patient was feeling better this morning. Denies any chest pain, dizziness or palpitations. He wants to go home.  Objective:  Vital signs in last 24 hours: Vitals:   01/18/17 1957 01/19/17 0138 01/19/17 0513 01/19/17 0749  BP: 136/63  (!) 151/81 129/71  Pulse: 74  73 71  Resp: 16  14 16   Temp: 98.2 F (36.8 C)  98.8 F (37.1 C) 98.1 F (36.7 C)  TempSrc:    Oral  SpO2: 100%  100% 100%  Weight:  115 lb 11.9 oz (52.5 kg)    Height:       Gen. Chronically ill looking elderly man, lying comfortably in his bed, in no acute distress. Chest.Clear bilaterally CV.regular rate and rhythm. Abdomen.soft, non tender, bowel sounds positive. Extremities. No edema, no cyanosis, pulses intact and symmetrical.  Labs. BMP Latest Ref Rng & Units 01/19/2017 01/18/2017 01/17/2017  Glucose 65 - 99 mg/dL 383(K) 184(C) 95  BUN 6 - 20 mg/dL 37(V) 436(G) 677(C)  Creatinine 0.61 - 1.24 mg/dL 3.40(B) 5.24(E) 1.85(T)  Sodium 135 - 145 mmol/L 140 137 137  Potassium 3.5 - 5.1 mmol/L 3.6 4.6 4.7  Chloride 101 - 111 mmol/L 115(H) 110 107  CO2 22 - 32 mmol/L 18(L) 16(L) -  Calcium 8.9 - 10.3 mg/dL 7.7(L) 7.7(L) -    Assessment/Plan:  AKI: Most likely prerenal because of poor by mouth intake.nephrology do not think that he has any intrinsic  renal disease.his renal function continued to improve with gentle hydration. IV fluids were stopped yesterday because of his poor ejection fraction. -Encourage patient to increase by mouth intake. -keep holding Lasix. -MM labs still pending.  Near Syncope.Resolved.  Hypertension. He is currently normotensive, one reading of elevated blood pressure. -We can restart carvedilol. -Keep holding Entresto-will need a close follow-up,it can be restarted during that follow-up visit.  Malnutrition, Weight loss.he has muscle wasting. He will need further workup as an outpatient her weight loss. -MM labs still pending. -Encourage by mouth intake.  HFrEF Echo  and cath in April with EF 20-25% with LVH, global hypokinesis, severe right atrial dilation.  -we will restart carvedilol today. -Keep holding Lasix and entresto. -will need a close follow-up at heart failure clinic.   Dispo: Can be discharged today.  Arnetha Courser, MD 01/19/2017, 11:15 AM Pager: [phone]

## 2017-01-20 LAB — IMMUNOFIXATION ELECTROPHORESIS
IGM (IMMUNOGLOBULIN M), SRM: 121 mg/dL (ref 15–143)
IgA: 327 mg/dL (ref 61–437)
IgG (Immunoglobin G), Serum: 1600 mg/dL (ref 700–1600)
Total Protein ELP: 7.5 g/dL (ref 6.0–8.5)

## 2017-01-21 ENCOUNTER — Ambulatory Visit: Payer: Medicare Other

## 2017-01-21 LAB — PROTEIN ELECTROPHORESIS, SERUM
A/G Ratio: 1.2 (ref 0.7–1.7)
ALPHA-2-GLOBULIN: 0.9 g/dL (ref 0.4–1.0)
Albumin ELP: 4.2 g/dL (ref 2.9–4.4)
Alpha-1-Globulin: 0.1 g/dL (ref 0.0–0.4)
Beta Globulin: 0.9 g/dL (ref 0.7–1.3)
GLOBULIN, TOTAL: 3.6 g/dL (ref 2.2–3.9)
Gamma Globulin: 1.7 g/dL (ref 0.4–1.8)
Total Protein ELP: 7.8 g/dL (ref 6.0–8.5)

## 2017-02-05 ENCOUNTER — Ambulatory Visit: Payer: Medicare Other | Admitting: Cardiovascular Disease

## 2017-02-05 ENCOUNTER — Ambulatory Visit: Payer: Medicare Other

## 2017-02-05 NOTE — Progress Notes (Deleted)
Patient ID: Derrick Ramos                 DOB: Jul 27, 1945                      MRN: [id number]     HPI: Derrick Ramos is a 71 y.o. male referred by Dr. Allyson Sabal to HTN clinic.  Current HTN meds:  Previously tried:  BP goal:   Family History:   Social History:   Diet:   Exercise:   Home BP readings:   Wt Readings from Last 3 Encounters:  01/19/17 115 lb 11.9 oz (52.5 kg)  12/18/16 124 lb (56.2 kg)  09/13/16 127 lb 6.4 oz (57.8 kg)   BP Readings from Last 3 Encounters:  01/19/17 129/71  12/18/16 (!) 148/80  11/21/16 108/62   Pulse Readings from Last 3 Encounters:  01/19/17 71  12/18/16 73  11/21/16 80    Renal function: CrCl cannot be calculated (Unknown ideal weight.).  Past Medical History:  Diagnosis Date  . Acute kidney injury (HCC)    [name] 11/21/2015  . Atrial fibrillation (HCC)    [name] 11/21/2015  . Dyspnea   . GERD (gastroesophageal reflux disease)   . GI bleed 2016   [name] 11/21/2015  . H/O syncope    recurrent episodes/notes 11/21/2015  . Heart failure (HCC) 08/2016  . Hematemesis    [name] 11/21/2015  . Hepatic cirrhosis (HCC)    [name] 11/21/2015  . History of blood transfusion 2016   related to GI bleed/notes 11/21/2015  . Hypertension   . PVD (peripheral vascular disease) (HCC)    [name] 11/21/2015  . Symptomatic anemia 08/2016  . Syncope and collapse    required staples to back of head/notes 11/21/2015    Current Outpatient Prescriptions on File Prior to Visit  Medication Sig Dispense Refill  . acetaminophen (TYLENOL) 325 MG tablet Take 650 mg by mouth every 6 (six) hours as needed for mild pain.    Marland Kitchen aspirin EC 81 MG EC tablet Take 1 tablet (81 mg total) by mouth daily. 30 tablet 0  . atorvastatin (LIPITOR) 40 MG tablet TAKE 1 TABLET (40 MG TOTAL) BY MOUTH DAILY AT 6 PM. (Patient not taking: Reported on 01/17/2017) 90 tablet 1  . carvedilol (COREG) 12.5 MG tablet Take 1 tablet (12.5 mg total) by mouth 2 (two) times daily with a meal. (Patient not  taking: Reported on 01/17/2017) 60 tablet 1  . iron polysaccharides (NIFEREX) 150 MG capsule Take 1 capsule (150 mg total) by mouth daily. 30 capsule 0  . nitroGLYCERIN (NITROSTAT) 0.4 MG SL tablet Place 1 tablet (0.4 mg total) under the tongue every 5 (five) minutes as needed for chest pain. 25 tablet 3  . pantoprazole (PROTONIX) 40 MG tablet Take 1 tablet (40 mg total) by mouth 2 (two) times daily. (Patient taking differently: Take 40 mg by mouth daily. ) 60 tablet 0  . thiamine (VITAMIN B-1) 100 MG tablet Take 100 mg by mouth daily.     No current facility-administered medications on file prior to visit.     No Known Allergies  There were no vitals taken for this visit.  No problem-specific Assessment & Plan notes found for this encounter.   [name] PharmD, BCPS, CPP [facility] [address] [place] [address] 02/05/2017 2:37 PM

## 2017-02-12 ENCOUNTER — Telehealth: Payer: Self-pay | Admitting: Pharmacist

## 2017-02-12 DIAGNOSIS — I5033 Acute on chronic diastolic (congestive) heart failure: Secondary | ICD-10-CM

## 2017-02-12 DIAGNOSIS — I5020 Unspecified systolic (congestive) heart failure: Secondary | ICD-10-CM

## 2017-02-12 NOTE — Telephone Encounter (Signed)
Patient non compliant with blood work orders and noted multiple cancellations and no shows to clinic appointments.   Noted recent acute care admission for AKI with furosemide and Entresto on HOLD at discharge.    LMOM for patient to call and re-schedule appointment with pharmacist within next 2 weeks and/or repeat BMET as soon as possible to re-assess renal function and fluid status.

## 2017-02-19 ENCOUNTER — Ambulatory Visit: Payer: Medicare Other | Admitting: Cardiovascular Disease

## 2017-03-15 ENCOUNTER — Other Ambulatory Visit: Payer: Self-pay | Admitting: Cardiovascular Disease

## 2017-03-21 ENCOUNTER — Ambulatory Visit (INDEPENDENT_AMBULATORY_CARE_PROVIDER_SITE_OTHER): Payer: Medicare Other | Admitting: Cardiovascular Disease

## 2017-03-21 ENCOUNTER — Encounter: Payer: Self-pay | Admitting: Cardiovascular Disease

## 2017-03-21 DIAGNOSIS — I10 Essential (primary) hypertension: Secondary | ICD-10-CM | POA: Diagnosis not present

## 2017-03-21 DIAGNOSIS — I5020 Unspecified systolic (congestive) heart failure: Secondary | ICD-10-CM | POA: Diagnosis not present

## 2017-03-21 DIAGNOSIS — I480 Paroxysmal atrial fibrillation: Secondary | ICD-10-CM

## 2017-03-21 DIAGNOSIS — Z9861 Coronary angioplasty status: Secondary | ICD-10-CM

## 2017-03-21 DIAGNOSIS — I2510 Atherosclerotic heart disease of native coronary artery without angina pectoris: Secondary | ICD-10-CM | POA: Diagnosis not present

## 2017-03-21 DIAGNOSIS — I70219 Atherosclerosis of native arteries of extremities with intermittent claudication, unspecified extremity: Secondary | ICD-10-CM

## 2017-03-21 NOTE — Progress Notes (Signed)
03/21/2017 [name]   07/25/1945  [id number]  Primary Physician Fleet Contras, MD Primary Cardiologist: Runell Gess MD Nicholes Calamity, [place]  HPI:  Derrick Ramos is a 71 y.o. male thin and frail-appearing married African-American male father of 4, grandfather greater than 10 grandchildren and is accompanied by his daughter Larita Fife today. I last saw him in the office 09/13/16.He has a history of treated hypertension and hyperlipidemia. He also has a history of syncope and has had a Holter monitor that has been unrevealing. He was admitted back in July of last year and had a cardiac catheterization performed by Dr. Katrinka Blazing which showed a high-grade calcified proximal LAD lesion which underwent rotational atherectomy and bare metal stenting by Dr. Sanjuana Kava on 11/29/15. His other problems include a long history of tobacco abuse having recently quit in July of last year. His ongoing alcohol abuse with documented cirrhosis. He has PAF which spontaneously converted during that hospitalization. Anticoagulation was not pursued because of his history of alcohol use and cirrhosis. Recently has had increasing episodic shortness of breath. He also complains of claudication. I obtain Doppler studies on him 08/07/16 revealing a right ABI 0.64 and left ABI 0.9. He did have SFA disease bilaterally, and occluded right popliteal artery as well. In addition, I did stop his Brilenta and transition to Plavix hoping this was the cause of his shortness of breath however it did not affect the quality of his breathing at all. A Myoview stress test was performed on 08/27/16 that showed an ejection fraction of 21% but no ischemia. Based on this he was admitted and underwent right left heart cath by myself on 08/30/16 revealing an EF of 25%, mildly elevated filling pressures and a patent proximal LAD stent. He did have a thoracentesis of 1400 mL of fluid from his right lung was diuresed. He does continue to drink  alcohol. Since I saw him in the office 6 months ago he has been admitted with dehydration and renal failure. His serum creatinine currently is in the 2 range. He does continue to drink 2 beers a day. He denies chest pain.   Current Meds  Medication Sig  . acetaminophen (TYLENOL) 325 MG tablet Take 650 mg by mouth every 6 (six) hours as needed for mild pain.  Marland Kitchen aspirin EC 81 MG EC tablet Take 1 tablet (81 mg total) by mouth daily.  Marland Kitchen atorvastatin (LIPITOR) 40 MG tablet TAKE 1 TABLET (40 MG TOTAL) BY MOUTH DAILY AT 6 PM.  . carvedilol (COREG) 12.5 MG tablet Take 1 tablet (12.5 mg total) by mouth 2 (two) times daily with a meal.  . carvedilol (COREG) 12.5 MG tablet TAKE 1 TABLET (12.5 MG TOTAL) BY MOUTH 2 (TWO) TIMES DAILY WITH A MEAL.  Marland Kitchen iron polysaccharides (NIFEREX) 150 MG capsule Take 1 capsule (150 mg total) by mouth daily.  . nitroGLYCERIN (NITROSTAT) 0.4 MG SL tablet Place 1 tablet (0.4 mg total) under the tongue every 5 (five) minutes as needed for chest pain.  . pantoprazole (PROTONIX) 40 MG tablet Take 1 tablet (40 mg total) by mouth 2 (two) times daily. (Patient taking differently: Take 40 mg by mouth daily. )  . thiamine (VITAMIN B-1) 100 MG tablet Take 100 mg by mouth daily.     No Known Allergies  Social History   Social History  . Marital status: Married    Spouse name: N/A  . Number of children: N/A  . Years of education: N/A   Occupational  History  . Not on file.   Social History Main Topics  . Smoking status: Former Smoker    Packs/day: 0.12    Years: 55.00    Types: Cigarettes    Quit date: 11/21/2015  . Smokeless tobacco: Never Used  . Alcohol use 156.6 oz/week    93 Glasses of wine, 168 Cans of beer per week     Comment: 11/21/2015 daily, 2 - 6 pack tall cans daily beer and gallon wine 2-3 days  . Drug use: No  . Sexual activity: No   Other Topics Concern  . Not on file   Social History Narrative  . No narrative on file     Review of Systems: General:  negative for chills, fever, night sweats or weight changes.  Cardiovascular: negative for chest pain, dyspnea on exertion, edema, orthopnea, palpitations, paroxysmal nocturnal dyspnea or shortness of breath Dermatological: negative for rash Respiratory: negative for cough or wheezing Urologic: negative for hematuria Abdominal: negative for nausea, vomiting, diarrhea, bright red blood per rectum, melena, or hematemesis Neurologic: negative for visual changes, syncope, or dizziness All other systems reviewed and are otherwise negative except as noted above.    Blood pressure (!) 144/28, pulse 70, height 5\' 6"  (1.676 m), weight 128 lb 6.4 oz (58.2 kg).  General appearance: alert and no distress Neck: no adenopathy, no carotid bruit, no JVD, supple, symmetrical, trachea midline and thyroid not enlarged, symmetric, no tenderness/mass/nodules Lungs: clear to auscultation bilaterally Heart: regular rate and rhythm, S1, S2 normal, no murmur, click, rub or gallop Extremities: extremities normal, atraumatic, no cyanosis or edema Pulses: 2+ and symmetric Skin: Skin color, texture, turgor normal. No rashes or lesions Neurologic: Alert and oriented X 3, normal strength and tone. Normal symmetric reflexes. Normal coordination and gait  EKG not performed today  ASSESSMENT AND PLAN:   Atrial fibrillation (HCC) History of asymptomatic A. Fib but not to be an anticoagulation candidate because of cirrhosis and alcohol history.  HTN (hypertension) History of essential hypertension blood pressure measured at 144/28. He is on carvedilol. Continue current meds current dosing  CAD -S/P PCI 11/29/15 History of CAD status post proximal LAD rotational atherectomy and bare-metal stenting by Dr. Clifton JamesMcAlhany  11/29/15. Because of a significantly reduced ejection fraction I performed right and left heart cath on him 08/30/16 revealing an EF of 25%, patent proximal LAD stent with 70% mid LAD lesion which I did not perform  FFR on.he denies chest pain.  Atherosclerotic PVD with intermittent claudication (HCC) History of peripheral arterial disease with a history of SFA disease bilaterally in the right popliteal artery. He does complain of claudication.  Heart failure with reduced ejection fraction (HCC) History of heart failure with reduced ejection fraction and a recent echo performed 08/25/16 revealing an EF in the 20% range.      Runell GessJonathan J. Syenna Nazir MD FACP,FACC,FAHA, [facility] 03/21/2017 4:24 PM

## 2017-03-21 NOTE — Patient Instructions (Signed)

## 2017-03-21 NOTE — Assessment & Plan Note (Signed)
History of peripheral arterial disease with a history of SFA disease bilaterally in the right popliteal artery. He does complain of claudication.

## 2017-03-21 NOTE — Assessment & Plan Note (Signed)
History of heart failure with reduced ejection fraction and a recent echo performed 08/25/16 revealing an EF in the 20% range.

## 2017-03-21 NOTE — Assessment & Plan Note (Signed)
History of CAD status post proximal LAD rotational atherectomy and bare-metal stenting by Dr. Clifton James  11/29/15. Because of a significantly reduced ejection fraction I performed right and left heart cath on him 08/30/16 revealing an EF of 25%, patent proximal LAD stent with 70% mid LAD lesion which I did not perform FFR on.he denies chest pain.

## 2017-03-21 NOTE — Assessment & Plan Note (Signed)
History of essential hypertension blood pressure measured at 144/28. He is on carvedilol. Continue current meds current dosing

## 2017-03-21 NOTE — Assessment & Plan Note (Signed)
History of asymptomatic A. Fib but not to be an anticoagulation candidate because of cirrhosis and alcohol history.

## 2017-03-31 ENCOUNTER — Other Ambulatory Visit: Payer: Self-pay | Admitting: *Deleted

## 2017-03-31 MED ORDER — CARVEDILOL 12.5 MG PO TABS: 12.5000 mg | ORAL_TABLET | Freq: Two times a day (BID) | ORAL | 2 refills | Status: AC

## 2017-03-31 NOTE — Telephone Encounter (Signed)
Rx has been sent to the pharmacy electronically. ° °

## 2017-05-05 IMAGING — CR DG CHEST 2V
2 series · 2 of 2 positions shown · non-contrast
Comparison: Chest CT 11/24/2015 and chest x-ray 11/21/2015

CLINICAL DATA: Leukocytosis. History of cirrhosis. Recurrent
syncopal episodes.

EXAM:
CHEST  2 VIEW

[chest pa]
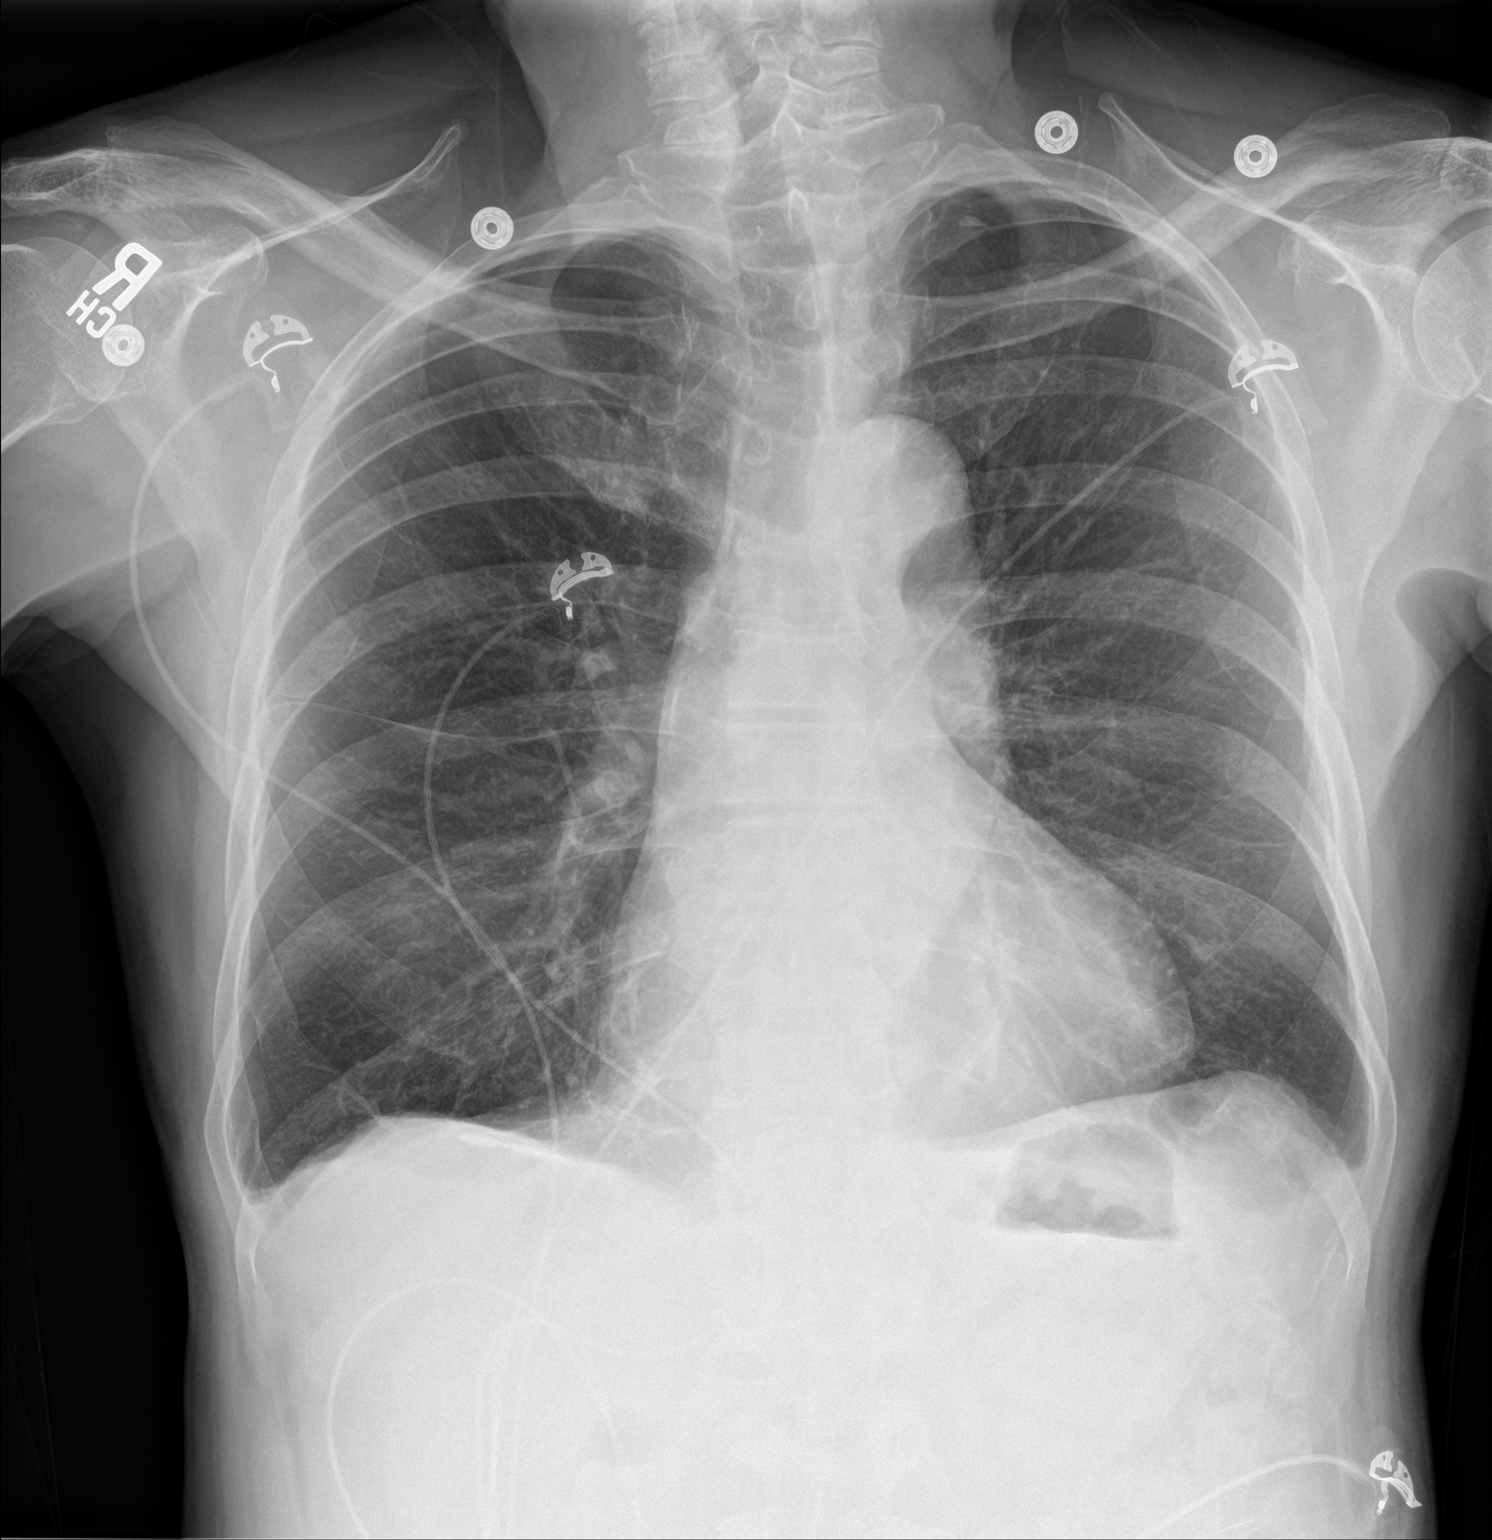

[chest lat]
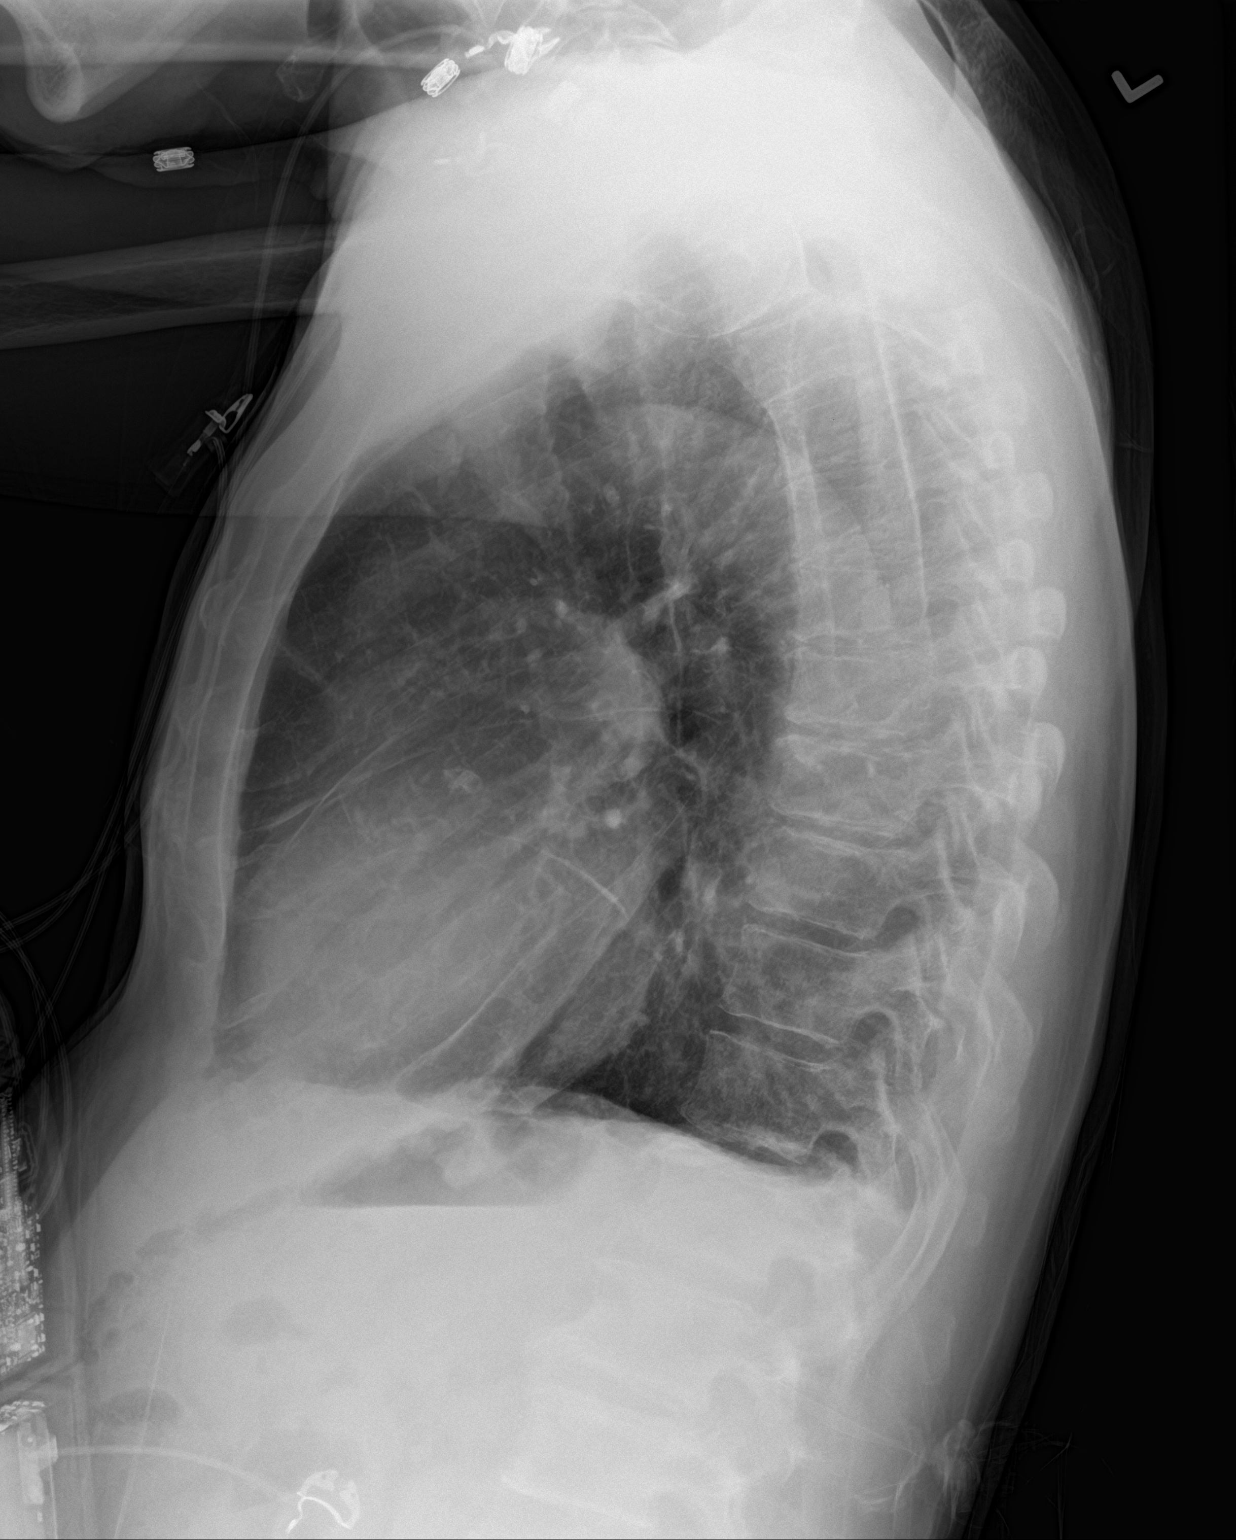

[2 of 2 positions shown; findings below may reference images not displayed]

FINDINGS: The cardiac silhouette, mediastinal and hilar contours are within
normal limits and stable. There is tortuosity and calcification of
the thoracic aorta. Stable bilateral calcified pleural plaques
consistent with asbestos related pleural disease. No acute overlying
pulmonary process. No pleural effusion. The bony thorax is intact.
IMPRESSION: No acute cardiopulmonary findings.

Calcified pleural plaques.

Tortuosity and calcification of the thoracic aorta is stable.

## 2017-05-18 ENCOUNTER — Other Ambulatory Visit: Payer: Self-pay | Admitting: Cardiovascular Disease

## 2018-06-22 IMAGING — CR DG CHEST 2V
2 series · 2 of 2 positions shown · non-contrast
Comparison: 08/28/2016.  Chest CT dated 11/24/2015

CLINICAL DATA: Altered mental status.  Weakness.

EXAM:
CHEST  2 VIEW

[chest pa]
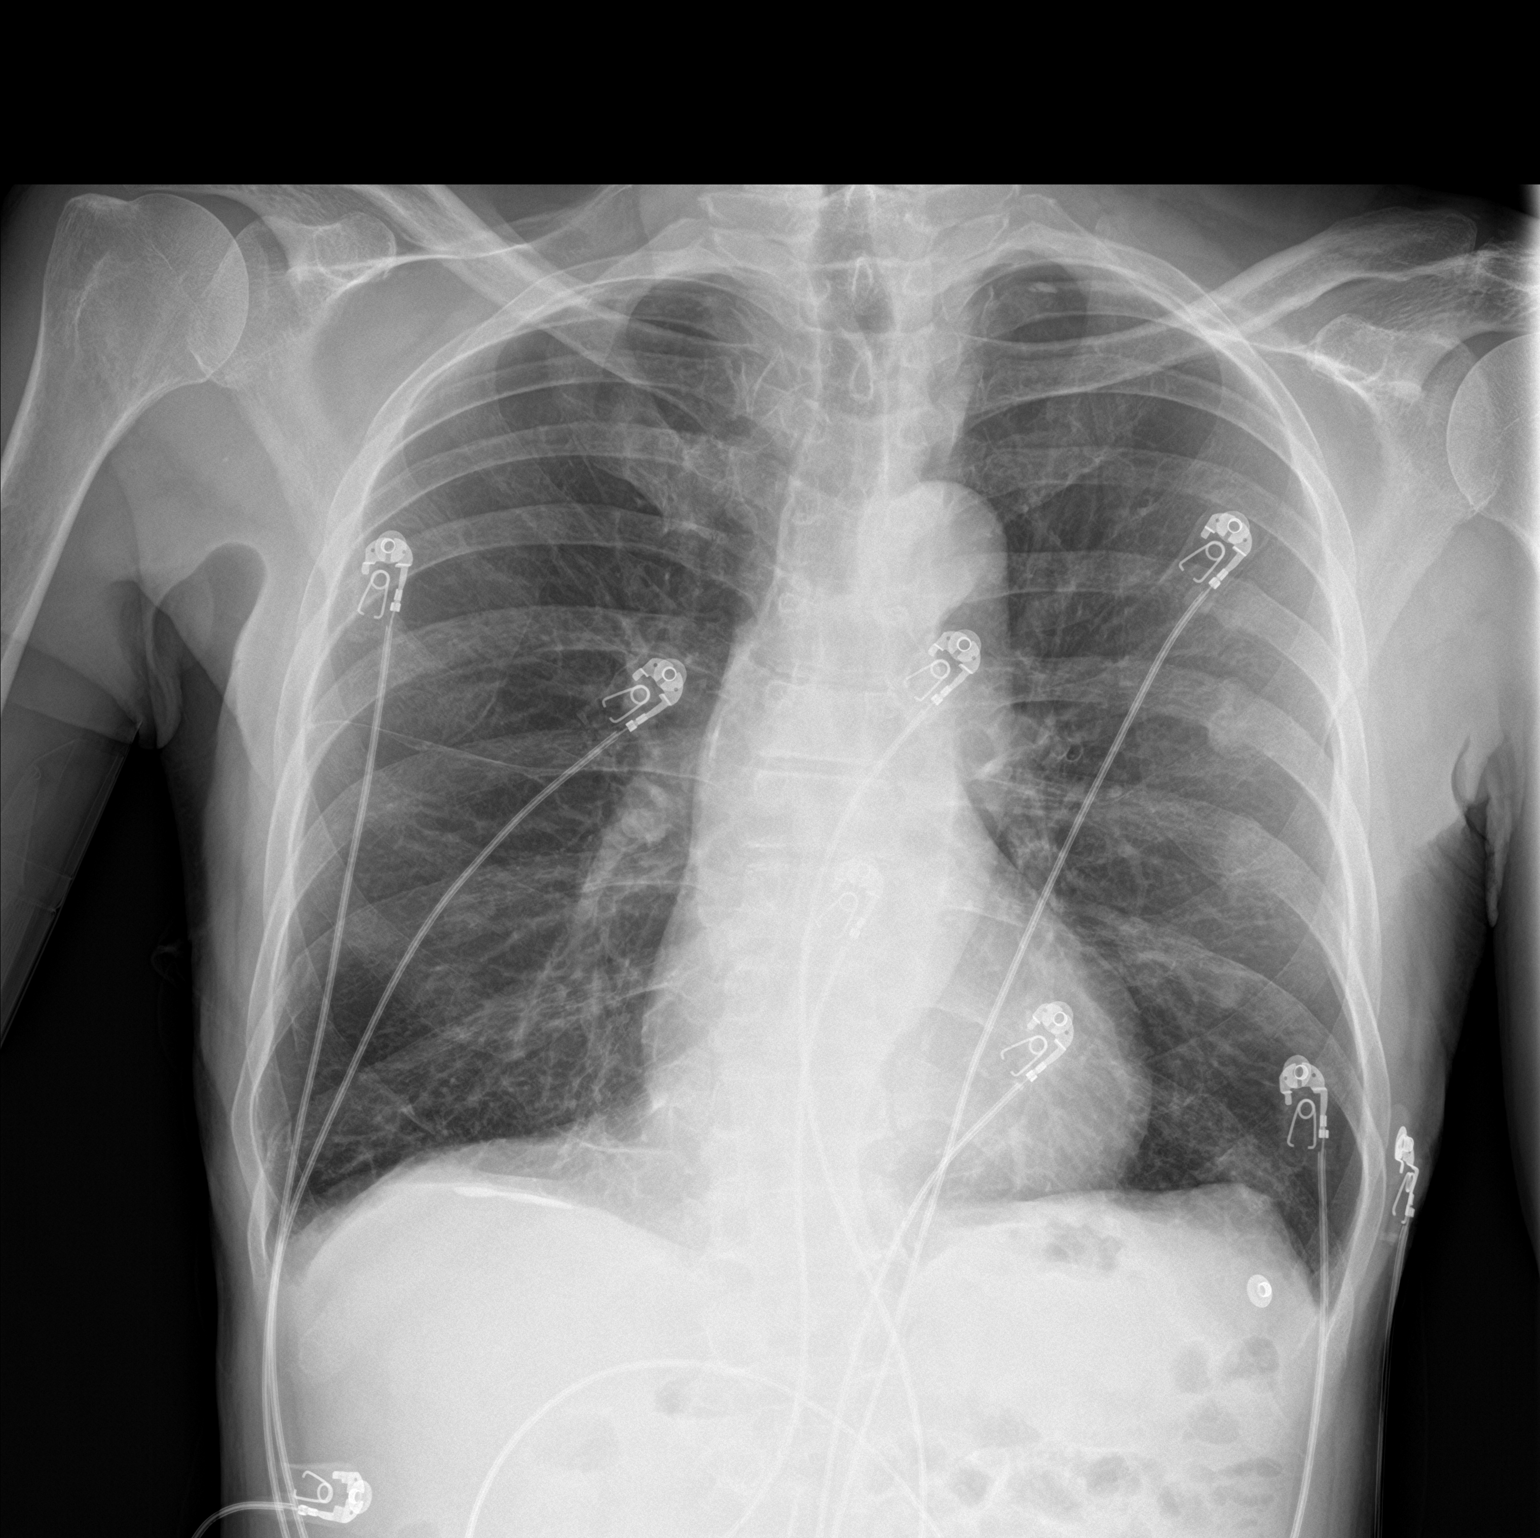

[chest lat]
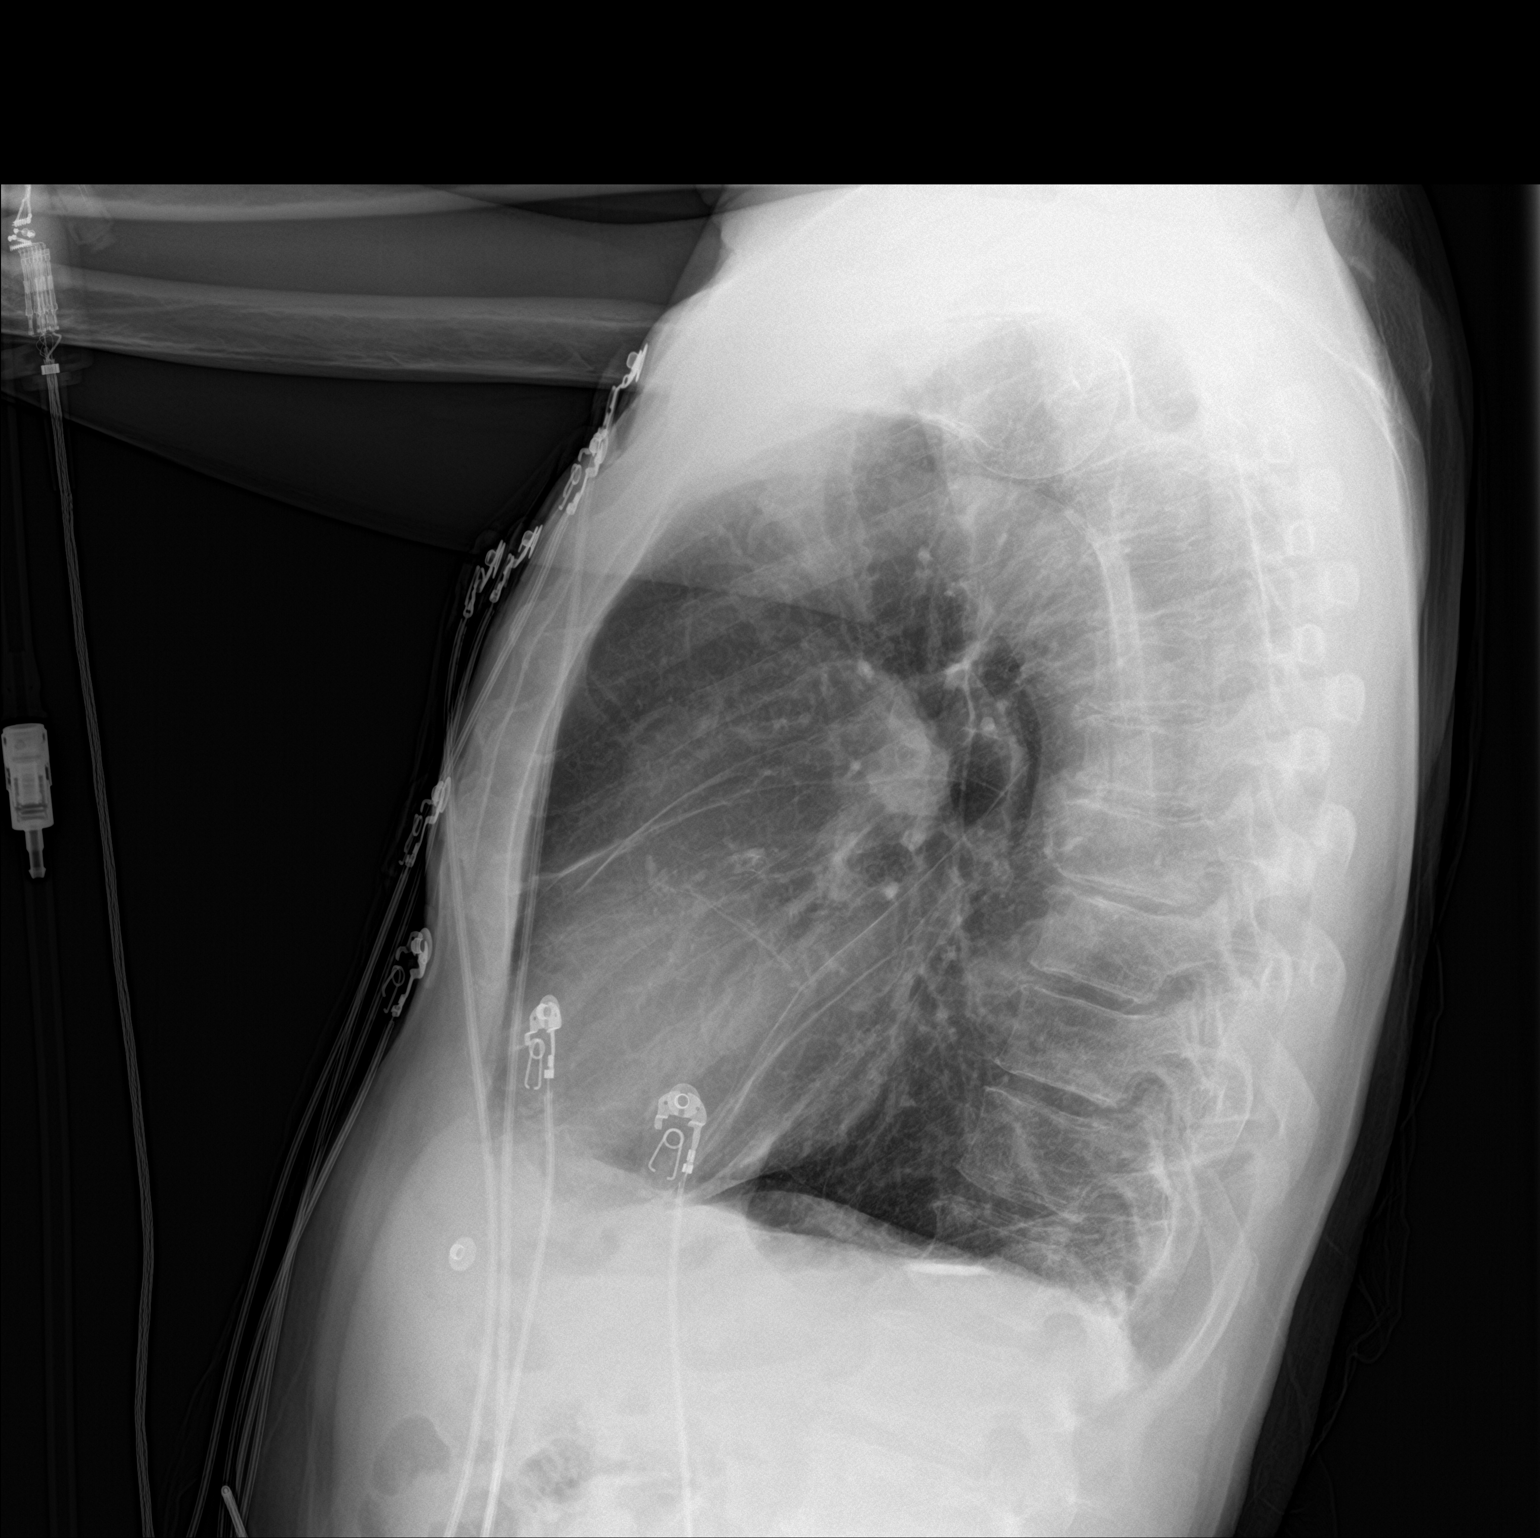

[2 of 2 positions shown; findings below may reference images not displayed]

FINDINGS: Normal sized heart. Tortuous aorta. Clear lungs. The lungs are
hyperexpanded. Mild thoracic spine degenerative changes. Calcified
right diaphragmatic pleural plaque. Old, healed left rib fractures.
IMPRESSION: 1. No acute abnormality.
2. Changes of COPD.
3. Calcified right diaphragmatic pleural plaque, compatible with
previous asbestos exposure.
